# Patient Record
Sex: Male | Born: 1940 | Race: White | Hispanic: No | Marital: Married | Smoking: Former smoker
Health system: Southern US, Community
[De-identification: ages and names within clinical notes are randomized; demographics above are authoritative.]

## PROBLEM LIST (undated history)

## (undated) DIAGNOSIS — R000 Tachycardia, unspecified: Secondary | ICD-10-CM

## (undated) DIAGNOSIS — Q446 Cystic disease of liver: Secondary | ICD-10-CM

## (undated) DIAGNOSIS — I10 Essential (primary) hypertension: Secondary | ICD-10-CM

## (undated) DIAGNOSIS — IMO0001 Reserved for inherently not codable concepts without codable children: Secondary | ICD-10-CM

## (undated) DIAGNOSIS — R160 Hepatomegaly, not elsewhere classified: Secondary | ICD-10-CM

## (undated) DIAGNOSIS — M47816 Spondylosis without myelopathy or radiculopathy, lumbar region: Secondary | ICD-10-CM

## (undated) DIAGNOSIS — M5430 Sciatica, unspecified side: Secondary | ICD-10-CM

## (undated) DIAGNOSIS — Z860100 Personal history of colon polyps, unspecified: Secondary | ICD-10-CM

## (undated) DIAGNOSIS — G4710 Hypersomnia, unspecified: Secondary | ICD-10-CM

## (undated) DIAGNOSIS — J45909 Unspecified asthma, uncomplicated: Secondary | ICD-10-CM

## (undated) DIAGNOSIS — Q613 Polycystic kidney, unspecified: Secondary | ICD-10-CM

## (undated) DIAGNOSIS — K644 Residual hemorrhoidal skin tags: Secondary | ICD-10-CM

## (undated) DIAGNOSIS — E785 Hyperlipidemia, unspecified: Secondary | ICD-10-CM

## (undated) DIAGNOSIS — M722 Plantar fascial fibromatosis: Secondary | ICD-10-CM

## (undated) DIAGNOSIS — E119 Type 2 diabetes mellitus without complications: Secondary | ICD-10-CM

## (undated) DIAGNOSIS — I452 Bifascicular block: Secondary | ICD-10-CM

## (undated) DIAGNOSIS — H9190 Unspecified hearing loss, unspecified ear: Secondary | ICD-10-CM

## (undated) HISTORY — DX: Hyperlipidemia, unspecified: E78.5

## (undated) HISTORY — DX: Essential (primary) hypertension: I10

## (undated) HISTORY — DX: Tachycardia, unspecified: R00.0

## (undated) HISTORY — DX: Type 2 diabetes mellitus without complications: E11.9

## (undated) HISTORY — DX: Bifascicular block: I45.2

## (undated) HISTORY — PX: DENTAL SURGERY: SHX609

## (undated) HISTORY — DX: Hypersomnia, unspecified: G47.10

## (undated) HISTORY — DX: Plantar fascial fibromatosis: M72.2

## (undated) HISTORY — PX: EYE SURGERY: SHX253

## (undated) HISTORY — DX: Unspecified asthma, uncomplicated: J45.909

## (undated) HISTORY — DX: Personal history of colonic polyps: Z86.010

## (undated) HISTORY — DX: Spondylosis without myelopathy or radiculopathy, lumbar region: M47.816

## (undated) HISTORY — PX: CIRCUMCISION: SHX1350

## (undated) HISTORY — DX: Sciatica, unspecified side: M54.30

## (undated) HISTORY — DX: Unspecified hearing loss, unspecified ear: H91.90

## (undated) HISTORY — DX: Reserved for inherently not codable concepts without codable children: IMO0001

## (undated) HISTORY — DX: Residual hemorrhoidal skin tags: K64.4

## (undated) HISTORY — PX: COLONOSCOPY W/ POLYPECTOMY: SHX1380

## (undated) HISTORY — DX: Personal history of colon polyps, unspecified: Z86.0100

## (undated) HISTORY — PX: TRANSTHORACIC ECHOCARDIOGRAM: SHX275

---

## 2008-07-30 DIAGNOSIS — Z860100 Personal history of colon polyps, unspecified: Secondary | ICD-10-CM | POA: Insufficient documentation

## 2010-08-03 ENCOUNTER — Encounter: Payer: Self-pay | Admitting: Otolaryngology

## 2011-05-04 ENCOUNTER — Encounter: Payer: Self-pay | Admitting: Family Medicine

## 2011-05-04 ENCOUNTER — Ambulatory Visit (INDEPENDENT_AMBULATORY_CARE_PROVIDER_SITE_OTHER): Payer: Medicare PPO | Admitting: Family Medicine

## 2011-05-04 ENCOUNTER — Telehealth: Payer: Self-pay | Admitting: Family Medicine

## 2011-05-04 VITALS — BP 157/88 | HR 75 | Temp 98.1°F | Ht 68.5 in | Wt 197.8 lb

## 2011-05-04 DIAGNOSIS — H539 Unspecified visual disturbance: Secondary | ICD-10-CM

## 2011-05-04 DIAGNOSIS — Z23 Encounter for immunization: Secondary | ICD-10-CM

## 2011-05-04 DIAGNOSIS — G4710 Hypersomnia, unspecified: Secondary | ICD-10-CM

## 2011-05-04 DIAGNOSIS — Z125 Encounter for screening for malignant neoplasm of prostate: Secondary | ICD-10-CM

## 2011-05-04 DIAGNOSIS — Z0000 Encounter for general adult medical examination without abnormal findings: Secondary | ICD-10-CM | POA: Insufficient documentation

## 2011-05-04 DIAGNOSIS — I10 Essential (primary) hypertension: Secondary | ICD-10-CM

## 2011-05-04 DIAGNOSIS — R030 Elevated blood-pressure reading, without diagnosis of hypertension: Secondary | ICD-10-CM | POA: Insufficient documentation

## 2011-05-04 HISTORY — DX: Hypersomnia, unspecified: G47.10

## 2011-05-04 MED ORDER — TETANUS-DIPHTH-ACELL PERTUSSIS 5-2.5-18.5 LF-MCG/0.5 IM SUSP: 0.5000 mL | Freq: Once | INTRAMUSCULAR | Status: DC

## 2011-05-04 NOTE — Assessment & Plan Note (Signed)
Reviewed age and gender appropriate health maintenance issues (prudent diet, regular exercise, health risks of tobacco and excessive alcohol, use of seatbelts, fire alarms in home, use of sunscreen).  Also reviewed age and gender appropriate health screening as well as vaccine recommendations. He'll return when fasting for lipid panel, CBC, CMET, TSH, and routine PSA screening test.

## 2011-05-04 NOTE — Patient Instructions (Signed)
Health Maintenance, Males A healthy lifestyle and preventative care can promote health and wellness.  Maintain regular health, dental, and eye exams.   Eat a healthy diet. Foods like vegetables, fruits, whole grains, low-fat dairy products, and lean protein foods contain the nutrients you need without too many calories. Decrease your intake of foods high in solid fats, added sugars, and salt. Get information about a proper diet from your caregiver, if necessary.   Regular physical exercise is one of the most important things you can do for your health. Most adults should get at least 150 minutes of moderate-intensity exercise (any activity that increases your heart rate and causes you to sweat) each week. In addition, most adults need muscle-strengthening exercises on 2 or more days a week.    Maintain a healthy weight. The body mass index (BMI) is a screening tool to identify possible weight problems. It provides an estimate of body fat based on height and weight. Your caregiver can help determine your BMI, and can help you achieve or maintain a healthy weight. For adults 20 years and older:   A BMI below 18.5 is considered underweight.   A BMI of 18.5 to 24.9 is normal.   A BMI of 25 to 29.9 is considered overweight.   A BMI of 30 and above is considered obese.   Maintain normal blood lipids and cholesterol by exercising and minimizing your intake of saturated fat. Eat a balanced diet with plenty of fruits and vegetables. Blood tests for lipids and cholesterol should begin at age 20 and be repeated every 5 years. If your lipid or cholesterol levels are high, you are over 50, or you are a high risk for heart disease, you may need your cholesterol levels checked more frequently.Ongoing high lipid and cholesterol levels should be treated with medicines, if diet and exercise are not effective.   If you smoke, find out from your caregiver how to quit. If you do not use tobacco, do not start.    If you choose to drink alcohol, do not exceed 2 drinks per day. One drink is considered to be 12 ounces (355 mL) of beer, 5 ounces (148 mL) of wine, or 1.5 ounces (44 mL) of liquor.   Avoid use of street drugs. Do not share needles with anyone. Ask for help if you need support or instructions about stopping the use of drugs.   High blood pressure causes heart disease and increases the risk of stroke. Blood pressure should be checked at least every 1 to 2 years. Ongoing high blood pressure should be treated with medicines if weight loss and exercise are not effective.   If you are 45 to 68 years old, ask your caregiver if you should take aspirin to prevent heart disease.   Diabetes screening involves taking a blood sample to check your fasting blood sugar level. This should be done once every 3 years, after age 45, if you are within normal weight and without risk factors for diabetes. Testing should be considered at a younger age or be carried out more frequently if you are overweight and have at least 1 risk factor for diabetes.   Colorectal cancer can be detected and often prevented. Most routine colorectal cancer screening begins at the age of 50 and continues through age 75. However, your caregiver may recommend screening at an earlier age if you have risk factors for colon cancer. On a yearly basis, your caregiver may provide home test kits to check for hidden   blood in the stool. Use of a small camera at the end of a tube, to directly examine the colon (sigmoidoscopy or colonoscopy), can detect the earliest forms of colorectal cancer. Talk to your caregiver about this at age 50, when routine screening begins. Direct examination of the colon should be repeated every 5 to 10 years through age 75, unless early forms of pre-cancerous polyps or small growths are found.   Healthy men should no longer receive prostate-specific antigen (PSA) blood tests as part of routine cancer screening. Consult with  your caregiver about prostate cancer screening.   Practice safe sex. Use condoms and avoid high-risk sexual practices to reduce the spread of sexually transmitted infections (STIs).   Use sunscreen with a sun protection factor (SPF) of 30 or greater. Apply sunscreen liberally and repeatedly throughout the day. You should seek shade when your shadow is shorter than you. Protect yourself by wearing long sleeves, pants, a wide-brimmed hat, and sunglasses year round, whenever you are outdoors.   Notify your caregiver of new moles or changes in moles, especially if there is a change in shape or color. Also notify your caregiver if a mole is larger than the size of a pencil eraser.   A one-time screening for abdominal aortic aneurysm (AAA) and surgical repair of large AAAs by sound wave imaging (ultrasonography) is recommended for ages 65 to 75 years who are current or former smokers.   Stay current with your immunizations.  Document Released: 12/25/2007 Document Revised: 03/10/2011 Document Reviewed: 11/23/2010 [employer] Patient Information 2012 [employer]. 

## 2011-05-04 NOTE — Telephone Encounter (Signed)
Done

## 2011-05-04 NOTE — Progress Notes (Signed)
Office Note 05/05/2011  CC:  Chief Complaint  Patient presents with  . Establish Care    new patient    HPI:  Matthew Ramirez is a 70 y.o. White male who is here to establish care and get CPE. Patient's most recent primary MD: [facility] in [place] Old records were not reviewed prior to or during today's visit.  Pt has not had CPE (or other medical care) in about 3 yrs per his report.  Says after he turned 17 he "got all that medicare stuff", like screening for AAA, various blood tests, colonoscopy, etc.  Says he even saw a cardiologist, although he denies any history of CV dz or sx's. No old records available yet.  Says he feels well.  He reports having odd visual sensations about 6 times in the last few years total: says he has a "kaleidescope" of colors/lights in entire visual field that lasts a few minutes.  No associated sx's, no trigger, no symptoms (such as HA) follows this phenomenon. Thinking is clear during and after.  No hx of CVA/TIA or seizure d/o.  Says he has had multiple high bp readings in the past but these have always been explained away with various excuses and no bp med ever started.  Sounds like he has never had any monitoring/bp check outside of medical setting to establish any more data to support chronic HTN vs labile vs white coat HTN. Admits to hx of snoring and doesn't know if he has any apneic periods.  Has hx of excessive daytime somnolence that he has adjusted to and never thought to look into it medically. Epworth Sleepiness Scale: 10/24.  Past Medical History  Diagnosis Date  . Elevated blood pressure reading without diagnosis of hypertension   . Hearing impairment     Occupational noise damage x years.  Has hearing aids AU  . External hemorrhoids   . History of colon polyps 2009    Past Surgical History  Procedure Date  . Eye surgery     Right (s/p traumatic injury to eye)  . Circumcision age 72  . Colonoscopy 2009    polyps (?type per  pt report)  . Dental surgery     10+ root canals, several bridges    Family History  Problem Relation Age of Onset  . Cancer Mother     leukemia  . Cancer Sister     ovarian    History   Social History  . Marital Status: Married    Spouse Name: N/A    Number of Children: N/A  . Years of Education: N/A   Occupational History  . Not on file.   Social History Main Topics  . Smoking status: Former Smoker -- 1.0 packs/day for 20 years    Types: Cigarettes    Quit date: 07/12/1968  . Smokeless tobacco: Never Used  . Alcohol Use: Yes     a couple of drinks a week  . Drug Use: Not on file  . Sexually Active: Not on file   Other Topics Concern  . Not on file   Social History Narrative   Married, has 4 grown daughters, 15 grandchildren, and 1 great grandchild.Occupation: Radio broadcast assistant.  Originally from [place] [place] [facility] [place], has lived in [place] since age 6 yrs.Distant tobacco abuse: quit 1970.  Rare ETOH intake, no hx of ETOH abuse.  No drug use.Walks about 4 miles per day.   MEDS: Senna once daily No outpatient encounter prescriptions on file as of 05/04/2011.  Facility-Administered Encounter Medications as of 05/04/2011  Medication Dose Route Frequency Provider Last Rate Last Dose  . TDaP (BOOSTRIX) injection 0.5 mL  0.5 mL Intramuscular Once Jeoffrey Massed, MD        No Known Allergies  ROS: as below, plus he complains of less "stamina" than when he was younger Review of Systems  Constitutional: Negative for fever, chills, appetite change and fatigue.  HENT: Negative for ear pain, congestion, sore throat, neck stiffness and dental problem.   Eyes: Positive for visual disturbance (as per HPI). Negative for discharge and redness.  Respiratory: Negative for cough, chest tightness, shortness of breath and wheezing.   Cardiovascular: Negative for chest pain, palpitations and leg swelling.  Gastrointestinal: Positive for constipation and anal bleeding  (occasional BRBPR, dx'd with hemorrhoids in the past--much improved with daily senna). Negative for nausea, vomiting, abdominal pain, diarrhea and blood in stool.  Genitourinary: Negative for dysuria, urgency, frequency, hematuria, flank pain and difficulty urinating.       Erectile dysfunction  Musculoskeletal: Negative for myalgias, back pain, joint swelling and arthralgias.  Skin: Negative for pallor and rash.  Neurological: Negative for dizziness, speech difficulty, weakness and headaches.  Hematological: Negative for adenopathy. Does not bruise/bleed easily.  Psychiatric/Behavioral: Negative for confusion, sleep disturbance and dysphoric mood. The patient is not nervous/anxious.     PE; Blood pressure 157/88, pulse 75, temperature 98.1 F (36.7 C), temperature source Oral, height 5' 8.5" (1.74 m), weight 197 lb 12.8 oz (89.721 kg), SpO2 94.00%. Gen: Alert, well appearing.  Patient is oriented to person, place, time, and situation. HEENT: Scalp without lesions or hair loss.  Ears: EACs clear, normal epithelium.  TMs with good light reflex and landmarks bilaterally.  Eyes: no injection, icteris, swelling, or exudate.  EOMI, pupils reactive bilat.  Right pupil is elliptical shaped (approx 3mm wide x 5 mm high) and left pupil is normal/round and 2 mm in size (this is the result of his past eye trauma and surgery). Nose: no drainage or turbinate edema/swelling.  No injection or focal lesion.  Mouth: lips without lesion/swelling.  Oral mucosa pink and moist.  Dentition intact and without obvious caries or gingival swelling.  Oropharynx without erythema, exudate, or swelling.  necessary Chest: symmetric expansion, nonlabored respirations.  Clear and equal breath sounds in all lung fields.   CV: RRR, no m/r/g.  Peripheral pulses 2+ and symmetric. ABD: soft, NT, ND, BS normal.  No hepatospenomegaly or mass.  No bruits. EXT: no clubbing, cyanosis, or edema.  Genitals normal; both testes normal  without tenderness, masses, hydroceles, varicoceles, erythema or swelling. Shaft normal, circumcised, meatus normal without discharge. No inguinal hernia noted. No inguinal lymphadenopathy. Rectal exam: negative without mass, lesions or tenderness.  Pertinent labs:  None today  ASSESSMENT AND PLAN:   New pt: obtain old records.  Health maintenance examination Reviewed age and gender appropriate health maintenance issues (prudent diet, regular exercise, health risks of tobacco and excessive alcohol, use of seatbelts, fire alarms in home, use of sunscreen).  Also reviewed age and gender appropriate health screening as well as vaccine recommendations. He'll return when fasting for lipid panel, CBC, CMET, TSH, and routine PSA screening test.  Excessive somnolence disorder Arrange sleep study in sleep lab to eval further for OSA.  Elevated blood pressure reading without diagnosis of hypertension Discussed need to do monitoring of bp outside of medical setting.  Discussed buying home bp cuff vs getting bp checked at local pharmacy a few times per week.  Review bps with me at office visit in 2 wks.  Visual disturbance Could be related to past eye trauma but also wonder about this being TIA or occipital lobe seizures. Discussed with patient today and decided that we'll likely ask neuro to see him in the future but will hold off for now. Call or return if this becomes more frequent, longer in duration, or starts having other symptoms associated with it.     Return in about 2 weeks (around 05/18/2011), or o/v for f/u High bp reading, for lab visit for fasting blood work (already ordered) any time in the next 1 wk.

## 2011-05-04 NOTE — Telephone Encounter (Signed)
Pls request records from [facility].  Thx--PM

## 2011-05-05 ENCOUNTER — Other Ambulatory Visit: Payer: Self-pay | Admitting: Family Medicine

## 2011-05-05 DIAGNOSIS — H539 Unspecified visual disturbance: Secondary | ICD-10-CM | POA: Insufficient documentation

## 2011-05-05 NOTE — Assessment & Plan Note (Signed)
Arrange sleep study in sleep lab to eval further for OSA.

## 2011-05-05 NOTE — Progress Notes (Signed)
A user error has taken place: encounter opened in error, closed for administrative reasons.

## 2011-05-05 NOTE — Assessment & Plan Note (Signed)
Could be related to past eye trauma but also wonder about this being TIA or occipital lobe seizures. Discussed with patient today and decided that we'll likely ask neuro to see him in the future but will hold off for now. Call or return if this becomes more frequent, longer in duration, or starts having other symptoms associated with it.

## 2011-05-05 NOTE — Assessment & Plan Note (Signed)
Discussed need to do monitoring of bp outside of medical setting.  Discussed buying home bp cuff vs getting bp checked at local pharmacy a few times per week. Review bps with me at office visit in 2 wks.

## 2011-05-06 NOTE — Progress Notes (Signed)
Addended by: Andrew Au on: 05/06/2011 01:14 PM   Modules accepted: Orders

## 2011-05-10 ENCOUNTER — Ambulatory Visit (HOSPITAL_BASED_OUTPATIENT_CLINIC_OR_DEPARTMENT_OTHER): Payer: Medicare PPO

## 2011-05-11 ENCOUNTER — Other Ambulatory Visit (INDEPENDENT_AMBULATORY_CARE_PROVIDER_SITE_OTHER): Payer: Medicare PPO

## 2011-05-11 DIAGNOSIS — Z125 Encounter for screening for malignant neoplasm of prostate: Secondary | ICD-10-CM

## 2011-05-11 DIAGNOSIS — Z23 Encounter for immunization: Secondary | ICD-10-CM

## 2011-05-11 DIAGNOSIS — Z0000 Encounter for general adult medical examination without abnormal findings: Secondary | ICD-10-CM

## 2011-05-11 DIAGNOSIS — I10 Essential (primary) hypertension: Secondary | ICD-10-CM

## 2011-05-11 LAB — COMPREHENSIVE METABOLIC PANEL WITH GFR
ALT: 26 U/L (ref 0–53)
AST: 23 U/L (ref 0–37)
Albumin: 4.1 g/dL (ref 3.5–5.2)
Alkaline Phosphatase: 78 U/L (ref 39–117)
BUN: 17 mg/dL (ref 6–23)
CO2: 29 meq/L (ref 19–32)
Calcium: 9 mg/dL (ref 8.4–10.5)
Chloride: 102 meq/L (ref 96–112)
Creatinine, Ser: 0.6 mg/dL (ref 0.4–1.5)
GFR: 142.22 mL/min (ref >60.00)
Glucose, Bld: 128 mg/dL — ABNORMAL HIGH (ref 70–99)
Potassium: 4.3 meq/L (ref 3.5–5.1)
Sodium: 138 meq/L (ref 135–145)
Total Bilirubin: 0.7 mg/dL (ref 0.3–1.2)
Total Protein: 7 g/dL (ref 6.0–8.3)

## 2011-05-11 LAB — LIPID PANEL
Cholesterol: 199 mg/dL (ref 0–200)
HDL: 45.9 mg/dL (ref >39.00)
LDL Cholesterol: 130 mg/dL — ABNORMAL HIGH (ref 0–99)
Total CHOL/HDL Ratio: 4
Triglycerides: 114 mg/dL (ref 0.0–149.0)
VLDL: 22.8 mg/dL (ref 0.0–40.0)

## 2011-05-11 LAB — CBC WITH DIFFERENTIAL/PLATELET
Basophils Absolute: 0 10*3/uL (ref 0.0–0.1)
HCT: 44.5 % (ref 39.0–52.0)
Hemoglobin: 14.9 g/dL (ref 13.0–17.0)
Lymphs Abs: 1.6 10*3/uL (ref 0.7–4.0)
MCV: 92.4 fl (ref 78.0–100.0)
Monocytes Absolute: 0.6 10*3/uL (ref 0.1–1.0)
Neutro Abs: 4.6 10*3/uL (ref 1.4–7.7)
Platelets: 162 10*3/uL (ref 150.0–400.0)
RDW: 13.7 % (ref 11.5–14.6)

## 2011-05-11 LAB — TSH: TSH: 1.8 u[IU]/mL (ref 0.35–5.50)

## 2011-05-11 LAB — PSA: PSA: 0.55 ng/mL (ref 0.10–4.00)

## 2011-05-12 ENCOUNTER — Ambulatory Visit (HOSPITAL_BASED_OUTPATIENT_CLINIC_OR_DEPARTMENT_OTHER): Payer: Medicare PPO | Attending: Family Medicine

## 2011-05-12 ENCOUNTER — Encounter: Payer: Self-pay | Admitting: Family Medicine

## 2011-05-12 ENCOUNTER — Other Ambulatory Visit: Payer: Self-pay | Admitting: Family Medicine

## 2011-05-12 DIAGNOSIS — G4733 Obstructive sleep apnea (adult) (pediatric): Secondary | ICD-10-CM | POA: Insufficient documentation

## 2011-05-13 DIAGNOSIS — E119 Type 2 diabetes mellitus without complications: Secondary | ICD-10-CM

## 2011-05-13 HISTORY — DX: Type 2 diabetes mellitus without complications: E11.9

## 2011-05-13 LAB — HEMOGLOBIN A1C: Hgb A1c MFr Bld: 6.9 % — ABNORMAL HIGH (ref 4.6–6.5)

## 2011-05-18 ENCOUNTER — Ambulatory Visit (INDEPENDENT_AMBULATORY_CARE_PROVIDER_SITE_OTHER): Payer: Medicare PPO | Admitting: Family Medicine

## 2011-05-18 ENCOUNTER — Encounter: Payer: Self-pay | Admitting: Family Medicine

## 2011-05-18 VITALS — BP 176/88 | HR 74 | Temp 98.3°F | Ht 68.5 in | Wt 198.8 lb

## 2011-05-18 DIAGNOSIS — E785 Hyperlipidemia, unspecified: Secondary | ICD-10-CM | POA: Insufficient documentation

## 2011-05-18 DIAGNOSIS — I10 Essential (primary) hypertension: Secondary | ICD-10-CM | POA: Insufficient documentation

## 2011-05-18 DIAGNOSIS — E119 Type 2 diabetes mellitus without complications: Secondary | ICD-10-CM

## 2011-05-18 MED ORDER — LISINOPRIL 5 MG PO TABS: 5.0000 mg | ORAL_TABLET | Freq: Every day | ORAL | Status: DC

## 2011-05-18 MED ORDER — ONETOUCH DELICA LANCETS MISC: Status: AC

## 2011-05-18 NOTE — Assessment & Plan Note (Signed)
Lab Results  Component Value Date   CHOL 199 05/11/2011   HDL 45.90 05/11/2011   LDLCALC 130* 05/11/2011   TRIG 114.0 05/11/2011   CHOLHDL 4 05/11/2011   Goal LDL <100.  He is a bit shell shocked about recent medical dx and was already hesitant to resort to meds for treatment of his cholesterol.  We'll have to re-address this at future visits and I'll be recommending a statin unless his lifestyle changes make a remarkable impact.Marland Kitchen

## 2011-05-18 NOTE — Progress Notes (Signed)
OFFICE NOTE  05/18/2011  CC:  Chief Complaint  Patient presents with  . Hypertension    follow up     HPI:   Patient is a 70 y.o. Caucasian male who is here for 2 wk f/u HTN, plus labs recently showed HbA1c 6.9%. Approx 7 bp [facility] since [facility] visit show some bps high normal and some in mild HTN range  Has checked fasting glucoses last 3 mornings and they have all been 130s-140s. He feels good, no acute complaints. Says he is frustrated with recent dx of HTN and DM 2 but is motivated to do whatever it takes to get things controlled, including nutritionist referral and starting regular exercise.  Pertinent PMH:  Past Medical History  Diagnosis Date  . Elevated blood pressure reading without diagnosis of hypertension   . Hearing impairment     Occupational noise damage x years.  Has hearing aids AU  . External hemorrhoids   . History of colon polyps 2009  . DM (diabetes mellitus) 05/2011    MEDS;  NONE No outpatient prescriptions prior to visit.    PE: Blood pressure 176/88, pulse 74, temperature 98.3 F (36.8 C), temperature source Oral, height 5' 8.5" (1.74 m), weight 198 lb 12.8 oz (90.175 kg), SpO2 92.00%. Recheck of bp in exam room, right arm: 162/88.   Gen: Alert, well appearing.  Patient is oriented to person, place, time, and situation. No further exam today.  LABS:  Lab Results  Component Value Date   TSH 1.80 05/11/2011   Lab Results  Component Value Date   WBC 7.2 05/11/2011   HGB 14.9 05/11/2011   HCT 44.5 05/11/2011   MCV 92.4 05/11/2011   PLT 162.0 05/11/2011   Lab Results  Component Value Date   CREATININE 0.6 05/11/2011   BUN 17 05/11/2011   NA 138 05/11/2011   K 4.3 05/11/2011   CL 102 05/11/2011   CO2 29 05/11/2011   Lab Results  Component Value Date   ALT 26 05/11/2011   AST 23 05/11/2011   ALKPHOS 78 05/11/2011   BILITOT 0.7 05/11/2011   Lab Results  Component Value Date   CHOL 199 05/11/2011   Lab  Results  Component Value Date   HDL 45.90 05/11/2011   Lab Results  Component Value Date   LDLCALC 130* 05/11/2011   Lab Results  Component Value Date   TRIG 114.0 05/11/2011   Lab Results  Component Value Date   CHOLHDL 4 05/11/2011   Lab Results  Component Value Date   PSA 0.55 05/11/2011   Lab Results  Component Value Date   HGBA1C 6.9* 05/11/2011       IMPRESSION AND PLAN:  DM (diabetes mellitus) Discussed dx, he's letting it sink in a bit. He agreed to a nutritionist referral.  No meds at this time. We'll have ongoing discussion about regular physical exercise, appropriate wt loss.   Discussed monitoring: check bid for now--one fasting and one 2H PP daily. In future f/u's we'll go over the routine monitoring for diabetics like eye exams, HbA1c's, foot exams, urine protein checks.  HTN (hypertension), benign Start lisinopril 5mg  qd.  Therapeutic expectations and side effect profile of medication discussed today.  Patient's questions answered. Continue to monitor bp when he can.  Goal bp <130/80.  Hyperlipidemia Lab Results  Component Value Date   CHOL 199 05/11/2011   HDL 45.90 05/11/2011   LDLCALC 130* 05/11/2011   TRIG 114.0 05/11/2011   CHOLHDL  4 05/11/2011   Goal LDL <100.  He is a bit shell shocked about recent medical dx and was already hesitant to resort to meds for treatment of his cholesterol.  We'll have to re-address this at future visits and I'll be recommending a statin unless his lifestyle changes make a remarkable impact..     FOLLOW UP:  Return in about 2 weeks (around 06/01/2011) for f/u HTN and DM 2.

## 2011-05-18 NOTE — Assessment & Plan Note (Signed)
Start lisinopril 5mg  qd.  Therapeutic expectations and side effect profile of medication discussed today.  Patient's questions answered. Continue to monitor bp when he can.  Goal bp <130/80.

## 2011-05-18 NOTE — Assessment & Plan Note (Signed)
Discussed dx, he's letting it sink in a bit. He agreed to a nutritionist referral.  No meds at this time. We'll have ongoing discussion about regular physical exercise, appropriate wt loss.   Discussed monitoring: check bid for now--one fasting and one 2H PP daily. In future f/u's we'll go over the routine monitoring for diabetics like eye exams, HbA1c's, foot exams, urine protein checks.

## 2011-05-20 ENCOUNTER — Encounter: Payer: Medicare PPO | Attending: Family Medicine | Admitting: *Deleted

## 2011-05-20 ENCOUNTER — Encounter: Payer: Self-pay | Admitting: *Deleted

## 2011-05-20 DIAGNOSIS — Z9884 Bariatric surgery status: Secondary | ICD-10-CM | POA: Insufficient documentation

## 2011-05-20 DIAGNOSIS — Z713 Dietary counseling and surveillance: Secondary | ICD-10-CM | POA: Insufficient documentation

## 2011-05-20 DIAGNOSIS — E119 Type 2 diabetes mellitus without complications: Secondary | ICD-10-CM | POA: Insufficient documentation

## 2011-05-20 DIAGNOSIS — I10 Essential (primary) hypertension: Secondary | ICD-10-CM

## 2011-05-20 NOTE — Patient Instructions (Signed)
Goals:  Follow Diabetes Meal Plan as instructed  Eat 2 meals and 1-2 snacks daily  Limit carbohydrate intake to 45-60 grams carbohydrate/meal  Include lean protein foods to meals/snacks  Monitor glucose levels as instructed by your doctor  Aim for 30 mins of physical activity daily  Bring food record and glucose log to your next nutrition visit

## 2011-05-20 NOTE — Progress Notes (Signed)
  Medical Nutrition Therapy:  Appt start time: 0830 end time:  0930.   Assessment:  Primary concerns today: Nutrition counseling for newly diagnosed Type 2 diabetes and HTN. Patient states he is still quite upset with these diagnosis as he has never had anything wrong with him and takes no Rx medications. He normally eats 2 meals a day and is active throughout the day.   MEDICATIONS: Lisinopril, garlic pill and laxative   DIETARY INTAKE:  Usual eating pattern includes 2 meals and no snacks per day.  Everyday foods include good variety of all food groups.  Avoided foods include canned foods, desserts and salt at the table.    24-hr recall:  B ( AM): eggs, cheese, meat. No starch.  Snk ( AM): none  L ( PM): skips Snk ( PM): none D ( PM): lean meat, vegetables, dried beans. Occasional beer Snk ( PM): none Beverages: black coffee, water, beer at supper  Usual physical activity: yard work, active lifestyle  Estimated energy needs: 1600 calories 180 g carbohydrates 120 g protein 44 g fat  Progress Towards Goal(s):  In progress.   Nutritional Diagnosis:  [place]-3.3 Overweight/obesity As related to new diagnosis of Type 2 diabetes.  As evidenced by BMI of 29.1.    Intervention:  Nutrition counseling and diabetes self management provided with explanation of physiology of diabetes, factors affecting BG, value of consistent carb intake and self monitoring of his BG both pre and post meal.  Handouts given during visit include:  Living with Type 2 Diabetes by ADA  Carb Counting and Beyond handout  Monitoring/Evaluation:  Dietary intake, exercise, self monitoring of his BG, and body weight prn.

## 2011-06-01 ENCOUNTER — Telehealth: Payer: Self-pay | Admitting: Family Medicine

## 2011-06-01 ENCOUNTER — Encounter: Payer: Self-pay | Admitting: Family Medicine

## 2011-06-01 ENCOUNTER — Ambulatory Visit (INDEPENDENT_AMBULATORY_CARE_PROVIDER_SITE_OTHER): Payer: Medicare PPO | Admitting: Family Medicine

## 2011-06-01 VITALS — BP 156/80 | HR 52 | Ht 68.5 in | Wt 194.0 lb

## 2011-06-01 DIAGNOSIS — S43429A Sprain of unspecified rotator cuff capsule, initial encounter: Secondary | ICD-10-CM

## 2011-06-01 DIAGNOSIS — S46019A Strain of muscle(s) and tendon(s) of the rotator cuff of unspecified shoulder, initial encounter: Secondary | ICD-10-CM | POA: Insufficient documentation

## 2011-06-01 DIAGNOSIS — I10 Essential (primary) hypertension: Secondary | ICD-10-CM

## 2011-06-01 DIAGNOSIS — G4733 Obstructive sleep apnea (adult) (pediatric): Secondary | ICD-10-CM

## 2011-06-01 DIAGNOSIS — E119 Type 2 diabetes mellitus without complications: Secondary | ICD-10-CM

## 2011-06-01 NOTE — Assessment & Plan Note (Signed)
Mild, R>L. Discussed relative rest, avoid prolonged abduction position (esp when sleeping). Do gentle ROM to keep from getting frozen shoulder. If no improvement with the topical preparation I rx'd for him today (3% diclofenac, 2% baclofen, 1% bupivicaine, 6% gabapentin, apply 1-2g to affected area 3-4 times per day, he can return and I'll offer intraarticular steriod injection and/or PT.

## 2011-06-01 NOTE — Telephone Encounter (Signed)
Patient wanted to check to see if he could pick up the anti-inflammatory cream Rx

## 2011-06-01 NOTE — Assessment & Plan Note (Addendum)
Doing fine with things so far: continue qd monitoring of gluc the best he can. Continue to adjust diet, get more physically active.  Recheck HbA1c 57mo. I asked him to call his ophthalmologist, Dr. Nile Riggs, to arrange his first diab retin screening exam between now and next f/u in 57mo. Foot exam today was pretty normal.  He has a bit of diminished sensation in both heels where he has some callus build up.

## 2011-06-01 NOTE — Progress Notes (Signed)
OFFICE NOTE  06/01/2011  CC:  Chief Complaint  Patient presents with  . Diabetes    follow up  . Hypertension    follow up     HPI:   Patient is a 70 y.o. Caucasian male who is here for f/u DM 2 and HTN. Has been on lisinopril 5mg  x 2 wks.  Home/pharmacy bp checks consistently <140/90. NO side effects from med. Checks fasting gluc most mornings and gets 130s-150s usually.  He went to the nutritionist and did find this helpful. Plans on starting exercising soon, too. He is determined to try his hardest to control things well enough with lifestyle mod in order to avoid diabetes meds. Denies pain, tingling, or numbness in feet.  Has some callus and a few plantar warts on right heel, bother him when walking on them. Asks for something to be done about them if possible. Discussed some of the routine diabetic monitoring: HbA1c 4 times per year avg, annual foot exams, annual diab retin screening exam, annual urine microalb.  Pt c/o a few weeks of right>left shoulder/upper arm pain/ache ("ever since I got that tetanus shot here".  He is a Nutritional therapist, does repetitive overhead reaching frequently, also sleeps with arm in abduction much of the time.  Present some at rest but made worse when abducting his arms (esp between 30 and 120 degrees).  No neck pain, no shooting pain down arms and no paresthesias.  No hx of shoulder or neck trauma.  No hx of shoulder x-ray in past or shoulder injection in the past.  Has been taking a homeopathic type med called Joint Advantage lately, unsure if helping or not.  Intensity of pain is 2-3/10 most of the time, with occasional twinges of 8/10 intensity.  He declined the flu vaccine today.    Pertinent PMH:  DM 2 HTN Hx of hyperlipidemia Excessive somnolence disorder: sleep study (in lab) done approx 2 wks ago and no result received yet.  MEDS;   Outpatient Prescriptions Prior to Visit  Medication Sig Dispense Refill  . lisinopril (PRINIVIL,ZESTRIL) 5 MG  tablet Take 1 tablet (5 mg total) by mouth daily.  30 tablet  1  . ONETOUCH DELICA LANCETS MISC Check glucose twice daily  60 each  5    PE: Blood pressure 156/80, pulse 52, height 5' 8.5" (1.74 m), weight 194 lb (87.998 kg). Gen: Alert, well appearing.  Patient is oriented to person, place, time, and situation. Feet: full diabetic foot exam done.  Feet are normal except some callus on heels and the right heel has 3 distinct areas of hyperkeratotic papules most c/w plantar warts.  No erythema or tenderness. Shoulders: no deformity.  Pain with abduction from 30 deg to 120 deg or so, negative drop sign.  ER/IR not painful.  Strength 5/5 prox and dist in both LE's  DTRs 1+ in biceps and triceps. No tenderness to palpation anywhere along shoulder girdle on either side.  IMPRESSION AND PLAN:  DM (diabetes mellitus) Doing fine with things so far: continue qd monitoring of gluc the best he can. Continue to adjust diet, get more physically active.  Recheck HbA1c 76mo. I asked him to call his ophthalmologist, Dr. Nile Riggs, to arrange his first diab retin screening exam between now and next f/u in 76mo. Foot exam today was pretty normal.  He has a bit of diminished sensation in both heels where he has some callus build up.  HTN (hypertension), benign Problem stable.  Continue current medications and diet  appropriate for this condition.  We have reviewed our general long term plan for this problem and also reviewed symptoms and signs that should prompt the patient to call or return to the office.   Rotator cuff strain Mild, R>L. Discussed relative rest, avoid prolonged abduction position (esp when sleeping). Do gentle ROM to keep from getting frozen shoulder. If no improvement with the topical preparation I rx'd for him today (3% diclofenac, 2% baclofen, 1% bupivicaine, 6% gabapentin, apply 1-2g to affected area 3-4 times per day, he can return and I'll offer intraarticular steriod injection and/or  PT.      FOLLOW UP:  Return for f/u dm 2, HTN, rotator cuff.

## 2011-06-01 NOTE — Assessment & Plan Note (Signed)
Problem stable.  Continue current medications and diet appropriate for this condition.  We have reviewed our general long term plan for this problem and also reviewed symptoms and signs that should prompt the patient to call or return to the office.  

## 2011-06-02 NOTE — Telephone Encounter (Signed)
Advised RX is through mail order company.  Pt states they called him last night and everything is in process.  No further questions.

## 2011-06-06 NOTE — Procedures (Addendum)
NAME:  Matthew Ramirez, Matthew Ramirez NO.:  [id number]  MEDICAL RECORD NO.:  [id number]         PATIENT TYPE:  OUT  LOCATION:  SLEEP CENTER                 FACILITY:  [facility]  PHYSICIAN:  Barbaraann Share, MD,FCCPDATE OF BIRTH:  [birth year] 05, 1941  DATE OF STUDY:  05/12/2011                           NOCTURNAL POLYSOMNOGRAM  REFERRING PHYSICIAN:  Jeoffrey Massed, MD  INDICATION FOR STUDY:  Hypersomnia with sleep apnea.  EPWORTH SLEEPINESS SCORE:  10.  MEDICATIONS:  SLEEP ARCHITECTURE:  The patient had a total sleep time of 320 minutes, with decreased slow wave sleep and 83 minutes of REM, which is at the lower limits of normal.  Sleep onset latency was normal at 5 minutes, and REM onset was normal at 71 minutes.  Sleep efficiency was mildly reduced at 89%.  RESPIRATORY DATA:  The patient was found to have 5 obstructive apneas and 42 obstructive hypopneas during the night, giving him an apnea- hypopnea index of 9 events per hour over the entire study.  The events were increased during REM, but were not positional.  There was moderate to loud snoring noted throughout.  The patient did not meet split night protocol, secondary to the majority of his events occurring well after 1 a.m.  OXYGEN DATA:  There was transient O2 desaturation as low as 85% with the patient's obstructive events.  CARDIAC DATA:  Occasional PAC noted, but no clinically significant arrhythmias were seen.  MOVEMENT-PARASOMNIA:  The patient had no significant leg jerks or other abnormal behaviors noted.  IMPRESSIONS-RECOMMENDATIONS:  Mild obstructive sleep apnea/hypopnea syndrome with an AHI of 9 events per hour, and O2 desaturation transiently as low as 85%.  Treatment for this degree of sleep apnea can include a trial of weight loss alone, upper airway surgery, dental appliance, and also CPAP.  Because of the mild nature of his sleep apnea, the decision to treat should be based on its impact to his quality  of life.  Clinical correlation is suggested.     Barbaraann Share, MD,FCCP Diplomate, American Board of Sleep Medicine Electronically Signed    [facility]/MED[facility]  D:  06/01/2011 08:53:22  T:  06/01/2011 09:15:19  Job:  [id number]

## 2011-06-07 ENCOUNTER — Encounter: Payer: Self-pay | Admitting: Family Medicine

## 2011-06-07 ENCOUNTER — Telehealth: Payer: Self-pay | Admitting: Family Medicine

## 2011-06-07 ENCOUNTER — Ambulatory Visit (INDEPENDENT_AMBULATORY_CARE_PROVIDER_SITE_OTHER): Payer: Medicare PPO | Admitting: Family Medicine

## 2011-06-07 VITALS — BP 138/75 | HR 64 | Ht 68.5 in | Wt 195.0 lb

## 2011-06-07 DIAGNOSIS — I10 Essential (primary) hypertension: Secondary | ICD-10-CM

## 2011-06-07 DIAGNOSIS — B070 Plantar wart: Secondary | ICD-10-CM

## 2011-06-07 DIAGNOSIS — E119 Type 2 diabetes mellitus without complications: Secondary | ICD-10-CM

## 2011-06-07 DIAGNOSIS — G4710 Hypersomnia, unspecified: Secondary | ICD-10-CM

## 2011-06-07 LAB — MICROALBUMIN / CREATININE URINE RATIO
Creatinine,U: 44.2 mg/dL
Microalb Creat Ratio: 0.7 mg/g (ref 0.0–30.0)

## 2011-06-07 NOTE — Assessment & Plan Note (Addendum)
Diet only at this point. Discussed more educational aspects of the dz since he is a new dx: emphasized carb restriction but NOT elimination, portion control, frequent small meals, continue to monitor glucose 1-2 times per day.  Encouraged pt to start exercise regimen; start slow and work up to 30 min of cardio 5/7 days per week. Exercise effects on glucose discussed.  Discussed diabetic foot care/precautions. Discussed potential effects of diabetes on kidneys, obtained urine for microalb/cr today. He is working on scheduling his first Diab retin screening exam already. I need to get him on one 81mg  ASA qd for primary prevention of CV dz. F/u as already scheduled in a few months.

## 2011-06-07 NOTE — Assessment & Plan Note (Signed)
Shaved callus over these today and then used cryotherapy to freeze 6 plantar warts.  No complications.  Pt tolerated the procedure well. Call or return if not resolving in 2-3 wks and if still causing discomfort with walking.

## 2011-06-07 NOTE — Assessment & Plan Note (Signed)
Problem stable.  Continue current medications and diet appropriate for this condition.  We have reviewed our general long term plan for this problem and also reviewed symptoms and signs that should prompt the patient to call or return to the office.  

## 2011-06-07 NOTE — Assessment & Plan Note (Signed)
Sleep study in sleep lab has been completed and preliminary report from Dr. Shelle Iron was mild OSA but he did not sound convinced that pt needed CPAP at this time.  Full report and recommendations to come.
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
## 2011-06-07 NOTE — Telephone Encounter (Signed)
Pls call patient and tell him I forgot to tell him to start taking one 81mg  ("baby") aspirin once EVERY DAY for prevention of heart disease. Thx-

## 2011-06-08 NOTE — Telephone Encounter (Signed)
Notified.  Added to med list.

## 2011-06-18 ENCOUNTER — Other Ambulatory Visit: Payer: Self-pay | Admitting: *Deleted

## 2011-06-18 MED ORDER — LISINOPRIL 5 MG PO TABS: 5.0000 mg | ORAL_TABLET | Freq: Every day | ORAL | Status: DC

## 2011-06-18 NOTE — Telephone Encounter (Signed)
Last seen 06/07/11, follow up on 09/01/11.  90 x 0 sent to pharm.

## 2011-07-15 ENCOUNTER — Encounter: Payer: Self-pay | Admitting: Family Medicine

## 2011-07-15 ENCOUNTER — Ambulatory Visit (INDEPENDENT_AMBULATORY_CARE_PROVIDER_SITE_OTHER): Payer: Medicare Other | Admitting: Family Medicine

## 2011-07-15 DIAGNOSIS — M25519 Pain in unspecified shoulder: Secondary | ICD-10-CM

## 2011-07-15 DIAGNOSIS — S46819A Strain of other muscles, fascia and tendons at shoulder and upper arm level, unspecified arm, initial encounter: Secondary | ICD-10-CM

## 2011-07-15 DIAGNOSIS — I10 Essential (primary) hypertension: Secondary | ICD-10-CM

## 2011-07-15 DIAGNOSIS — M75100 Unspecified rotator cuff tear or rupture of unspecified shoulder, not specified as traumatic: Secondary | ICD-10-CM

## 2011-07-15 MED ORDER — METHYLPREDNISOLONE ACETATE PF 40 MG/ML IJ SUSP
40.0000 mg | Freq: Once | INTRAMUSCULAR | Status: AC
  Administered 2011-07-15: 40 mg via INTRA_ARTICULAR

## 2011-07-15 NOTE — Progress Notes (Signed)
OFFICE NOTE  07/15/2011  CC:  Chief Complaint  Patient presents with  . Follow-up    shoulder pain, cream not working, would like injection     HPI: Patient is a 71 y.o. Caucasian male who is here for right>left shoulder pain. Has been bothering him > 30mo, worsening, impairing sleep, describes severe ache: intensity 5-8/10 on right and 3-5/10 on left. Says the topical med I rx'd him last visit has made no difference.  He has not rested his shoulders.  Has never had an injection of steroid in joint but wants one today if possible.  No other joints giving him any significant problems. No trauma to shoulders, no UE weakness except sometimes in right arm due to pain with abduction movement.   Says home bp checks have been normal--we reviewed his log of this that he's done while being on 5mg  lisinopril qd.  Pertinent PMH:  DM HTN Rotator cuff strain Distant hx of tobacco abuse  Pertinent Meds: Compounded topical med for right shoulder lately, lisinopril 5mg  qd,   PE: Blood pressure 139/79, pulse 70, temperature 98.6 F (37 C), temperature source Temporal, weight 198 lb (89.812 kg). Gen: Alert, well appearing.  Patient is oriented to person, place, time, and situation. Neck: nontender.  ROM full. Shoulders without any palpable tenderness.  No deformity.  No erythema or rash. ER/IR/flexion/extension are intact without pain or weakness. ABduction elicits pain in the arc from 30 to 120 degrees, with nearly a positive drop sign on the right.  IMPRESSION AND PLAN:  1) Right > left shoulder pain, likely supraspinatous tendonitis on left and possibly a supraspinatous tear on the right. Plan options discussed.  Refer to ortho for right shoulder. Will inject left shoulder.  Procedure: Therapeutic shoulder injection -LEFT SHOULDER.  The patient's clinical condition is marked by substantial pain and/or significant functional disability.  Other conservative therapy has not provided relief, is  contraindicated, or not appropriate.  There is a reasonable likelihood that injection will significantly improve the patient's pain and/or functional disability. Cleaned skin with alcohol swab, used posterolateral approach, Injected 1ml of 40mg /ml depomedrol and 2 cc of 2% lidocaine without epinephrine into subacromial space without resistance.  No immediate complications.  Patient tolerated procedure well.  Post-injection care discussed, including 20 min of icing 1-2 times in the next 4-8 hours, frequent non weight-bearing ROM exercises over the next few days, and general pain medication management.  2) HTN: Problem stable.  Continue current medications and diet appropriate for this condition.  We have reviewed our general long term plan for this problem and also reviewed symptoms and signs that should prompt the patient to call or return to the office.  FOLLOW UP: 3-4 mo for HTN f/u.

## 2011-09-01 ENCOUNTER — Ambulatory Visit: Payer: Medicare PPO | Admitting: Family Medicine

## 2011-09-17 ENCOUNTER — Other Ambulatory Visit: Payer: Self-pay | Admitting: *Deleted

## 2011-09-17 MED ORDER — LISINOPRIL 5 MG PO TABS: 5.0000 mg | ORAL_TABLET | Freq: Every day | ORAL | Status: DC

## 2011-09-17 NOTE — Telephone Encounter (Signed)
Faxed refill request received from pharmacy.  Last seen on 07/2011, follow up needed in 3-4 months.  RX sent.

## 2011-11-15 ENCOUNTER — Ambulatory Visit (INDEPENDENT_AMBULATORY_CARE_PROVIDER_SITE_OTHER): Payer: Medicare Other | Admitting: Family Medicine

## 2011-11-15 ENCOUNTER — Encounter: Payer: Self-pay | Admitting: Family Medicine

## 2011-11-15 VITALS — BP 125/82 | HR 72 | Ht 68.5 in | Wt 192.0 lb

## 2011-11-15 DIAGNOSIS — M67919 Unspecified disorder of synovium and tendon, unspecified shoulder: Secondary | ICD-10-CM

## 2011-11-15 DIAGNOSIS — S43429A Sprain of unspecified rotator cuff capsule, initial encounter: Secondary | ICD-10-CM

## 2011-11-15 DIAGNOSIS — I10 Essential (primary) hypertension: Secondary | ICD-10-CM

## 2011-11-15 DIAGNOSIS — M7581 Other shoulder lesions, right shoulder: Secondary | ICD-10-CM

## 2011-11-15 DIAGNOSIS — E119 Type 2 diabetes mellitus without complications: Secondary | ICD-10-CM

## 2011-11-15 DIAGNOSIS — S46019A Strain of muscle(s) and tendon(s) of the rotator cuff of unspecified shoulder, initial encounter: Secondary | ICD-10-CM

## 2011-11-15 LAB — BASIC METABOLIC PANEL
CO2: 26 mEq/L (ref 19–32)
Calcium: 9.4 mg/dL (ref 8.4–10.5)
Creat: 0.71 mg/dL (ref 0.50–1.35)

## 2011-11-15 LAB — HEMOGLOBIN A1C
Hgb A1c MFr Bld: 6.4 % — ABNORMAL HIGH (ref <5.7)
Mean Plasma Glucose: 137 mg/dL — ABNORMAL HIGH (ref <117)

## 2011-11-15 MED ORDER — METHYLPREDNISOLONE ACETATE 40 MG/ML IJ SUSP
40.0000 mg | Freq: Once | INTRAMUSCULAR | Status: AC
  Administered 2011-11-15: 40 mg via INTRA_ARTICULAR

## 2011-11-15 NOTE — Assessment & Plan Note (Signed)
Home glucoses fair but not ideal, plus he's checking only a fasting number. Working on diet.  Needs to increase activity/exercise. He has his first diab retinpthy screen exam with Dr. Nile Riggs at the end of this month. Check HbA1c today.

## 2011-11-15 NOTE — Assessment & Plan Note (Signed)
Tendonitis bilaterally, with last injection in left shoulder about 61mo ago and last injection in right shoulder about 1 wk later at the orthopedist's.  Procedure: Therapeutic shoulder injection (right and left).  The patient's clinical condition is marked by substantial pain and/or significant functional disability.  Other conservative therapy has not provided relief, is contraindicated, or not appropriate.  There is a reasonable likelihood that injection will significantly improve the patient's pain and/or functional disability. Cleaned skin with alcohol swab, used posterolateral approach, Injected 1 ml of depo-medrol 40mg /ml +65ml of 2% lidocaine w/out epi into subacromial space without resistance.  No immediate complications.  Patient tolerated procedure well.  Post-injection care discussed, including 20 min of icing 1-2 times in the next 4-8 hours, frequent non weight-bearing ROM exercises over the next few days, and general pain medication management. Discussed limited prn use of celebrex 200mg  qd with food in the future if things begin to flare up again--avoid ibuprofen or alleve OTC.

## 2011-11-15 NOTE — Assessment & Plan Note (Signed)
Problem stable.  Continue current medications and diet appropriate for this condition.  We have reviewed our general long term plan for this problem and also reviewed symptoms and signs that should prompt the patient to call or return to the office. Check BMET today. 
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
## 2011-12-16 ENCOUNTER — Other Ambulatory Visit: Payer: Self-pay | Admitting: Family Medicine

## 2011-12-16 MED ORDER — LISINOPRIL 5 MG PO TABS: 5.0000 mg | ORAL_TABLET | Freq: Every day | ORAL | Status: DC

## 2011-12-16 NOTE — Telephone Encounter (Signed)
RX sent to pharmacy  

## 2011-12-30 ENCOUNTER — Encounter: Payer: Self-pay | Admitting: *Deleted

## 2011-12-30 ENCOUNTER — Other Ambulatory Visit: Payer: Self-pay | Admitting: *Deleted

## 2012-02-22 ENCOUNTER — Telehealth: Payer: Self-pay | Admitting: *Deleted

## 2012-02-22 MED ORDER — GLUCOSE BLOOD VI STRP: ORAL_STRIP | Status: AC

## 2012-02-22 NOTE — Telephone Encounter (Signed)
Dr Nunzio Cobbs patient can have as needed refills on the test strips.

## 2012-02-22 NOTE — Telephone Encounter (Signed)
Informed this patient by phone that Dr. Milinda Cave approved him to have as needed refills on his test strips.  He thanked me for calling.

## 2012-03-15 ENCOUNTER — Other Ambulatory Visit: Payer: Self-pay | Admitting: *Deleted

## 2012-03-15 MED ORDER — LISINOPRIL 5 MG PO TABS: 5.0000 mg | ORAL_TABLET | Freq: Every day | ORAL | Status: DC

## 2012-03-15 NOTE — Telephone Encounter (Signed)
Faxed refill request received from pharmacy for LISINOPRIL Last filled by MD on 12/16/11, #[phone] X 0 Last seen on 11/15/11 Follow up 4 MONTHS, no appt in system.  Pt needs office visit for more refills.

## 2012-04-10 ENCOUNTER — Encounter: Payer: Self-pay | Admitting: Family Medicine

## 2012-04-10 ENCOUNTER — Ambulatory Visit (INDEPENDENT_AMBULATORY_CARE_PROVIDER_SITE_OTHER): Payer: Medicare Other | Admitting: Family Medicine

## 2012-04-10 VITALS — BP 150/85 | HR 54 | Ht 68.5 in | Wt 189.0 lb

## 2012-04-10 DIAGNOSIS — E119 Type 2 diabetes mellitus without complications: Secondary | ICD-10-CM

## 2012-04-10 DIAGNOSIS — I10 Essential (primary) hypertension: Secondary | ICD-10-CM

## 2012-04-10 DIAGNOSIS — H9190 Unspecified hearing loss, unspecified ear: Secondary | ICD-10-CM

## 2012-04-10 DIAGNOSIS — E785 Hyperlipidemia, unspecified: Secondary | ICD-10-CM

## 2012-04-10 LAB — LIPID PANEL
Cholesterol: 184 mg/dL (ref 0–200)
LDL Cholesterol: 119 mg/dL — ABNORMAL HIGH (ref 0–99)
Total CHOL/HDL Ratio: 4
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
## 2012-04-10 NOTE — Assessment & Plan Note (Signed)
Elevated here today, need home measurements to get a good average to go on. He'll restart home bp checks 3 x/week.

## 2012-04-10 NOTE — Assessment & Plan Note (Signed)
Will recheck lipids today-fasting. However, he says once again that if they are elevated he wants to try 3 months of "walking daily" before trying medication.

## 2012-04-10 NOTE — Assessment & Plan Note (Signed)
[facility] to [facility].

## 2012-04-10 NOTE — Assessment & Plan Note (Signed)
Fair control as per fastings. Needs to exercise. Check HbA1c today.

## 2012-05-03 ENCOUNTER — Encounter: Payer: Self-pay | Admitting: Family Medicine

## 2012-05-10 ENCOUNTER — Encounter: Payer: Self-pay | Admitting: Family Medicine

## 2012-06-13 ENCOUNTER — Other Ambulatory Visit: Payer: Self-pay | Admitting: *Deleted

## 2012-06-13 MED ORDER — LISINOPRIL 5 MG PO TABS: 5.0000 mg | ORAL_TABLET | Freq: Every day | ORAL | Status: DC

## 2012-06-13 NOTE — Telephone Encounter (Signed)
Faxed refill request received from pharmacy for LISINOPRIL Last filled by MD on never by our office Last seen on 04/10/12 Follow up 4 months Refill sent per [facility] refill protocol.

## 2012-08-10 ENCOUNTER — Ambulatory Visit (INDEPENDENT_AMBULATORY_CARE_PROVIDER_SITE_OTHER): Payer: Medicare Other | Admitting: Family Medicine

## 2012-08-10 ENCOUNTER — Encounter: Payer: Self-pay | Admitting: Family Medicine

## 2012-08-10 VITALS — BP 132/74 | HR 50 | Ht 68.5 in | Wt 192.0 lb

## 2012-08-10 DIAGNOSIS — H9190 Unspecified hearing loss, unspecified ear: Secondary | ICD-10-CM

## 2012-08-10 DIAGNOSIS — Z860100 Personal history of colon polyps, unspecified: Secondary | ICD-10-CM

## 2012-08-10 DIAGNOSIS — E785 Hyperlipidemia, unspecified: Secondary | ICD-10-CM

## 2012-08-10 DIAGNOSIS — E119 Type 2 diabetes mellitus without complications: Secondary | ICD-10-CM

## 2012-08-10 DIAGNOSIS — I10 Essential (primary) hypertension: Secondary | ICD-10-CM

## 2012-08-10 DIAGNOSIS — Z860101 Personal history of adenomatous and serrated colon polyps: Secondary | ICD-10-CM

## 2012-08-10 LAB — LIPID PANEL
Cholesterol: 174 mg/dL (ref 0–200)
Total CHOL/HDL Ratio: 4
Triglycerides: 96 mg/dL (ref 0.0–149.0)

## 2012-08-10 LAB — MICROALBUMIN / CREATININE URINE RATIO: Microalb Creat Ratio: 0.4 mg/g (ref 0.0–30.0)

## 2012-08-10 LAB — COMPREHENSIVE METABOLIC PANEL
ALT: 23 U/L (ref 0–53)
CO2: 26 mEq/L (ref 19–32)
Calcium: 8.6 mg/dL (ref 8.4–10.5)
Chloride: 106 mEq/L (ref 96–112)
Creatinine, Ser: 0.6 mg/dL (ref 0.4–1.5)
GFR: 139.02 mL/min (ref >60.00)

## 2012-08-10 NOTE — Assessment & Plan Note (Signed)
Stable per measurements here and a few home measurements. Continue current med.  Encouraged at least a weekly bp check and encouraged him to write this # down and bring in at f/u visits for my review. Lytes/Cr today.

## 2012-08-10 NOTE — Assessment & Plan Note (Signed)
He is not on med and really doesn't want to be on one. He continues to push for more time with the TLC option even though I tell him the longer he puts this off and doesn't start actually doing any TLC, the more damage he may potentially be doing to himself.   Recheck FLP today.
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
## 2012-08-10 NOTE — Assessment & Plan Note (Signed)
Control sounds ok but he gives very little home data. Check HbA1c today, as well as urine microalb/cr. Foot exam normal today. Once again, encouraged REGULAR exercise routine, which he states today he is really getting motivated to do as soon as the cold weather lifts. He declined flu vaccine.  He said he would consider by recommendation of getting pneumovax and also consider starting an 81mg  ASA daily.

## 2012-08-10 NOTE — Assessment & Plan Note (Signed)
Pt desires referral to [facility], says past GI MD office was going to require several hundred dollars from pt due to insurance limitations of some sort.  Referral order made today b/c he is overdue for TCS (was due in 2012).

## 2012-08-10 NOTE — Assessment & Plan Note (Signed)
Improved with new hearing aids.  

## 2012-08-11 ENCOUNTER — Encounter: Payer: Self-pay | Admitting: Internal Medicine

## 2012-08-11 ENCOUNTER — Other Ambulatory Visit: Payer: Self-pay | Admitting: Family Medicine

## 2012-08-11 MED ORDER — METFORMIN HCL 500 MG PO TABS: 500.0000 mg | ORAL_TABLET | Freq: Two times a day (BID) | ORAL | Status: DC

## 2012-08-11 NOTE — Progress Notes (Signed)
Metformin sent to [facility] per patient request according to result noted dated today.

## 2012-09-11 ENCOUNTER — Telehealth: Payer: Self-pay | Admitting: *Deleted

## 2012-09-11 ENCOUNTER — Ambulatory Visit (AMBULATORY_SURGERY_CENTER): Payer: Medicare Other | Admitting: *Deleted

## 2012-09-11 VITALS — Ht 70.0 in | Wt 186.0 lb

## 2012-09-11 DIAGNOSIS — Z1211 Encounter for screening for malignant neoplasm of colon: Secondary | ICD-10-CM

## 2012-09-11 DIAGNOSIS — Z86010 Personal history of colonic polyps: Secondary | ICD-10-CM

## 2012-09-11 MED ORDER — NA SULFATE-K SULFATE-MG SULF 17.5-3.13-1.6 GM/177ML PO SOLN: 1.0000 | Freq: Once | ORAL | Status: DC

## 2012-09-11 NOTE — Telephone Encounter (Signed)
Faxed ROI to [facility] and put ASAP on request form.

## 2012-09-11 NOTE — Progress Notes (Signed)
No egg or soy allergy. ewm  No problems with sedation/intubation in the past. ewm

## 2012-09-11 NOTE — Telephone Encounter (Signed)
Pt scheduled for a colonoscopy for 10-03-12 at 900 am with dr Leone Payor.  We need to get records from [facility] per pt. Colonoscopy done  there 5 years ago or so he said.  He also said he "thinks" was [place], said was on [address]. Records release to [place] CMA for dr gessner today 09-11-12. ewm

## 2012-09-13 ENCOUNTER — Encounter: Payer: Self-pay | Admitting: Internal Medicine

## 2012-09-13 NOTE — Progress Notes (Signed)
Patient ID: Matthew Ramirez, male   DOB: 20-Jun-1941, 72 y.o.   MRN: [id number] Records from [facility] received and put in Dr. Marvell Fuller office for review prior to his direct colonoscopy 10/03/2012.

## 2012-09-14 ENCOUNTER — Encounter: Payer: Self-pay | Admitting: Internal Medicine

## 2012-10-03 ENCOUNTER — Encounter: Payer: Self-pay | Admitting: Internal Medicine

## 2012-10-03 ENCOUNTER — Ambulatory Visit (AMBULATORY_SURGERY_CENTER): Payer: Medicare Other | Admitting: Internal Medicine

## 2012-10-03 VITALS — BP 119/79 | HR 52 | Temp 98.0°F | Resp 32 | Ht 70.0 in | Wt 186.0 lb

## 2012-10-03 DIAGNOSIS — K5730 Diverticulosis of large intestine without perforation or abscess without bleeding: Secondary | ICD-10-CM

## 2012-10-03 DIAGNOSIS — D126 Benign neoplasm of colon, unspecified: Secondary | ICD-10-CM

## 2012-10-03 DIAGNOSIS — Z1211 Encounter for screening for malignant neoplasm of colon: Secondary | ICD-10-CM

## 2012-10-03 DIAGNOSIS — Z86010 Personal history of colonic polyps: Secondary | ICD-10-CM

## 2012-10-03 DIAGNOSIS — K648 Other hemorrhoids: Secondary | ICD-10-CM

## 2012-10-03 MED ORDER — SODIUM CHLORIDE 0.9 % IV SOLN: 500.0000 mL | INTRAVENOUS | Status: DC

## 2012-10-03 NOTE — Patient Instructions (Addendum)
Two polyps were seen and removed today - they look benign. You also have internal hemorrhoids and diverticulosis - not usually a problem for people.  If the hemorrhoids cause problems let me know - there are treatments available.  I will let you know pathology results and when to have another routine colonoscopy by mail.  Thank you for choosing me and [facility].  Iva Boop, MD, FACG    YOU HAD AN ENDOSCOPIC PROCEDURE TODAY AT THE [facility]: Refer to the procedure report that was given to you for any specific questions about what was found during the examination.  If the procedure report does not answer your questions, please call your gastroenterologist to clarify.  If you requested that your care partner not be given the details of your procedure findings, then the procedure report has been included in a sealed envelope for you to review at your convenience later.  YOU SHOULD EXPECT: Some feelings of bloating in the abdomen. Passage of more gas than usual.  Walking can help get rid of the air that was put into your GI tract during the procedure and reduce the bloating. If you had a lower endoscopy (such as a colonoscopy or flexible sigmoidoscopy) you may notice spotting of blood in your stool or on the toilet paper. If you underwent a bowel prep for your procedure, then you may not have a normal bowel movement for a few days.  DIET: Your first meal following the procedure should be a light meal and then it is ok to progress to your normal diet.  A half-sandwich or bowl of soup is an example of a good first meal.  Heavy or fried foods are harder to digest and may make you feel nauseous or bloated.  Likewise meals heavy in dairy and vegetables can cause extra gas to form and this can also increase the bloating.  Drink plenty of fluids but you should avoid alcoholic beverages for 24 hours.  ACTIVITY: Your care partner should take you home directly after the  procedure.  You should plan to take it easy, moving slowly for the rest of the day.  You can resume normal activity the day after the procedure however you should NOT DRIVE or use heavy machinery for 24 hours (because of the sedation medicines used during the test).    SYMPTOMS TO REPORT IMMEDIATELY: A gastroenterologist can be reached at any hour.  During normal business hours, 8:30 AM to 5:00 PM Monday through Friday, call [phone].  After hours and on weekends, please call the GI answering service at [phone] who will take a message and have the physician on call contact you.   Following lower endoscopy (colonoscopy or flexible sigmoidoscopy):  Excessive amounts of blood in the stool  Significant tenderness or worsening of abdominal pains  Swelling of the abdomen that is new, acute  Fever of 100F or higher    FOLLOW UP: If any biopsies were taken you will be contacted by phone or by letter within the next 1-3 weeks.  Call your gastroenterologist if you have not heard about the biopsies in 3 weeks.  Our staff will call the home number listed on your records the next business day following your procedure to check on you and address any questions or concerns that you may have at that time regarding the information given to you following your procedure. This is a courtesy call and so if there is no answer at the home number and  we have not heard from you through the emergency physician on call, we will assume that you have returned to your regular daily activities without incident.  SIGNATURES/CONFIDENTIALITY: You and/or your care partner have signed paperwork which will be entered into your electronic medical record.  These signatures attest to the fact that that the information above on your After Visit Summary has been reviewed and is understood.  Full responsibility of the confidentiality of this discharge information lies with you and/or your care-partner.

## 2012-10-03 NOTE — Progress Notes (Signed)
Lidocaine-40mg IV prior to Propofol InductionPropofol given over incremental dosages 

## 2012-10-03 NOTE — Op Note (Signed)
[facility] [address] [place], [address]   COLONOSCOPY PROCEDURE REPORT  PATIENT: Matthew Ramirez, Matthew Ramirez  MR#: [id number] BIRTHDATE: September 30, 1940 , 71  yrs. old GENDER: Male ENDOSCOPIST: Iva Boop, MD, [facility] REFERRED XB:[id number] Milinda Cave, M.D. PROCEDURE DATE:  10/03/2012 PROCEDURE:   Colonoscopy with snare polypectomy ASA CLASS:   Class II INDICATIONS:Screening and surveillance,personal history of colonic polyps. MEDICATIONS: propofol (Diprivan) 200mg  IV, MAC sedation, administered by CRNA, and These medications were titrated to patient response per physician's verbal order  DESCRIPTION OF PROCEDURE:   After the risks benefits and alternatives of the procedure were thoroughly explained, informed consent was obtained.  A digital rectal exam revealed no abnormalities of the rectum.   The LB [id number]  endoscope was introduced through the anus and advanced to the cecum, which was identified by both the appendix and ileocecal valve. No adverse events experienced.   The quality of the prep was Suprep excellent The instrument was then slowly withdrawn as the colon was fully examined.      COLON FINDINGS: Two smooth sessile polyps measuring 3 and 9 mm in size were found at the cecum and in the ascending colon.  A polypectomy was performed with a cold snare.  The resection was complete and the polyp tissue was completely retrieved.   Moderate diverticulosis was noted in the sigmoid colon.   Small internal hemorrhoids were found.   The colon mucosa was otherwise normal. A right colon retroflexion was performed.  Retroflexed views revealed internal hemorrhoids. The time to cecum=3 minutes 08 seconds.  Withdrawal time=14 minutes 20 seconds.  The scope was withdrawn and the procedure completed. COMPLICATIONS: There were no complications.  ENDOSCOPIC IMPRESSION: 1.   Two sessile polyps measuring 3 and 9 mm in size were found at the cecum and in the  ascending colon; polypectomy was performed with a cold snare 2.   Moderate diverticulosis was noted in the sigmoid colon 3.   Small internal hemorrhoids 4.   The colon mucosa was otherwise normal - excellent prep  RECOMMENDATIONS: Timing of repeat colonoscopy will be determined by pathology findings in this patient w/ 3 adenomas removed 2010   eSigned:  Iva Boop, MD, [facility] 10/03/2012 9:49 AM   cc: Earley Favor, MD and The Patient

## 2012-10-03 NOTE — Progress Notes (Signed)
Patient did not experience any of the following events: a burn prior to discharge; a fall within the facility; wrong site/side/patient/procedure/implant event; or a hospital transfer or hospital admission upon discharge from the facility. (G8907) Patient did not have preoperative order for IV antibiotic SSI prophylaxis. (G8918)  

## 2012-10-03 NOTE — Progress Notes (Signed)
No complaints noted in the recovery room. Maw   

## 2012-10-04 ENCOUNTER — Telehealth: Payer: Self-pay

## 2012-10-04 NOTE — Telephone Encounter (Signed)
  Follow up Call-  Call back number 10/03/2012  Post procedure Call Back phone  # [phone]  Permission to leave phone message Yes     Patient questions:  Do you have a fever, pain , or abdominal swelling? no Pain Score  0 *  Have you tolerated food without any problems? yes  Have you been able to return to your normal activities? yes  Do you have any questions about your discharge instructions: Diet   no Medications  no Follow up visit  no  Do you have questions or concerns about your Care? no  Actions: * If pain score is 4 or above: No action needed, pain <4.

## 2012-10-10 NOTE — Telephone Encounter (Signed)
Old records scanned into epic. ewm

## 2012-10-11 ENCOUNTER — Encounter: Payer: Self-pay | Admitting: Internal Medicine

## 2012-10-11 NOTE — Progress Notes (Signed)
Quick Note:  2 adenomas max 9 mm - repeat colonoscopy 3/19 approximately ______

## 2012-10-15 ENCOUNTER — Encounter: Payer: Self-pay | Admitting: Family Medicine

## 2012-11-29 ENCOUNTER — Encounter: Payer: Self-pay | Admitting: Internal Medicine

## 2012-11-29 ENCOUNTER — Ambulatory Visit: Payer: Medicare Other | Admitting: Internal Medicine

## 2012-11-29 VITALS — BP 120/80 | HR 66 | Ht 70.0 in | Wt 187.0 lb

## 2012-11-29 DIAGNOSIS — K648 Other hemorrhoids: Secondary | ICD-10-CM

## 2012-11-29 NOTE — Patient Instructions (Addendum)

## 2012-11-29 NOTE — Progress Notes (Signed)
PROCEDURE NOTE: The patient presents with symptomatic grade 2 hemorrhoids, unresponsive to maximal medical therapy, requesting rubber band ligation of his/her hemorrhoidal disease.  All risks, benefits and alternative forms of therapy were described and informed consent was obtained.  The decision was made to band the RP internal hemorrhoid, and the [facility] O'Regan System was used to perform band ligation without complication.  Digital anorectal examination was then performed to assure proper positioning of the band, and to adjust the banded tissue as required.  The patient was discharged home without pain or other issues.  Dietary and behavioral recommendations were given and along with follow-up instructions.  The patient will return 2 weeks for follow-up and possible additional banding as required. No complications were encountered and the patient tolerated the procedure well.

## 2012-12-13 ENCOUNTER — Other Ambulatory Visit: Payer: Self-pay | Admitting: *Deleted

## 2012-12-13 MED ORDER — LISINOPRIL 5 MG PO TABS: 5.0000 mg | ORAL_TABLET | Freq: Every day | ORAL | Status: DC

## 2012-12-13 NOTE — Telephone Encounter (Signed)
Rx request to pharmacy; *PATIENT DUE FOR FOLLOW-UP OFFICE VISIT*/SLS    

## 2012-12-20 ENCOUNTER — Encounter: Payer: Medicare Other | Admitting: Internal Medicine

## 2012-12-21 ENCOUNTER — Ambulatory Visit (INDEPENDENT_AMBULATORY_CARE_PROVIDER_SITE_OTHER): Payer: Medicare Other | Admitting: Internal Medicine

## 2012-12-21 VITALS — BP 124/72 | HR 78 | Ht 70.0 in | Wt 187.0 lb

## 2012-12-21 DIAGNOSIS — K648 Other hemorrhoids: Secondary | ICD-10-CM

## 2012-12-21 DIAGNOSIS — K649 Unspecified hemorrhoids: Secondary | ICD-10-CM

## 2012-12-21 MED ORDER — NYSTATIN-TRIAMCINOLONE 100000-0.1 UNIT/GM-% EX OINT: TOPICAL_OINTMENT | Freq: Two times a day (BID) | CUTANEOUS | Status: DC

## 2012-12-21 NOTE — Patient Instructions (Addendum)
We have sent the following medications to your pharmacy for you to pick up at your convenience: Nystatin  Please purchase Balenol over the counter to use on your rectal area.  Don't over soap the rectal area when cleaning.  HEMORRHOID BANDING PROCEDURE    FOLLOW-UP CARE   1. The procedure you have had should have been relatively painless since the banding of the area involved does not have nerve endings and there is no pain sensation.  The rubber band cuts off the blood supply to the hemorrhoid and the band may fall off as soon as 48 hours after the banding (the band may occasionally be seen in the toilet bowl following a bowel movement). You may notice a temporary feeling of fullness in the rectum which should respond adequately to plain Tylenol or Motrin.  2. Following the banding, avoid strenuous exercise that evening and resume full activity the next day.  A sitz bath (soaking in a warm tub) or bidet is soothing, and can be useful for cleansing the area after bowel movements.     3. To avoid constipation, take two tablespoons of natural wheat bran, natural oat bran, flax, Benefiber or any over the counter fiber supplement and increase your water intake to 7-8 glasses daily.    4. Unless you have been prescribed anorectal medication, do not put anything inside your rectum for two weeks: No suppositories, enemas, fingers, etc.  5. Occasionally, you may have more bleeding than usual after the banding procedure.  This is often from the untreated hemorrhoids rather than the treated one.  Don't be concerned if there is a tablespoon or so of blood.  If there is more blood than this, lie flat with your bottom higher than your head and apply an ice pack to the area. If the bleeding does not stop within a half an hour or if you feel faint, call our office at [phone] or go to the emergency room.  6. Problems are not common; however, if there is a substantial amount of bleeding, severe pain,  chills, fever or difficulty passing urine (very rare) or other problems, you should call us at [phone] or report to the nearest emergency room.  7. Do not stay seated continuously for more than 2-3 hours for a day or two after the procedure.  Tighten your buttock muscles 10-15 times every two hours and take 10-15 deep breaths every 1-2 hours.  Do not spend more than a few minutes on the toilet if you cannot empty your bowel; instead re-visit the toilet at a later time.    I appreciate the opportunity to care for you.

## 2012-12-21 NOTE — Progress Notes (Signed)
Patient ID: Matthew Ramirez, male   DOB: 1940/09/20, 72 y.o.   MRN: [id number]  PROCEDURE NOTE: The patient presents with symptomatic grade 2  hemorrhoids, unresponsive to maximal medical therapy, requesting rubber band ligation of his/her hemorrhoidal disease.  All risks, benefits and alternative forms of therapy were described and informed consent was obtained. He is s/p prior ligation in the RP position.  In the Left Lateral Decubitus position the decision was made to band the RA internal hemorrhoid, and the [facility] O'Regan System was used to perform band ligation without complication.  Digital anorectal examination was then performed to assure proper positioning of the band, and to adjust the banded tissue as required.  The patient was discharged home without pain or other issues.  Dietary and behavioral recommendations were given and (if necessary - prescriptions were given), along with follow-up instructions.  The patient will return in 3-4 weeks for follow-up and possible additional banding as required. Anticipate next ligation to be LL column. No complications were encountered and the patient tolerated the procedure well.

## 2013-01-16 ENCOUNTER — Encounter: Payer: Self-pay | Admitting: Internal Medicine

## 2013-01-16 ENCOUNTER — Ambulatory Visit (INDEPENDENT_AMBULATORY_CARE_PROVIDER_SITE_OTHER): Payer: Medicare Other | Admitting: Internal Medicine

## 2013-01-16 VITALS — BP 126/72 | HR 80 | Ht 70.0 in | Wt 189.4 lb

## 2013-01-16 DIAGNOSIS — K648 Other hemorrhoids: Secondary | ICD-10-CM

## 2013-01-16 NOTE — Progress Notes (Signed)
Patient ID: OSIE AMPARO, male   DOB: 1940-11-02, 72 y.o.   MRN: [id number]  PROCEDURE NOTE: The patient presents with symptomatic grade (1, 2, 3, 4) hemorrhoids, unresponsive to maximal medical therapy, requesting rubber band ligation of his/her hemorrhoidal disease.  All risks, benefits and alternative forms of therapy were described and informed consent was obtained.   Still has perianal dermatitis, not causing many symptoms. Reports some rectal bleeding last 1-2 days after a period of time without.   In the Left Lateral Decubitus position anoscopic examination revealed grade 1 hemorrhoids in the  LL position.  The decision was made to band the  LL internal hemorrhoid, and the [facility] O'[place]an System was used to perform band ligation without complication.  Digital anorectal examination was then performed to assure proper positioning of the band, and to adjust the banded tissue as required.  The patient will return in 2 months for follow-up and possible additional banding as required. No complications were encountered and the patient tolerated the procedure well.

## 2013-01-16 NOTE — Patient Instructions (Addendum)
Follow up with [place] in 2 months please.   HEMORRHOID BANDING PROCEDURE    FOLLOW-UP CARE   1. The procedure you have had should have been relatively painless since the banding of the area involved does not have nerve endings and there is no pain sensation.  The rubber band cuts off the blood supply to the hemorrhoid and the band may fall off as soon as 48 hours after the banding (the band may occasionally be seen in the toilet bowl following a bowel movement). You may notice a temporary feeling of fullness in the rectum which should respond adequately to plain Tylenol or Motrin.  2. Following the banding, avoid strenuous exercise that evening and resume full activity the next day.  A sitz bath (soaking in a warm tub) or bidet is soothing, and can be useful for cleansing the area after bowel movements.     3. To avoid constipation, take two tablespoons of natural wheat bran, natural oat bran, flax, Benefiber or any over the counter fiber supplement and increase your water intake to 7-8 glasses daily.    4. Unless you have been prescribed anorectal medication, do not put anything inside your rectum for two weeks: No suppositories, enemas, fingers, etc.  5. Occasionally, you may have more bleeding than usual after the banding procedure.  This is often from the untreated hemorrhoids rather than the treated one.  Don't be concerned if there is a tablespoon or so of blood.  If there is more blood than this, lie flat with your bottom higher than your head and apply an ice pack to the area. If the bleeding does not stop within a half an hour or if you feel faint, call our office at [phone] or go to the emergency room.  6. Problems are not common; however, if there is a substantial amount of bleeding, severe pain, chills, fever or difficulty passing urine (very rare) or other problems, you should call us at [phone] or report to the nearest emergency room.  7. Do not stay seated  continuously for more than 2-3 hours for a day or two after the procedure.  Tighten your buttock muscles 10-15 times every two hours and take 10-15 deep breaths every 1-2 hours.  Do not spend more than a few minutes on the toilet if you cannot empty your bowel; instead re-visit the toilet at a later time.    I appreciate the opportunity to care for you.

## 2013-01-18 ENCOUNTER — Other Ambulatory Visit: Payer: Self-pay

## 2013-03-19 ENCOUNTER — Encounter: Payer: Self-pay | Admitting: Family Medicine

## 2013-03-19 ENCOUNTER — Ambulatory Visit (INDEPENDENT_AMBULATORY_CARE_PROVIDER_SITE_OTHER): Payer: Medicare Other | Admitting: Family Medicine

## 2013-03-19 ENCOUNTER — Other Ambulatory Visit: Payer: Self-pay | Admitting: Family Medicine

## 2013-03-19 VITALS — BP 131/81 | HR 77 | Temp 98.9°F | Resp 16 | Ht 68.5 in | Wt 192.0 lb

## 2013-03-19 DIAGNOSIS — E119 Type 2 diabetes mellitus without complications: Secondary | ICD-10-CM

## 2013-03-19 DIAGNOSIS — I10 Essential (primary) hypertension: Secondary | ICD-10-CM

## 2013-03-19 LAB — HEMOGLOBIN A1C: Mean Plasma Glucose: 146 mg/dL — ABNORMAL HIGH (ref <117)

## 2013-03-19 MED ORDER — LISINOPRIL 5 MG PO TABS: 5.0000 mg | ORAL_TABLET | Freq: Every day | ORAL | Status: DC

## 2013-03-19 NOTE — Progress Notes (Signed)
OFFICE NOTE  03/19/2013  CC:  Chief Complaint  Patient presents with  . Hypertension    Patient needs lisinopril refilled  . Diabetes    patient not taking metformin     HPI: Patient is a 72 y.o. Caucasian male who is here for 8 mo f/u DM 2, HTN, hyperlipidemia. Compliant with bp med, bp checks at home normal. Glucoses: 130-150 fasting, 2H PPs similar.  Never started metformin.  Has been walking several days a week and hopes that this has been helping.  Diet is a diabetic diet for the most part. No acute complaints.  No significant pain, tingling, or numbness in feet.   Pertinent PMH:  Past Medical History  Diagnosis Date  . HTN (hypertension), benign   . Hearing impairment     Occupational noise damage x years.  Has hearing aids AU.  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  Marland Kitchen External hemorrhoids   . History of colon polyps 2009  . DM (diabetes mellitus) 05/2011  . Hyperlipidemia   . 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 Asthma     as child-no mdi use now  . Personal history of colonic adenomas 09/14/2012    Polypectomy 2010 and 2014.   Past Surgical History  Procedure Laterality Date  . Eye surgery      Right (s/p traumatic injury to eye)  . Circumcision  age 20  . Dental surgery      10+ root canals, several bridges  . Colonoscopy w/ polypectomy  2009; 2014    09/2012: tubular adenoma--no high grade dysplasia.  Repeat 09/2017 approx.  Diverticulosis and small int hem   Past family and social history reviewed and there are no changes since the patient's last office visit with me.  MEDS: **Not taking metformin as listed below. Outpatient Prescriptions Prior to Visit  Medication Sig Dispense Refill  . B Complex-C (SUPER B COMPLEX PO) Take 2 tablets by mouth daily.      . Cholecalciferol (VITAMIN D) 400 UNITS capsule Take 400 Units by mouth daily.      Marland Kitchen CINNAMON PO Take 1 tablet by mouth daily.      Marland Kitchen GARLIC PO  Take 1 tablet by mouth daily.      Marland Kitchen ibuprofen (ADVIL,MOTRIN) 200 MG tablet Take 400 mg by mouth every 3 (three) hours as needed.      Marland Kitchen lisinopril (PRINIVIL,ZESTRIL) 5 MG tablet Take 1 tablet (5 mg total) by mouth daily.  90 tablet  0  . magnesium 30 MG tablet Take 30 mg by mouth daily.      Letta Pate DELICA LANCETS MISC Check glucose twice daily  60 each  5  . OVER THE COUNTER MEDICATION B5 supplement, one a day      . OVER THE COUNTER MEDICATION Choline, one a day      . Red Yeast Rice 600 MG CAPS Take 2 capsules by mouth daily.      Marland Kitchen senna (SENOKOT) 8.6 MG TABS Take 2 tablets by mouth.        . metFORMIN (GLUCOPHAGE) 500 MG tablet Take 1 tablet (500 mg total) by mouth 2 (two) times daily with a meal.  60 tablet  3  . nystatin-triamcinolone ointment (MYCOLOG) Apply topically 2 (two) times daily.  30 g  2   No facility-administered medications prior to visit.    PE: Blood pressure 131/81, pulse 77, temperature 98.9 F (37.2 C), temperature source Temporal, resp.  rate 16, height 5' 8.5" (1.74 m), weight 192 lb (87.091 kg), SpO2 93.00%. Gen: Alert, well appearing.  Patient is oriented to person, place, time, and situation. CV: RRR, no m/r/g.   LUNGS: CTA bilat, nonlabored resps, good aeration in all lung fields. EXT: no clubbing, cyanosis, or edema.  Feet without erythema or skin breakdown.   IMPRESSION AND PLAN:  Type II or unspecified type diabetes mellitus without mention of complication, not stated as uncontrolled Stable on no meds---never took metformin. Check HbA1c and continue improvements in diet and exercise.  HTN (hypertension), benign Problem stable.  Continue current medications and diet appropriate for this condition.  We have reviewed our general long term plan for this problem and also reviewed symptoms and signs that should prompt the patient to call or return to the office. Med refill done today. Check BMET today.   Patient declined flu vaccine and pneumovax  today.  He also declines to take a daily 81mg  aspirin b/c he says it leads to too much easy bruisability.  An After Visit Summary was printed and given to the patient.  FOLLOW UP: 6 mo for fasting CPE

## 2013-03-19 NOTE — Assessment & Plan Note (Signed)
Problem stable.  Continue current medications and diet appropriate for this condition.  We have reviewed our general long term plan for this problem and also reviewed symptoms and signs that should prompt the patient to call or return to the office. Med refill done today. Check BMET today.

## 2013-03-19 NOTE — Telephone Encounter (Signed)
Patient is now on the schedule for today.

## 2013-03-19 NOTE — Telephone Encounter (Signed)
Patient has not been seen since 08/11/12.  Patient has already been given a refill stating that would be the last medication refill until he is seen in our office.

## 2013-03-19 NOTE — Assessment & Plan Note (Signed)
Stable on no meds---never took metformin. Check HbA1c and continue improvements in diet and exercise.

## 2013-03-20 LAB — BASIC METABOLIC PANEL
CO2: 25 mEq/L (ref 19–32)
Chloride: 105 mEq/L (ref 96–112)
Creat: 0.66 mg/dL (ref 0.50–1.35)

## 2013-07-25 ENCOUNTER — Other Ambulatory Visit: Payer: Self-pay | Admitting: Family Medicine

## 2013-07-25 MED ORDER — ONETOUCH ULTRA SYSTEM W/DEVICE KIT: 1.0000 | PACK | Freq: Once | Status: AC

## 2013-09-17 ENCOUNTER — Encounter: Payer: Self-pay | Admitting: Family Medicine

## 2013-09-17 ENCOUNTER — Ambulatory Visit (INDEPENDENT_AMBULATORY_CARE_PROVIDER_SITE_OTHER): Payer: Medicare HMO | Admitting: Family Medicine

## 2013-09-17 VITALS — BP 147/82 | HR 60 | Temp 98.2°F | Resp 18 | Ht 68.5 in | Wt 195.0 lb

## 2013-09-17 DIAGNOSIS — G4710 Hypersomnia, unspecified: Secondary | ICD-10-CM

## 2013-09-17 DIAGNOSIS — Z860100 Personal history of colon polyps, unspecified: Secondary | ICD-10-CM

## 2013-09-17 DIAGNOSIS — Z0389 Encounter for observation for other suspected diseases and conditions ruled out: Secondary | ICD-10-CM

## 2013-09-17 DIAGNOSIS — I10 Essential (primary) hypertension: Secondary | ICD-10-CM

## 2013-09-17 DIAGNOSIS — E1165 Type 2 diabetes mellitus with hyperglycemia: Secondary | ICD-10-CM

## 2013-09-17 DIAGNOSIS — E785 Hyperlipidemia, unspecified: Secondary | ICD-10-CM

## 2013-09-17 DIAGNOSIS — E119 Type 2 diabetes mellitus without complications: Secondary | ICD-10-CM

## 2013-09-17 DIAGNOSIS — IMO0001 Reserved for inherently not codable concepts without codable children: Secondary | ICD-10-CM

## 2013-09-17 LAB — CBC WITH DIFFERENTIAL/PLATELET
BASOS PCT: 0.4 % (ref 0.0–3.0)
Basophils Absolute: 0 10*3/uL (ref 0.0–0.1)
EOS PCT: 3.3 % (ref 0.0–5.0)
Eosinophils Absolute: 0.3 10*3/uL (ref 0.0–0.7)
HEMATOCRIT: 46 % (ref 39.0–52.0)
Hemoglobin: 15.4 g/dL (ref 13.0–17.0)
Lymphocytes Relative: 27.2 % (ref 12.0–46.0)
Lymphs Abs: 2.1 10*3/uL (ref 0.7–4.0)
MCHC: 33.5 g/dL (ref 30.0–36.0)
MCV: 91.2 fl (ref 78.0–100.0)
MONOS PCT: 8.1 % (ref 3.0–12.0)
Monocytes Absolute: 0.6 10*3/uL (ref 0.1–1.0)
NEUTROS PCT: 61 % (ref 43.0–77.0)
Neutro Abs: 4.7 10*3/uL (ref 1.4–7.7)
PLATELETS: 185 10*3/uL (ref 150.0–400.0)
RBC: 5.05 Mil/uL (ref 4.22–5.81)
RDW: 13.4 % (ref 11.5–14.6)
WBC: 7.7 10*3/uL (ref 4.5–10.5)

## 2013-09-17 LAB — HEMOGLOBIN A1C: HEMOGLOBIN A1C: 7.5 % — AB (ref 4.6–6.5)

## 2013-09-17 NOTE — Progress Notes (Signed)
Office Note 09/17/2013  CC:  Chief Complaint  Patient presents with  . Annual Exam  . Fatigue    HPI:  Matthew Ramirez is a 73 y.o. White male who is here for annual CPE, f/u DM 2, HTN, hyperlpidemia. He is fasting.   Not monitoring glucoses or bp at home. Denies polyuria, polydipsia, or polyphagia.    Pt c/o being drowsy a lot--this has been present for years.  Sleep test in past showed mild OSA--no CPAP recommended.  Wife does not note significant snoring or apneic periods. Falls asleep at red lights occasionally.  He says he sleeps 8 hours a night and wakes up feeling rested, but when he sits still he can be asleep in 5 min.  He urinates once per night and goes right back to sleep. Drinks 3-4 cups coffee in morning, lately 1 at night.  Drinks no other caffeine drinks. He denies fatigue from activity, SOB, CP, or HA's. Drinks avg of a beer every other day, occ mixed drink. No change in memory, no tremor, no ataxia.  No depression.   Past Medical History  Diagnosis Date  . HTN (hypertension), benign   . Hearing impairment     Occupational noise damage x years.  Has hearing aids AU.  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  Marland Kitchen External hemorrhoids   . History of colon polyps 2009  . DM (diabetes mellitus) 05/2011  . Hyperlipidemia   . 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 Asthma     as child-no mdi use now  . Personal history of colonic adenomas 2009; 09/14/2012    Polypectomy 2010 and 2014.    Past Surgical History  Procedure Laterality Date  . Eye surgery      Right (s/p traumatic injury to eye)  . Circumcision  age 79  . Dental surgery      10+ root canals, several bridges  . Colonoscopy w/ polypectomy  2009; 2014    09/2012: tubular adenoma--no high grade dysplasia.  Repeat 09/2017 approx.  Diverticulosis and small int hem    Family History  Problem Relation Age of Onset  . Cancer Mother     leukemia  .  Cancer Sister     ovarian  . Colon cancer Neg Hx   . Rectal cancer Neg Hx   . Stomach cancer Neg Hx   . Emphysema Father     smoker    History   Social History  . Marital Status: Married    Spouse Name: N/A    Number of Children: N/A  . Years of Education: N/A   Occupational History  . Not on file.   Social History Main Topics  . Smoking status: Former Smoker -- 1.00 packs/day for 20 years    Types: Cigarettes    Quit date: 07/12/1968  . Smokeless tobacco: Never Used  . Alcohol Use: Yes     Comment: a couple of drinks a week  . Drug Use: No  . Sexual Activity: Not on file   Other Topics Concern  . Not on file   Social History Narrative   Married, has 4 grown daughters, 15 grandchildren, and 1 great grandchild.   Occupation: Chief Financial Officer.  Originally from [facility] [place] [facility], has lived in [place] since age 70 yrs.   Distant tobacco abuse: quit 1970.  Rare ETOH intake, no hx of ETOH abuse.  No drug use.   Walks about 4 miles per day.  Outpatient Prescriptions Prior to Visit  Medication Sig Dispense Refill  . B Complex-C (SUPER B COMPLEX PO) Take 2 tablets by mouth daily.      . Cholecalciferol (VITAMIN D) 400 UNITS capsule Take 400 Units by mouth daily.      Marland Kitchen GARLIC PO Take 1 tablet by mouth daily.      Marland Kitchen ibuprofen (ADVIL,MOTRIN) 200 MG tablet Take 400 mg by mouth every 3 (three) hours as needed.      Marland Kitchen lisinopril (PRINIVIL,ZESTRIL) 5 MG tablet Take 1 tablet (5 mg total) by mouth daily.  90 tablet  2  . magnesium 30 MG tablet Take 30 mg by mouth daily.      Marland Kitchen OVER THE COUNTER MEDICATION B5 supplement, one a day      . senna (SENOKOT) 8.6 MG TABS Take 2 tablets by mouth.        . Blood Glucose Monitoring Suppl (ONE TOUCH ULTRA SYSTEM KIT) W/DEVICE KIT 1 kit by Does not apply route once.  100 each  3  . CINNAMON PO Take 1 tablet by mouth daily.      Marland Kitchen nystatin-triamcinolone ointment (MYCOLOG) Apply topically 2 (two) times daily.  30 g  2  . ONETOUCH  DELICA LANCETS MISC Check glucose twice daily  60 each  5  . OVER THE COUNTER MEDICATION Choline, one a day      . Red Yeast Rice 600 MG CAPS Take 2 capsules by mouth daily.       No facility-administered medications prior to visit.    Allergies  Allergen Reactions  . Aspirin Other (See Comments)    Easy bruising    ROS Review of Systems  Constitutional: Negative for fever, chills, appetite change and fatigue.  HENT: Negative for congestion, dental problem, ear pain and sore throat.   Eyes: Negative for discharge, redness and visual disturbance.  Respiratory: Negative for cough, chest tightness, shortness of breath and wheezing.   Cardiovascular: Negative for chest pain, palpitations and leg swelling.  Gastrointestinal: Negative for nausea, vomiting, abdominal pain, diarrhea and blood in stool.  Genitourinary: Negative for dysuria, urgency, frequency, hematuria, flank pain and difficulty urinating.  Musculoskeletal: Negative for arthralgias, back pain, joint swelling, myalgias and neck stiffness.  Skin: Negative for pallor and rash.  Neurological: Negative for dizziness, speech difficulty, weakness and headaches.  Hematological: Negative for adenopathy. Does not bruise/bleed easily.  Psychiatric/Behavioral: Negative for confusion and sleep disturbance. The patient is not nervous/anxious.        Excessive daytime somnolence as noted in HPI     PE; Blood pressure 147/82, pulse 60, temperature 98.2 F (36.8 C), temperature source Temporal, resp. rate 18, height 5' 8.5" (1.74 m), weight 195 lb (88.451 kg), SpO2 94.00%. Gen: Alert, well appearing.  Patient is oriented to person, place, time, and situation. AFFECT: pleasant, lucid thought and speech. ENT: Ears: EACs clear, normal epithelium.  TMs with good light reflex and landmarks bilaterally.  Eyes: no injection, icteris, swelling, or exudate.  EOMI, Right pupil fixed and with changes c/w pupil surgery.  Left pupil reactive. Nose:  no drainage or turbinate edema/swelling.  No injection or focal lesion.  Mouth: lips without lesion/swelling.  Oral mucosa pink and moist.  Dentition intact and without obvious caries or gingival swelling.  Oropharynx without erythema, exudate, or swelling.  Neck: supple/nontender.  No LAD, mass, or TM.  Carotid pulses 2+ bilaterally, without bruits. CV: RRR, no m/r/g.   LUNGS: CTA bilat, nonlabored resps, good aeration in all lung  fields. ABD: soft, NT, ND, BS normal.  No hepatospenomegaly or mass.  No bruits. EXT: no clubbing, cyanosis, or edema.   Diffuse non-inflamed varicosities. Musculoskeletal: no joint swelling, erythema, warmth, or tenderness.  ROM of all joints intact. Skin - no sores or suspicious lesions or rashes or color changes Foot exam -  no swelling, tenderness or skin or vascular lesions. Color and temperature is normal. Sensation is intact. Peripheral pulses are palpable. Toenails are thickened. Rectal exam: negative without mass, lesions or tenderness, PROSTATE EXAM: smooth and symmetric without nodules or tenderness. Neuro: CN 2-12 intact bilaterally, strength 5/5 in proximal and distal upper extremities and lower extremities bilaterally.  No sensory deficits.  No tremor.  No disdiadochokinesis.  No ataxia.  Upper extremity and lower extremity DTRs symmetric.  No pronator drift.   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 He does not desire any further eval by a specialist. "I'll deal with it", he says.   Next step would be referral to sleep specialist if he desires it in the future. I'm checking CBC, CMET, TSH today.  HTN (hypertension), benign The current medical regimen is effective;  continue present plan and medications. BMET today.  Type II or unspecified type diabetes mellitus without mention of complication, not stated as uncontrolled Diet controlled. Check HbA1c today. Feet exam normal  today. He declines pneumovax and flu vaccines.  Hyperlipidemia Lab Results  Component Value Date   CHOL 174 08/10/2012   HDL 38.70* 08/10/2012   LDLCALC 116* 08/10/2012   TRIG 96.0 08/10/2012   CHOLHDL 4 08/10/2012   Repeat FLP today. If LDL still >100 will recommend statin.  Personal history of colonic adenomas Repeat colonoscopy 2019.  Observation for suspected malignant neoplasm Prostate cancer screening: medicare PSA drawn today. DRE normal today.  An After Visit Summary was printed and given to the patient.  FOLLOW UP:  Return in about 6 months (around 03/20/2014) for routine chronic illness f/u.

## 2013-09-17 NOTE — Assessment & Plan Note (Signed)
The current medical regimen is effective;  continue present plan and medications. BMET today. 

## 2013-09-17 NOTE — Assessment & Plan Note (Addendum)
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 He does not desire any further eval by a specialist. "I'll deal with it", he says.   Next step would be referral to sleep specialist if he desires it in the future. I'm checking CBC, CMET, TSH today.

## 2013-09-17 NOTE — Assessment & Plan Note (Signed)
Diet controlled. Check HbA1c today. Feet exam normal today. He declines pneumovax and flu vaccines.

## 2013-09-17 NOTE — Assessment & Plan Note (Signed)
Lab Results  Component Value Date   CHOL 174 08/10/2012   HDL 38.70* 08/10/2012   LDLCALC 116* 08/10/2012   TRIG 96.0 08/10/2012   CHOLHDL 4 08/10/2012   Repeat FLP today. If LDL still >100 will recommend statin.

## 2013-09-17 NOTE — Assessment & Plan Note (Signed)
Repeat colonoscopy 2019.

## 2013-09-17 NOTE — Progress Notes (Signed)
Pre visit review using our clinic review tool, if applicable. No additional management support is needed unless otherwise documented below in the visit note. 

## 2013-09-17 NOTE — Assessment & Plan Note (Signed)
Prostate cancer screening: medicare PSA drawn today. DRE normal today.

## 2013-09-18 ENCOUNTER — Telehealth: Payer: Self-pay | Admitting: Family Medicine

## 2013-09-18 LAB — LIPID PANEL
CHOLESTEROL: 217 mg/dL — AB (ref 0–200)
HDL: 40.2 mg/dL (ref >39.00)
LDL CALC: 136 mg/dL — AB (ref 0–99)
Total CHOL/HDL Ratio: 5
Triglycerides: 205 mg/dL — ABNORMAL HIGH (ref 0.0–149.0)
VLDL: 41 mg/dL — ABNORMAL HIGH (ref 0.0–40.0)

## 2013-09-18 LAB — COMPREHENSIVE METABOLIC PANEL
ALBUMIN: 4.4 g/dL (ref 3.5–5.2)
ALT: 27 U/L (ref 0–53)
AST: 24 U/L (ref 0–37)
Alkaline Phosphatase: 88 U/L (ref 39–117)
BUN: 13 mg/dL (ref 6–23)
CALCIUM: 10.2 mg/dL (ref 8.4–10.5)
CO2: 30 meq/L (ref 19–32)
Chloride: 103 mEq/L (ref 96–112)
Creatinine, Ser: 0.8 mg/dL (ref 0.4–1.5)
GFR: 104.35 mL/min (ref >60.00)
GLUCOSE: 154 mg/dL — AB (ref 70–99)
POTASSIUM: 4.2 meq/L (ref 3.5–5.1)
SODIUM: 139 meq/L (ref 135–145)
TOTAL PROTEIN: 7.4 g/dL (ref 6.0–8.3)
Total Bilirubin: 0.8 mg/dL (ref 0.3–1.2)

## 2013-09-18 LAB — TSH: TSH: 2.01 u[IU]/mL (ref 0.35–5.50)

## 2013-09-18 NOTE — Telephone Encounter (Signed)
Relevant patient education assigned to patient using Emmi. ° °

## 2013-09-19 ENCOUNTER — Telehealth: Payer: Self-pay

## 2013-09-19 NOTE — Telephone Encounter (Signed)
Relevant patient education assigned to patient using Emmi. ° °

## 2013-10-18 ENCOUNTER — Other Ambulatory Visit: Payer: Self-pay

## 2013-12-07 ENCOUNTER — Other Ambulatory Visit: Payer: Self-pay | Admitting: Family Medicine

## 2014-01-22 ENCOUNTER — Ambulatory Visit (INDEPENDENT_AMBULATORY_CARE_PROVIDER_SITE_OTHER): Payer: Medicare HMO | Admitting: Family Medicine

## 2014-01-22 ENCOUNTER — Encounter: Payer: Self-pay | Admitting: Family Medicine

## 2014-01-22 VITALS — BP 157/89 | HR 64 | Temp 98.2°F | Resp 18 | Ht 68.5 in | Wt 193.0 lb

## 2014-01-22 DIAGNOSIS — E785 Hyperlipidemia, unspecified: Secondary | ICD-10-CM

## 2014-01-22 DIAGNOSIS — E1165 Type 2 diabetes mellitus with hyperglycemia: Principal | ICD-10-CM

## 2014-01-22 DIAGNOSIS — IMO0001 Reserved for inherently not codable concepts without codable children: Secondary | ICD-10-CM

## 2014-01-22 LAB — HEMOGLOBIN A1C: Hgb A1c MFr Bld: 7.5 % — ABNORMAL HIGH (ref 4.6–6.5)

## 2014-01-22 LAB — MICROALBUMIN / CREATININE URINE RATIO
Creatinine,U: 15.2 mg/dL
MICROALB UR: 0.1 mg/dL (ref 0.0–1.9)
Microalb Creat Ratio: 0.7 mg/g (ref 0.0–30.0)

## 2014-01-22 NOTE — Progress Notes (Signed)
Pre visit review using our clinic review tool, if applicable. No additional management support is needed unless otherwise documented below in the visit note. 

## 2014-01-22 NOTE — Progress Notes (Signed)
OFFICE NOTE  01/22/2014  CC:  Chief Complaint  Patient presents with  . Follow-up    fasting  . Tick Removal    pt removed tick under L arm a month ago, but still has a knot.   HPI: Patient is a 73 y.o. Caucasian male who is here for 4 mo f/u DM 2 and hyperlipidemia. Last visit/labs I recommended he start metformin and a statin but he wanted to try TLC first. Fasting glucoses 150 or more and 2 H PP over 200. He is walking for exercise but irregularly.  Follows "[employer] typically but admits he is not strictly adherent to this.  Feels great other than low energy and his excessive daytime somnolence is unchanged.  Removed tick from left armpit about 1 mo ago--has little bump there.  No pain or itching.   Pertinent PMH:  Past surgical, social, and family history reviewed and no changes noted since last office visit.  MEDS:  Outpatient Prescriptions Prior to Visit  Medication Sig Dispense Refill  . B Complex-C (SUPER B COMPLEX PO) Take 2 tablets by mouth daily.      . Blood Glucose Monitoring Suppl (ONE TOUCH ULTRA SYSTEM KIT) W/DEVICE KIT 1 kit by Does not apply route once.  100 each  3  . Cholecalciferol (VITAMIN D) 400 UNITS capsule Take 400 Units by mouth daily.      Marland Kitchen CINNAMON PO Take 1 tablet by mouth daily.      Marland Kitchen GARLIC PO Take 1 tablet by mouth daily.      Marland Kitchen ibuprofen (ADVIL,MOTRIN) 200 MG tablet Take 400 mg by mouth every 3 (three) hours as needed.      Marland Kitchen lisinopril (PRINIVIL,ZESTRIL) 5 MG tablet TAKE 1 TABLET BY MOUTH EVERY DAY  90 tablet  1  . magnesium 30 MG tablet Take 30 mg by mouth daily.      Glory Rosebush DELICA LANCETS MISC Check glucose twice daily  60 each  5  . OVER THE COUNTER MEDICATION B5 supplement, one a day      . senna (SENOKOT) 8.6 MG TABS Take 2 tablets by mouth.        . nystatin-triamcinolone ointment (MYCOLOG) Apply topically 2 (two) times daily.  30 g  2  . OVER THE COUNTER MEDICATION Choline, one a day      . Red Yeast Rice 600 MG CAPS Take 2  capsules by mouth daily.       No facility-administered medications prior to visit.    PE: Blood pressure 157/89, pulse 64, temperature 98.2 F (36.8 C), temperature source Temporal, resp. rate 18, height 5' 8.5" (1.74 m), weight 193 lb (87.544 kg), SpO2 95.00%. Gen: Alert, well appearing.  Patient is oriented to person, place, time, and situation. AFFECT: pleasant, lucid thought and speech. SKIN: left axilla with small papule, no erythema or tenderness or fluctuance beneath this.    LAB: none today Recent:  Lab Results  Component Value Date   HGBA1C 7.5* 09/17/2013   Lab Results  Component Value Date   CHOL 217* 09/17/2013   HDL 40.20 09/17/2013   LDLCALC 136* 09/17/2013   TRIG 205.0* 09/17/2013   CHOLHDL 5 09/17/2013     Chemistry      Component Value Date/Time   NA 139 09/17/2013 0914   K 4.2 09/17/2013 0914   CL 103 09/17/2013 0914   CO2 30 09/17/2013 0914   BUN 13 09/17/2013 0914   CREATININE 0.8 09/17/2013 0914   CREATININE 0.66 03/19/2013  1607      Component Value Date/Time   CALCIUM 10.2 09/17/2013 0914   ALKPHOS 88 09/17/2013 0914   AST 24 09/17/2013 0914   ALT 27 09/17/2013 0914   BILITOT 0.8 09/17/2013 0914       IMPRESSION AND PLAN:  1) DM 2, not ideal control per A1c and his home measurements. Recheck A1c today.  He is open to trying metformin now if Hba1c comes back high again as expected. Urine microalb/cr today.  He is reminded today that he is overdue for diab retpthy screening: he'll arrange this with Dr. Gershon Crane, his eye MD.  2) Hyperlipidemia: recheck FLP today.  He once again says he is not ready to start a statin even if lipids are still up. "I want to keep trying to fight it on my own".  3) Tick bite: site looks fine, reassured pt.  No illness.  An After Visit Summary was printed and given to the patient.  FOLLOW UP: 4 mo

## 2014-01-23 LAB — LIPID PANEL
CHOL/HDL RATIO: 4
Cholesterol: 206 mg/dL — ABNORMAL HIGH (ref 0–200)
HDL: 46.7 mg/dL (ref >39.00)
LDL CALC: 125 mg/dL — AB (ref 0–99)
NONHDL: 159.3
Triglycerides: 171 mg/dL — ABNORMAL HIGH (ref 0.0–149.0)
VLDL: 34.2 mg/dL (ref 0.0–40.0)

## 2014-01-24 ENCOUNTER — Other Ambulatory Visit: Payer: Self-pay | Admitting: Family Medicine

## 2014-01-24 MED ORDER — METFORMIN HCL 500 MG PO TABS: 500.0000 mg | ORAL_TABLET | Freq: Two times a day (BID) | ORAL | Status: DC

## 2014-02-20 ENCOUNTER — Other Ambulatory Visit: Payer: Self-pay | Admitting: Family Medicine

## 2014-02-20 MED ORDER — METFORMIN HCL 500 MG PO TABS: ORAL_TABLET | ORAL | Status: DC

## 2014-03-20 ENCOUNTER — Ambulatory Visit: Payer: Medicare HMO | Admitting: Family Medicine

## 2014-05-24 ENCOUNTER — Ambulatory Visit (INDEPENDENT_AMBULATORY_CARE_PROVIDER_SITE_OTHER): Payer: Medicare HMO | Admitting: Family Medicine

## 2014-05-24 ENCOUNTER — Encounter: Payer: Self-pay | Admitting: Family Medicine

## 2014-05-24 VITALS — BP 163/87 | HR 59 | Temp 98.0°F | Resp 18 | Ht 68.5 in | Wt 196.0 lb

## 2014-05-24 DIAGNOSIS — I10 Essential (primary) hypertension: Secondary | ICD-10-CM

## 2014-05-24 DIAGNOSIS — E119 Type 2 diabetes mellitus without complications: Secondary | ICD-10-CM

## 2014-05-24 DIAGNOSIS — E785 Hyperlipidemia, unspecified: Secondary | ICD-10-CM

## 2014-05-24 LAB — HEMOGLOBIN A1C: HEMOGLOBIN A1C: 7.8 % — AB (ref 4.6–6.5)

## 2014-05-24 MED ORDER — LISINOPRIL 10 MG PO TABS: 10.0000 mg | ORAL_TABLET | Freq: Every day | ORAL | Status: DC

## 2014-05-24 MED ORDER — METFORMIN HCL 1000 MG PO TABS: 1000.0000 mg | ORAL_TABLET | Freq: Two times a day (BID) | ORAL | Status: DC

## 2014-05-24 NOTE — Progress Notes (Signed)
OFFICE NOTE  05/24/2014  CC:  Chief Complaint  Patient presents with  . Follow-up    fasting   HPI: Patient is a 73 y.o. Caucasian male who is here for 4 mo f/u DM 2, HTN, hyperlipidemia.  DM 2: 150-175 fasting mornings.  Trying to do diabetic diet.  HTN: no home bp monitoring at home.  Compliant with lisinopril 5 mg qd.  He is resistant to cholesterol med start at this time but we'll continue to address this at future visits.  Pertinent PMH:  Past medical, surgical, social, and family history reviewed and no changes are noted since last office visit.  MEDS:  Outpatient Prescriptions Prior to Visit  Medication Sig Dispense Refill  . B Complex-C (SUPER B COMPLEX PO) Take 2 tablets by mouth daily.    . Cholecalciferol (VITAMIN D) 400 UNITS capsule Take 400 Units by mouth daily.    Marland Kitchen GARLIC PO Take 1 tablet by mouth daily.    Marland Kitchen ibuprofen (ADVIL,MOTRIN) 200 MG tablet Take 400 mg by mouth every 3 (three) hours as needed.    Marland Kitchen lisinopril (PRINIVIL,ZESTRIL) 5 MG tablet TAKE 1 TABLET BY MOUTH EVERY DAY 90 tablet 1  . magnesium 30 MG tablet Take 30 mg by mouth daily.    . metFORMIN (GLUCOPHAGE) 500 MG tablet TAKE 1 TABLET BY MOUTH TWICE DAILY WITH A MEAL 180 tablet 6  . OVER THE COUNTER MEDICATION B5 supplement, one a day    . senna (SENOKOT) 8.6 MG TABS Take 2 tablets by mouth.      . Blood Glucose Monitoring Suppl (ONE TOUCH ULTRA SYSTEM KIT) W/DEVICE KIT 1 kit by Does not apply route once. 100 each 3  . CINNAMON PO Take 1 tablet by mouth daily.    Glory Rosebush DELICA LANCETS MISC Check glucose twice daily 60 each 5  . OVER THE COUNTER MEDICATION Choline, one a day    . Red Yeast Rice 600 MG CAPS Take 2 capsules by mouth daily.    Marland Kitchen nystatin-triamcinolone ointment (MYCOLOG) Apply topically 2 (two) times daily. 30 g 2   No facility-administered medications prior to visit.    PE: Blood pressure 163/87, pulse 59, temperature 98 F (36.7 C), temperature source Temporal, resp. rate  18, height 5' 8.5" (1.74 m), weight 196 lb (88.905 kg), SpO2 95 %. Gen: Alert, well appearing.  Patient is oriented to person, place, time, and situation. AFFECT: pleasant, lucid thought and speech. No further exam today.  LABS: none today Recent: Lab Results  Component Value Date   HGBA1C 7.5* 01/22/2014     Chemistry      Component Value Date/Time   NA 139 09/17/2013 0914   K 4.2 09/17/2013 0914   CL 103 09/17/2013 0914   CO2 30 09/17/2013 0914   BUN 13 09/17/2013 0914   CREATININE 0.8 09/17/2013 0914   CREATININE 0.66 03/19/2013 1607      Component Value Date/Time   CALCIUM 10.2 09/17/2013 0914   ALKPHOS 88 09/17/2013 0914   AST 24 09/17/2013 0914   ALT 27 09/17/2013 0914   BILITOT 0.8 09/17/2013 0914     Lab Results  Component Value Date   WBC 7.7 09/17/2013   HGB 15.4 09/17/2013   HCT 46.0 09/17/2013   MCV 91.2 09/17/2013   PLT 185.0 09/17/2013   Lab Results  Component Value Date   TSH 2.01 09/17/2013   Lab Results  Component Value Date   PSA 0.55 05/11/2011   IMPRESSION AND PLAN:  1)  DM 2; control needs improving. Increase metformin to 1000 mg bid. HbA1c today. He declined flu and prevnar today. Eye exam overdue: pt encouraged to schedule this.  2) HTN: not ideal control.  Increase lisinopril to 10 mg qd.  3) Hyperlipidemia: he is resistant to meds for this but we'll keep discussing this as other problems come under better control. He is easily overwhelmed.  An After Visit Summary was printed and given to the patient.  FOLLOW UP: 63mo

## 2014-05-24 NOTE — Progress Notes (Signed)
Pre visit review using our clinic review tool, if applicable. No additional management support is needed unless otherwise documented below in the visit note. 

## 2014-06-07 ENCOUNTER — Other Ambulatory Visit: Payer: Self-pay | Admitting: Family Medicine

## 2014-06-28 ENCOUNTER — Other Ambulatory Visit: Payer: Self-pay | Admitting: Family Medicine

## 2014-07-06 ENCOUNTER — Encounter: Payer: Self-pay | Admitting: *Deleted

## 2014-09-20 ENCOUNTER — Ambulatory Visit: Payer: Medicare HMO | Admitting: Family Medicine

## 2014-09-23 ENCOUNTER — Ambulatory Visit (INDEPENDENT_AMBULATORY_CARE_PROVIDER_SITE_OTHER): Payer: Medicare HMO | Admitting: Family Medicine

## 2014-09-23 ENCOUNTER — Encounter: Payer: Self-pay | Admitting: Family Medicine

## 2014-09-23 VITALS — BP 144/90 | HR 65 | Temp 97.8°F | Resp 18 | Ht 68.5 in | Wt 196.0 lb

## 2014-09-23 DIAGNOSIS — E785 Hyperlipidemia, unspecified: Secondary | ICD-10-CM

## 2014-09-23 DIAGNOSIS — I10 Essential (primary) hypertension: Secondary | ICD-10-CM

## 2014-09-23 DIAGNOSIS — E119 Type 2 diabetes mellitus without complications: Secondary | ICD-10-CM

## 2014-09-23 DIAGNOSIS — R5383 Other fatigue: Secondary | ICD-10-CM

## 2014-09-23 LAB — CBC WITH DIFFERENTIAL/PLATELET
BASOS ABS: 0 10*3/uL (ref 0.0–0.1)
Basophils Relative: 0.5 % (ref 0.0–3.0)
Eosinophils Absolute: 0.2 10*3/uL (ref 0.0–0.7)
Eosinophils Relative: 3.2 % (ref 0.0–5.0)
HEMATOCRIT: 44 % (ref 39.0–52.0)
Hemoglobin: 14.8 g/dL (ref 13.0–17.0)
LYMPHS ABS: 1.7 10*3/uL (ref 0.7–4.0)
Lymphocytes Relative: 23.3 % (ref 12.0–46.0)
MCHC: 33.6 g/dL (ref 30.0–36.0)
MCV: 90.3 fl (ref 78.0–100.0)
MONO ABS: 0.6 10*3/uL (ref 0.1–1.0)
Monocytes Relative: 7.5 % (ref 3.0–12.0)
NEUTROS ABS: 4.9 10*3/uL (ref 1.4–7.7)
Neutrophils Relative %: 65.5 % (ref 43.0–77.0)
Platelets: 187 10*3/uL (ref 150.0–400.0)
RBC: 4.88 Mil/uL (ref 4.22–5.81)
RDW: 14 % (ref 11.5–15.5)
WBC: 7.5 10*3/uL (ref 4.0–10.5)

## 2014-09-23 LAB — COMPREHENSIVE METABOLIC PANEL
ALBUMIN: 4.3 g/dL (ref 3.5–5.2)
ALT: 25 U/L (ref 0–53)
AST: 18 U/L (ref 0–37)
Alkaline Phosphatase: 92 U/L (ref 39–117)
BUN: 14 mg/dL (ref 6–23)
CALCIUM: 10 mg/dL (ref 8.4–10.5)
CO2: 32 mEq/L (ref 19–32)
Chloride: 100 mEq/L (ref 96–112)
Creatinine, Ser: 0.72 mg/dL (ref 0.40–1.50)
GFR: 114.11 mL/min (ref >60.00)
Glucose, Bld: 145 mg/dL — ABNORMAL HIGH (ref 70–99)
POTASSIUM: 4.6 meq/L (ref 3.5–5.1)
Sodium: 137 mEq/L (ref 135–145)
Total Bilirubin: 0.5 mg/dL (ref 0.2–1.2)
Total Protein: 6.9 g/dL (ref 6.0–8.3)

## 2014-09-23 LAB — LIPID PANEL
CHOL/HDL RATIO: 5
Cholesterol: 212 mg/dL — ABNORMAL HIGH (ref 0–200)
HDL: 42.9 mg/dL (ref >39.00)
LDL Cholesterol: 135 mg/dL — ABNORMAL HIGH (ref 0–99)
NonHDL: 169.1
Triglycerides: 170 mg/dL — ABNORMAL HIGH (ref 0.0–149.0)
VLDL: 34 mg/dL (ref 0.0–40.0)

## 2014-09-23 LAB — TSH: TSH: 2.45 u[IU]/mL (ref 0.35–4.50)

## 2014-09-23 LAB — HEMOGLOBIN A1C: HEMOGLOBIN A1C: 7.4 % — AB (ref 4.6–6.5)

## 2014-09-23 NOTE — Progress Notes (Signed)
OFFICE NOTE  09/23/2014  CC:  Chief Complaint  Patient presents with  . Follow-up   HPI: Patient is a 74 y.o. Caucasian male who is here for 4 mo f/u DM 2, HTN. Glucose monitoring: fastings about 150 avg.  Occ check later in day when he doesn't feel well it is often in 270-300. Eats a fairly well modified diabetic/low fat diet.    Drowsiness quite a bit, sometimes notes high sugar when this is present.  Has had sleep study and he has no OSA. Exercise: walks some.  Some confusion at pharmacy so he's still been on 5 mg lisinopril instead of $RemoveBe'10mg'eCkyVwgIL$  like I increased him to at a recent visit.   Pertinent PMH:  Past medical, surgical, social, and family history reviewed and no changes are noted since last office visit.  MEDS:  Outpatient Prescriptions Prior to Visit  Medication Sig Dispense Refill  . B Complex-C (SUPER B COMPLEX PO) Take 2 tablets by mouth daily.    . Cholecalciferol (VITAMIN D) 400 UNITS capsule Take 400 Units by mouth daily.    Marland Kitchen GARLIC PO Take 1 tablet by mouth daily.    Marland Kitchen ibuprofen (ADVIL,MOTRIN) 200 MG tablet Take 400 mg by mouth every 3 (three) hours as needed.    Marland Kitchen lisinopril (PRINIVIL,ZESTRIL) 10 MG tablet Take 1 tablet (10 mg total) by mouth daily. 90 tablet 3  . magnesium 30 MG tablet Take 30 mg by mouth daily.    . metFORMIN (GLUCOPHAGE) 1000 MG tablet Take 1 tablet (1,000 mg total) by mouth 2 (two) times daily with a meal. 180 tablet 6  . OVER THE COUNTER MEDICATION B5 supplement, one a day    . senna (SENOKOT) 8.6 MG TABS Take 2 tablets by mouth.      . Blood Glucose Monitoring Suppl (ONE TOUCH ULTRA SYSTEM KIT) W/DEVICE KIT 1 kit by Does not apply route once. 100 each 3  . CINNAMON PO Take 1 tablet by mouth daily.    . ONE TOUCH ULTRA TEST test strip CHECK BLOOD GLUCOSE TWICE DAILY 100 each 1  . ONETOUCH DELICA LANCETS MISC Check glucose twice daily 60 each 5  . lisinopril (PRINIVIL,ZESTRIL) 5 MG tablet TAKE 1 TABLET BY MOUTH EVERY DAY 90 tablet 0  . OVER  THE COUNTER MEDICATION Choline, one a day    . Red Yeast Rice 600 MG CAPS Take 2 capsules by mouth daily.     No facility-administered medications prior to visit.    PE: Blood pressure 144/90, pulse 65, temperature 97.8 F (36.6 C), temperature source Temporal, resp. rate 18, height 5' 8.5" (1.74 m), weight 196 lb (88.905 kg), SpO2 95 %. Gen: Alert, well appearing.  Patient is oriented to person, place, time, and situation. AFFECT: pleasant, lucid thought and speech. Foot exam -  no swelling, tenderness or skin or vascular lesions. Color and temperature is normal. Sensation is intact. Peripheral pulses are palpable. Toenails are thickened.   IMPRESSION AND PLAN:  1) DM 2, not ideal control. Diet ok but he is in denial somewhat regarding what is actually a "good diet" and he does not exercise ANY--he does walk some days, though.  Feet exam normal today.  HbA1c today.  2) HTN; not ideal control. Needs to increase lisinopril to $RemoveBefor'10mg'QMcxGnrmMrtr$  qd like we had done at last visit.  3) Prev health care: recommended pneumonia vaccine and flu vaccine but pt declines these and does not EVER want them.  Will note this in chart.  4) Hyperlipidemia;  trying TLC but pt not doing anything very aggressive.  He continues to say he does NOT want to try a cholesterol lowering med even if cholesterol not improved on today's check. Declined pneumonia vaccines and doesn't want cholest-lowering med.  An After Visit Summary was printed and given to the patient.  FOLLOW UP: 4 mo--will do PSA/DRE at this visit

## 2014-09-23 NOTE — Progress Notes (Signed)
Pre visit review using our clinic review tool, if applicable. No additional management support is needed unless otherwise documented below in the visit note. 

## 2014-09-30 ENCOUNTER — Ambulatory Visit (INDEPENDENT_AMBULATORY_CARE_PROVIDER_SITE_OTHER): Payer: Medicare HMO | Admitting: Family Medicine

## 2014-09-30 ENCOUNTER — Encounter: Payer: Self-pay | Admitting: Family Medicine

## 2014-09-30 VITALS — BP 129/77 | HR 77 | Temp 98.1°F | Resp 18 | Ht 68.5 in | Wt 196.0 lb

## 2014-09-30 DIAGNOSIS — M722 Plantar fascial fibromatosis: Secondary | ICD-10-CM

## 2014-09-30 NOTE — Progress Notes (Signed)
OFFICE NOTE  09/30/2014  CC:  Chief Complaint  Patient presents with  . Foot Pain    left sided x one week    HPI: Patient is a 74 y.o. Caucasian male who is here for left heel pain. Onset about 1 week ago of pain in left heel but recalls no trauma/accident preceding it.  It is worse after sitting for a long time and when he first gets out of bed in the morning.  The pain is not as bad now as in the beginning. No other area of foot pain.   Denies hx of plantar foot pain prior to this.  Pertinent PMH:  Past medical, surgical, social, and family history reviewed and no changes are noted since last office visit.  MEDS:  Outpatient Prescriptions Prior to Visit  Medication Sig Dispense Refill  . B Complex-C (SUPER B COMPLEX PO) Take 2 tablets by mouth daily.    . Blood Glucose Monitoring Suppl (ONE TOUCH ULTRA SYSTEM KIT) W/DEVICE KIT 1 kit by Does not apply route once. 100 each 3  . Cholecalciferol (VITAMIN D) 400 UNITS capsule Take 400 Units by mouth daily.    Marland Kitchen CINNAMON PO Take 1 tablet by mouth daily.    Marland Kitchen GARLIC PO Take 1 tablet by mouth daily.    Marland Kitchen ibuprofen (ADVIL,MOTRIN) 200 MG tablet Take 400 mg by mouth every 3 (three) hours as needed.    Marland Kitchen lisinopril (PRINIVIL,ZESTRIL) 10 MG tablet Take 1 tablet (10 mg total) by mouth daily. 90 tablet 3  . magnesium 30 MG tablet Take 30 mg by mouth daily.    . metFORMIN (GLUCOPHAGE) 1000 MG tablet Take 1 tablet (1,000 mg total) by mouth 2 (two) times daily with a meal. 180 tablet 6  . ONETOUCH DELICA LANCETS MISC Check glucose twice daily 60 each 5  . OVER THE COUNTER MEDICATION B5 supplement, one a day    . senna (SENOKOT) 8.6 MG TABS Take 2 tablets by mouth.      . ONE TOUCH ULTRA TEST test strip CHECK BLOOD GLUCOSE TWICE DAILY 100 each 1   No facility-administered medications prior to visit.    PE: Blood pressure 129/77, pulse 77, temperature 98.1 F (36.7 C), temperature source Temporal, resp. rate 18, height 5' 8.5" (1.74 m), weight  196 lb (88.905 kg), SpO2 94 %. Gen: Alert, well appearing.  Patient is oriented to person, place, time, and situation. Left heel with quarter sized area of callus but he is minimally TTP over this.  No erythema or fluctuance. He has significant TTP on the medial aspect of the plantar fascia right where it inserts on the calcaneus (left foot). No tenderness of achilles tendon.  Ankle ROM intact.  Cap refill <2 sec.  IMPRESSION AND PLAN:  Left plantar fasciitis.   Discussed ibuprofen 600 mg bid x 5d, relative rest, use of OTC heel pad/orthotic. Printed out home exercise regimen for him to do for this condition. Discussed natural course of this problem, may wax/wane for as much as 6 mo no matter what we do. Steroid injection an option after several weeks IF conservative measures fail. Pt expressed understanding.  An After Visit Summary was printed and given to the patient.  FOLLOW UP: prn

## 2014-09-30 NOTE — Patient Instructions (Signed)
Buy an OTC orthotic heel pad (similar to the picture I gave you). Rest your foot. Take ibuprofen 600 mg twice a day for 5d with some food in your stomach.

## 2014-09-30 NOTE — Progress Notes (Signed)
Pre visit review using our clinic review tool, if applicable. No additional management support is needed unless otherwise documented below in the visit note. 

## 2014-10-07 ENCOUNTER — Other Ambulatory Visit: Payer: Self-pay | Admitting: Family Medicine

## 2014-11-30 ENCOUNTER — Other Ambulatory Visit: Payer: Self-pay | Admitting: Family Medicine

## 2015-01-23 ENCOUNTER — Encounter: Payer: Self-pay | Admitting: Family Medicine

## 2015-01-23 ENCOUNTER — Ambulatory Visit (INDEPENDENT_AMBULATORY_CARE_PROVIDER_SITE_OTHER): Payer: Medicare HMO | Admitting: Family Medicine

## 2015-01-23 VITALS — BP 137/79 | HR 57 | Temp 98.0°F | Resp 16 | Ht 68.5 in | Wt 191.0 lb

## 2015-01-23 DIAGNOSIS — H6122 Impacted cerumen, left ear: Secondary | ICD-10-CM

## 2015-01-23 DIAGNOSIS — I10 Essential (primary) hypertension: Secondary | ICD-10-CM

## 2015-01-23 DIAGNOSIS — E785 Hyperlipidemia, unspecified: Secondary | ICD-10-CM

## 2015-01-23 DIAGNOSIS — E119 Type 2 diabetes mellitus without complications: Secondary | ICD-10-CM

## 2015-01-23 LAB — MICROALBUMIN / CREATININE URINE RATIO
Creatinine,U: 51.8 mg/dL
Microalb Creat Ratio: 1.4 mg/g (ref 0.0–30.0)
Microalb, Ur: <0.7 mg/dL (ref 0.0–1.9)

## 2015-01-23 LAB — HEMOGLOBIN A1C: HEMOGLOBIN A1C: 7.4 % — AB (ref 4.6–6.5)

## 2015-01-23 NOTE — Progress Notes (Signed)
OFFICE VISIT  01/23/2015   CC:  Chief Complaint  Patient presents with  . Follow-up    Pt is fasting.   HPI:    Patient is a 74 y.o. Caucasian male who presents for f/u 4 mo f/u DM 2, HTN, hyperlipidemia. Pt has long declined statin/cholesterol med. Was supposed to have increased his lisinopril to 10mg qd last visit.   Some glucoses are near 200 lately fasting, one postprandial after he ate [place] was over 300.  He monitored bp's for a while after last check ON 5 MG lisinopril (they must not have d/c'd his 5mg dose at the pharmacy): avg 130s/80s. Pt has hearing impairment, wears hearing aids, says L ear feels like he's in a drum, asks me to look in it.  Past Medical History  Diagnosis Date  . HTN (hypertension), benign   . Hearing impairment     Occupational noise damage x years.  Has hearing aids AU.  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  . External hemorrhoids   . History of colon polyps 2009  . DM (diabetes mellitus) 05/2011  . Hyperlipidemia   . 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 Asthma     as child-no mdi use now  . Personal history of colonic adenomas 2009; 09/14/2012    Polypectomy 2010 and 2014.  . Plantar fasciitis     deep tissue laser tx at Dr. Boudreaux (chiro) helped A LOT!    Past Surgical History  Procedure Laterality Date  . Eye surgery      Right (s/p traumatic injury to eye)  . Circumcision  age 22  . Dental surgery      10+ root canals, several bridges  . Colonoscopy w/ polypectomy  2009; 2014    09/2012: tubular adenoma--no high grade dysplasia.  Repeat 09/2017 approx.  Diverticulosis and small int hem    Outpatient Prescriptions Prior to Visit  Medication Sig Dispense Refill  . B Complex-C (SUPER B COMPLEX PO) Take 2 tablets by mouth daily.    . Blood Glucose Monitoring Suppl (ONE TOUCH ULTRA SYSTEM KIT) W/DEVICE KIT 1 kit by Does not apply route once. 100 each 3  . Cholecalciferol  (VITAMIN D) 400 UNITS capsule Take 400 Units by mouth daily.    . CINNAMON PO Take 1 tablet by mouth daily.    . GARLIC PO Take 1 tablet by mouth daily.    . ibuprofen (ADVIL,MOTRIN) 200 MG tablet Take 400 mg by mouth every 3 (three) hours as needed.    . lisinopril (PRINIVIL,ZESTRIL) 5 MG tablet TAKE 1 TABLET BY MOUTH EVERY DAY 90 tablet 1  . magnesium 30 MG tablet Take 30 mg by mouth daily.    . metFORMIN (GLUCOPHAGE) 1000 MG tablet Take 1 tablet (1,000 mg total) by mouth 2 (two) times daily with a meal. 180 tablet 6  . ONE TOUCH ULTRA TEST test strip USE AS DIRECTED TO CHECK BLOOD GLUCOSE TWICE DAILY 100 each 3  . ONETOUCH DELICA LANCETS MISC Check glucose twice daily 60 each 5  . OVER THE COUNTER MEDICATION B5 supplement, one a day    . senna (SENOKOT) 8.6 MG TABS Take 2 tablets by mouth.      . lisinopril (PRINIVIL,ZESTRIL) 10 MG tablet Take 1 tablet (10 mg total) by mouth daily. (Patient not taking: Reported on 01/23/2015) 90 tablet 3   No facility-administered medications prior to visit.    Allergies  Allergen Reactions  .   Aspirin Other (See Comments)    Easy bruising    ROS As per HPI  PE: Blood pressure 137/79, pulse 57, temperature 98 F (36.7 C), temperature source Oral, resp. rate 16, height 5' 8.5" (1.74 m), weight 191 lb (86.637 kg), SpO2 93 %. Gen: Alert, well appearing.  Patient is oriented to person, place, time, and situation. EARS: L EAC clear, TM normal.  R EAC full of cerumen, deep in canal, can't see TM. CV: RRR, no m/r/g.   LUNGS: CTA bilat, nonlabored resps, good aeration in all lung fields. EXT: no clubbing, cyanosis, or edema.    LABS:  Lab Results  Component Value Date   TSH 2.45 09/23/2014   Lab Results  Component Value Date   WBC 7.5 09/23/2014   HGB 14.8 09/23/2014   HCT 44.0 09/23/2014   MCV 90.3 09/23/2014   PLT 187.0 09/23/2014   Lab Results  Component Value Date   CREATININE 0.72 09/23/2014   BUN 14 09/23/2014   NA 137 09/23/2014    K 4.6 09/23/2014   CL 100 09/23/2014   CO2 32 09/23/2014   Lab Results  Component Value Date   ALT 25 09/23/2014   AST 18 09/23/2014   ALKPHOS 92 09/23/2014   BILITOT 0.5 09/23/2014   Lab Results  Component Value Date   CHOL 212* 09/23/2014   Lab Results  Component Value Date   HDL 42.90 09/23/2014   Lab Results  Component Value Date   LDLCALC 135* 09/23/2014   Lab Results  Component Value Date   TRIG 170.0* 09/23/2014   Lab Results  Component Value Date   CHOLHDL 5 09/23/2014   Lab Results  Component Value Date   PSA 0.55 05/11/2011   Lab Results  Component Value Date   HGBA1C 7.4* 09/23/2014    IMPRESSION AND PLAN:  1) DM 2, sounds like control/dz worsening.  Also some dietary noncompliance but he is doing his best. A1c today as well as urine microalb/cr. He declined prevnar 13. No med changes or additions until I see A1c.  2) HTN; well controlled on just the 5mg lisinopril qd.  3) Hyperlipidemia; stable last check 4 mo ago.  Pt doesn't want statin or other cholesterol-lowering med. Continue to try to eat low carb/low fat diet, increase CV exercise.  4) Cerumen impaction, Left: irrigated 100% clear by nurse today.  An After Visit Summary was printed and given to the patient.  FOLLOW UP: Return in about 4 months (around 05/26/2015). Need to do DRE and PSA at that time.       

## 2015-01-23 NOTE — Progress Notes (Signed)
Pre visit review using our clinic review tool, if applicable. No additional management support is needed unless otherwise documented below in the visit note. 

## 2015-03-19 ENCOUNTER — Telehealth: Payer: Self-pay | Admitting: Family Medicine

## 2015-03-19 DIAGNOSIS — IMO0001 Reserved for inherently not codable concepts without codable children: Secondary | ICD-10-CM

## 2015-03-19 DIAGNOSIS — E1165 Type 2 diabetes mellitus with hyperglycemia: Principal | ICD-10-CM

## 2015-03-19 NOTE — Telephone Encounter (Signed)
Pt advised and voiced understanding.   

## 2015-03-19 NOTE — Telephone Encounter (Signed)
Pt. Is requesting a referral to a diabetes specialist as he unable to get his sugar where it needs to be. He says he takes 2000mg  a day and his numbers are still extremely high.He feels the Metformin is not working. He is available anytime or day put preferrs am's.

## 2015-03-19 NOTE — Telephone Encounter (Signed)
OK.   Referral ordered as per pt request. 

## 2015-03-19 NOTE — Telephone Encounter (Signed)
Please advise. Thanks.  

## 2015-04-02 ENCOUNTER — Encounter: Payer: Self-pay | Admitting: Endocrinology

## 2015-04-02 ENCOUNTER — Ambulatory Visit (INDEPENDENT_AMBULATORY_CARE_PROVIDER_SITE_OTHER): Payer: Medicare HMO | Admitting: Endocrinology

## 2015-04-02 VITALS — BP 136/88 | HR 59 | Temp 98.4°F | Ht 68.5 in | Wt 192.0 lb

## 2015-04-02 DIAGNOSIS — E119 Type 2 diabetes mellitus without complications: Secondary | ICD-10-CM | POA: Diagnosis not present

## 2015-04-02 LAB — POCT GLYCOSYLATED HEMOGLOBIN (HGB A1C): Hemoglobin A1C: 7.1

## 2015-04-02 MED ORDER — SITAGLIPTIN PHOSPHATE 100 MG PO TABS
100.0000 mg | ORAL_TABLET | Freq: Every day | ORAL | Status: DC
Start: 2015-04-02 — End: 2016-03-22

## 2015-04-02 NOTE — Patient Instructions (Addendum)
good diet and exercise significantly improve the control of your diabetes.  please let me know if you wish to be referred to a dietician.  high blood sugar is very risky to your health.  you should see an eye doctor and dentist every year.  It is very important to get all recommended vaccinations.  controlling your blood pressure and cholesterol drastically reduces the damage diabetes does to your body.  Those who smoke should quit.  please discuss these with your doctor.  check your blood sugar once a day.  vary the time of day when you check, between before the 3 meals, and at bedtime.  also check if you have symptoms of your blood sugar being too high or too low.  please keep a record of the readings and bring it to your next appointment here.  You can write it on any piece of paper.  please call us sooner if your blood sugar goes below 70, or if you have a lot of readings over 200. Please see a dietician specialist.   Please come back for a follow-up appointment in 2 months.   i have sent a prescription to your pharmacy, to add "[place]."

## 2015-04-02 NOTE — Progress Notes (Signed)
Subjective:    Patient ID: Matthew Ramirez, male    DOB: Aug 08, 1940, 74 y.o.   MRN: [id number]  HPI pt states DM was dx'ed in 2010; he has mild if any neuropathy of the lower extremities; he is unaware of any associated chronic complications; he has never been on insulin; pt says his diet and exercise are; he has never had pancreatitis, severe hypoglycemia or DKA.   Past Medical History  Diagnosis Date  . HTN (hypertension), benign   . Hearing impairment     Occupational noise damage x years.  Has hearing aids AU.  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  Marland Kitchen External hemorrhoids   . History of colon polyps 2009  . DM (diabetes mellitus) 05/2011  . Hyperlipidemia   . 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 Asthma     as child-no mdi use now  . Personal history of colonic adenomas 2009; 09/14/2012    Polypectomy 2010 and 2014.  Marland Kitchen Plantar fasciitis     deep tissue laser tx at Dr. Emily Filbert (chiro) helped A LOT!    Past Surgical History  Procedure Laterality Date  . Eye surgery      Right (s/p traumatic injury to eye)  . Circumcision  age 20  . Dental surgery      10+ root canals, several bridges  . Colonoscopy w/ polypectomy  2009; 2014    09/2012: tubular adenoma--no high grade dysplasia.  Repeat 09/2017 approx.  Diverticulosis and small int hem    Social History   Social History  . Marital Status: Married    Spouse Name: N/A  . Number of Children: N/A  . Years of Education: N/A   Occupational History  . Not on file.   Social History Main Topics  . Smoking status: Former Smoker -- 1.00 packs/day for 20 years    Types: Cigarettes    Quit date: 07/12/1968  . Smokeless tobacco: Never Used  . Alcohol Use: Yes     Comment: a couple of drinks a week  . Drug Use: No  . Sexual Activity: Not on file   Other Topics Concern  . Not on file   Social History Narrative   Married, has 4 grown daughters, 15 grandchildren, and  1 great grandchild.   Occupation: Chief Financial Officer.  Originally from [facility] [place], has lived in [place] since age 33 yrs.   Distant tobacco abuse: quit 1970.  Rare ETOH intake, no hx of ETOH abuse.  No drug use.   Walks about 4 miles per day.    Current Outpatient Prescriptions on File Prior to Visit  Medication Sig Dispense Refill  . B Complex-C (SUPER B COMPLEX PO) Take 2 tablets by mouth daily.    . Blood Glucose Monitoring Suppl (ONE TOUCH ULTRA SYSTEM KIT) W/DEVICE KIT 1 kit by Does not apply route once. 100 each 3  . Cholecalciferol (VITAMIN D) 400 UNITS capsule Take 400 Units by mouth daily.    Marland Kitchen CINNAMON PO Take 1 tablet by mouth daily.    Marland Kitchen GARLIC PO Take 1 tablet by mouth daily.    Marland Kitchen ibuprofen (ADVIL,MOTRIN) 200 MG tablet Take 400 mg by mouth every 3 (three) hours as needed.    Marland Kitchen lisinopril (PRINIVIL,ZESTRIL) 5 MG tablet TAKE 1 TABLET BY MOUTH EVERY DAY 90 tablet 1  . magnesium 30 MG tablet Take 30 mg by mouth daily.    . metFORMIN (GLUCOPHAGE) 1000 MG tablet Take 1  tablet (1,000 mg total) by mouth 2 (two) times daily with a meal. 180 tablet 6  . ONE TOUCH ULTRA TEST test strip USE AS DIRECTED TO CHECK BLOOD GLUCOSE TWICE DAILY 100 each 3  . ONETOUCH DELICA LANCETS MISC Check glucose twice daily 60 each 5  . OVER THE COUNTER MEDICATION B5 supplement, one a day    . senna (SENOKOT) 8.6 MG TABS Take 2 tablets by mouth.       No current facility-administered medications on file prior to visit.    Allergies  Allergen Reactions  . Aspirin Other (See Comments)    Easy bruising    Family History  Problem Relation Age of Onset  . Cancer Mother     leukemia  . Cancer Sister     ovarian  . Colon cancer Neg Hx   . Rectal cancer Neg Hx   . Stomach cancer Neg Hx   . Emphysema Father     smoker  . Diabetes Mother   . Diabetes Sister     BP 136/88 mmHg  Pulse 59  Temp(Src) 98.4 F (36.9 C) (Oral)  Ht 5' 8.5" (1.74 m)  Wt 192 lb (87.091 kg)  BMI 28.77 kg/m2   SpO2 94%  Review of Systems denies weight loss, blurry vision, headache, chest pain, sob, n/v, urinary frequency, muscle cramps, excessive diaphoresis, depression, cold intolerance, and rhinorrhea.  He has fatigue and easy bruising.      Objective:   Physical Exam VS: see vs page GEN: no distress HEAD: head: no deformity eyes: no periorbital swelling, no proptosis external nose and ears are normal mouth: no lesion seen Ears: bilat hearing aids NECK: supple, thyroid is not enlarged CHEST WALL: no deformity LUNGS: clear to auscultation BREASTS:  No gynecomastia CV: reg rate and rhythm, no murmur ABD: abdomen is soft, nontender.  no hepatosplenomegaly.  not distended.  no hernia MUSCULOSKELETAL: muscle bulk and strength are grossly normal.  no obvious joint swelling.  gait is normal and steady EXTEMITIES: no deformity.  no ulcer on the feet.  feet are of normal color and temp.  Trace bilat leg edema, and varicosities.  There is bilateral onychomycosis of the toenails.  PULSES: dorsalis pedis intact bilat.  no carotid bruit.  NEURO:  cn 2-12 grossly intact.   readily moves all 4's.  sensation is intact to touch on the feet SKIN:  Normal texture and temperature.  No rash or suspicious lesion is visible.   NODES:  None palpable at the neck PSYCH: alert, well-oriented.  Does not appear anxious nor depressed.  A1c=7.1%     Assessment & Plan:  DM: Needs increased rx, if it can be done with a regimen that avoids or minimizes hypoglycemia. Edema: new to me: this limits oral DM rx options.    Patient is advised the following: Patient Instructions  good diet and exercise significantly improve the control of your diabetes.  please let me know if you wish to be referred to a dietician.  high blood sugar is very risky to your health.  you should see an eye doctor and dentist every year.  It is very important to get all recommended vaccinations.  controlling your blood pressure and cholesterol  drastically reduces the damage diabetes does to your body.  Those who smoke should quit.  please discuss these with your doctor.  check your blood sugar once a day.  vary the time of day when you check, between before the 3 meals, and at bedtime.  also check if you have symptoms of your blood sugar being too high or too low.  please keep a record of the readings and bring it to your next appointment here.  You can write it on any piece of paper.  please call us sooner if your blood sugar goes below 70, or if you have a lot of readings over 200. Please see a dietician specialist.   Please come back for a follow-up appointment in 2 months.   i have sent a prescription to your pharmacy, to add "[place]."

## 2015-04-03 NOTE — Progress Notes (Signed)
   Subjective:    Patient ID: Matthew Ramirez, male    DOB: [birth year], 74 y.o.   MRN: [id number]  HPI    Review of Systems     Objective:   Physical Exam    I have reviewed outside records, and summarized: This was noted: some glucoses are near 200 lately fasting, one postprandial after he ate [place] was over 300.       Assessment & Plan:

## 2015-04-28 DIAGNOSIS — R69 Illness, unspecified: Secondary | ICD-10-CM | POA: Diagnosis not present

## 2015-05-27 ENCOUNTER — Ambulatory Visit (INDEPENDENT_AMBULATORY_CARE_PROVIDER_SITE_OTHER): Payer: Medicare HMO | Admitting: Family Medicine

## 2015-05-27 ENCOUNTER — Encounter: Payer: Self-pay | Admitting: Family Medicine

## 2015-05-27 VITALS — BP 137/79 | HR 67 | Temp 98.6°F | Resp 16 | Ht 68.5 in | Wt 191.0 lb

## 2015-05-27 DIAGNOSIS — E785 Hyperlipidemia, unspecified: Secondary | ICD-10-CM | POA: Diagnosis not present

## 2015-05-27 DIAGNOSIS — E119 Type 2 diabetes mellitus without complications: Secondary | ICD-10-CM | POA: Diagnosis not present

## 2015-05-27 DIAGNOSIS — I10 Essential (primary) hypertension: Secondary | ICD-10-CM | POA: Diagnosis not present

## 2015-05-27 DIAGNOSIS — Z125 Encounter for screening for malignant neoplasm of prostate: Secondary | ICD-10-CM | POA: Diagnosis not present

## 2015-05-27 LAB — LIPID PANEL
Cholesterol: 186 mg/dL (ref 125–200)
HDL: 41 mg/dL (ref >=40)
LDL CALC: 105 mg/dL (ref <130)
Total CHOL/HDL Ratio: 4.5 Ratio (ref <=5.0)
Triglycerides: 202 mg/dL — ABNORMAL HIGH (ref <150)
VLDL: 40 mg/dL — ABNORMAL HIGH (ref <30)

## 2015-05-27 LAB — BASIC METABOLIC PANEL
BUN: 12 mg/dL (ref 7–25)
CHLORIDE: 101 mmol/L (ref 98–110)
CO2: 30 mmol/L (ref 20–31)
Calcium: 9.8 mg/dL (ref 8.6–10.3)
Creat: 0.66 mg/dL — ABNORMAL LOW (ref 0.70–1.18)
Glucose, Bld: 133 mg/dL — ABNORMAL HIGH (ref 65–99)
Potassium: 4.4 mmol/L (ref 3.5–5.3)
SODIUM: 140 mmol/L (ref 135–146)

## 2015-05-27 NOTE — Progress Notes (Signed)
OFFICE VISIT  05/27/2015   CC:  Chief Complaint  Patient presents with  . Follow-up    Pt is fasting.     HPI:    Patient is a 74 y.o. Caucasian male who presents for 4 mo f/u DM 2, HTN, HLD.  HLD: refuses statins/cholesterol meds.  Tries to eat low chol diet but admits he has questions for nutritionist he'll be seeing soon.  He is fasting and requests repeat of lipids today. Lab Results  Component Value Date   CHOL 212* 09/23/2014   HDL 42.90 09/23/2014   LDLCALC 135* 09/23/2014   TRIG 170.0* 09/23/2014   CHOLHDL 5 09/23/2014   DM 2: went to endocrinologist (Dr. Loanne Drilling) 03/2015, Celesta Gentile was added, his A1c was 7.1% at that time. Glucoses around 150's lately.  Has appt with nutritionist soon.  HTN: compliant with med.  Checks bp at home occasionally but not since 12/2014.  Prost ca screening: he is in agreement with stopping these screenings at this time.   Past Medical History  Diagnosis Date  . HTN (hypertension), benign   . Hearing impairment     Occupational noise damage x years.  Has hearing aids AU.  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  Marland Kitchen External hemorrhoids   . History of colon polyps 2009  . DM (diabetes mellitus) ([place]) 05/2011  . Hyperlipidemia   . 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 Asthma     as child-no mdi use now  . Personal history of colonic adenomas 2009; 09/14/2012    Polypectomy 2010 and 2014.  Marland Kitchen Plantar fasciitis     deep tissue laser tx at Dr. Emily Filbert (chiro) helped A LOT!    Past Surgical History  Procedure Laterality Date  . Eye surgery      Right (s/p traumatic injury to eye)  . Circumcision  age 41  . Dental surgery      10+ root canals, several bridges  . Colonoscopy w/ polypectomy  2009; 2014    09/2012: tubular adenoma--no high grade dysplasia.  Repeat 09/2017 approx.  Diverticulosis and small int hem    Outpatient Prescriptions Prior to Visit  Medication Sig Dispense  Refill  . B Complex-C (SUPER B COMPLEX PO) Take 2 tablets by mouth daily.    . Blood Glucose Monitoring Suppl (ONE TOUCH ULTRA SYSTEM KIT) W/DEVICE KIT 1 kit by Does not apply route once. 100 each 3  . Cholecalciferol (VITAMIN D) 400 UNITS capsule Take 400 Units by mouth daily.    Marland Kitchen GARLIC PO Take 1 tablet by mouth daily.    Marland Kitchen ibuprofen (ADVIL,MOTRIN) 200 MG tablet Take 400 mg by mouth every 3 (three) hours as needed.    Marland Kitchen lisinopril (PRINIVIL,ZESTRIL) 5 MG tablet TAKE 1 TABLET BY MOUTH EVERY DAY 90 tablet 1  . magnesium 30 MG tablet Take 30 mg by mouth daily.    . metFORMIN (GLUCOPHAGE) 1000 MG tablet Take 1 tablet (1,000 mg total) by mouth 2 (two) times daily with a meal. 180 tablet 6  . ONE TOUCH ULTRA TEST test strip USE AS DIRECTED TO CHECK BLOOD GLUCOSE TWICE DAILY 100 each 3  . ONETOUCH DELICA LANCETS MISC Check glucose twice daily 60 each 5  . OVER THE COUNTER MEDICATION B5 supplement, one a day    . senna (SENOKOT) 8.6 MG TABS Take 2 tablets by mouth.      . sitaGLIPtin (JANUVIA) 100 MG tablet Take 1 tablet (100 mg total)  by mouth daily. 30 tablet 11  . CINNAMON PO Take 1 tablet by mouth daily.     No facility-administered medications prior to visit.    Allergies  Allergen Reactions  . Aspirin Other (See Comments)    Easy bruising    ROS As per HPI  PE: Blood pressure 137/79, pulse 67, temperature 98.6 F (37 C), temperature source Oral, resp. rate 16, height 5' 8.5" (1.74 m), weight 191 lb (86.637 kg), SpO2 95 %. Gen: Alert, well appearing.  Patient is oriented to person, place, time, and situation. Neck: supple/nontender.  No LAD, mass, or TM.  Carotid pulses 2+ bilaterally, without bruits. CV: RRR, distant S1 and S2, no m/r/g.   LUNGS: CTA bilat, nonlabored resps, good aeration in all lung fields. EXT: no clubbing, cyanosis, or edema.    LABS:  Lab Results  Component Value Date   HGBA1C 7.1 04/02/2015     Chemistry      Component Value Date/Time   NA 137  09/23/2014 0843   K 4.6 09/23/2014 0843   CL 100 09/23/2014 0843   CO2 32 09/23/2014 0843   BUN 14 09/23/2014 0843   CREATININE 0.72 09/23/2014 0843   CREATININE 0.66 03/19/2013 1607      Component Value Date/Time   CALCIUM 10.0 09/23/2014 0843   ALKPHOS 92 09/23/2014 0843   AST 18 09/23/2014 0843   ALT 25 09/23/2014 0843   BILITOT 0.5 09/23/2014 0843     Lab Results  Component Value Date   WBC 7.5 09/23/2014   HGB 14.8 09/23/2014   HCT 44.0 09/23/2014   MCV 90.3 09/23/2014   PLT 187.0 09/23/2014   Lab Results  Component Value Date   PSA 0.55 05/11/2011     IMPRESSION AND PLAN:  1) Hyperlipidemia: refuses meds, diet only, to see nutritionist again soon. Recheck FLP today.  2) HTN; The current medical regimen is effective;  continue present plan and medications. Lytes/cr today.  3) DM 2: looks like he has decided to start going to endocrinologist to be followed for this. Continue current meds, keep nutritionist appt and f/u with endo.  4) Prostate ca screening: no further screenings to be done as per our conversation today.  An After Visit Summary was printed and given to the patient.   FOLLOW UP: Return in about 6 months (around 11/24/2015) for medicare AWV.

## 2015-05-27 NOTE — Progress Notes (Signed)
Pre visit review using our clinic review tool, if applicable. No additional management support is needed unless otherwise documented below in the visit note. 

## 2015-06-01 ENCOUNTER — Other Ambulatory Visit: Payer: Self-pay | Admitting: Family Medicine

## 2015-06-02 ENCOUNTER — Ambulatory Visit: Payer: Medicare HMO | Admitting: Endocrinology

## 2015-06-02 NOTE — Telephone Encounter (Signed)
RF request for lisinopril LOV: 05/27/15 Next ov: 11/26/15  Last written: 12/02/14 #90 w/ 1RF

## 2015-06-12 ENCOUNTER — Ambulatory Visit (INDEPENDENT_AMBULATORY_CARE_PROVIDER_SITE_OTHER): Payer: Medicare HMO | Admitting: Endocrinology

## 2015-06-12 ENCOUNTER — Encounter: Payer: Medicare HMO | Attending: Endocrinology | Admitting: Dietician

## 2015-06-12 ENCOUNTER — Encounter: Payer: Self-pay | Admitting: Endocrinology

## 2015-06-12 ENCOUNTER — Encounter: Payer: Self-pay | Admitting: Dietician

## 2015-06-12 VITALS — BP 140/83 | HR 69 | Temp 98.1°F | Ht 68.5 in | Wt 191.0 lb

## 2015-06-12 VITALS — Ht 68.5 in | Wt 191.0 lb

## 2015-06-12 DIAGNOSIS — E119 Type 2 diabetes mellitus without complications: Secondary | ICD-10-CM

## 2015-06-12 LAB — POCT GLYCOSYLATED HEMOGLOBIN (HGB A1C): Hemoglobin A1C: 6.7

## 2015-06-12 NOTE — Patient Instructions (Signed)
Please continue the same medications. check your blood sugar 2-3 times per week.  vary the time of day when you check, between before the 3 meals, and at bedtime.  also check if you have symptoms of your blood sugar being too high or too low.  please keep a record of the readings and bring it to your next appointment here (or you can bring the meter itself).  You can write it on any piece of paper.  please call us sooner if your blood sugar goes below 70, or if you have a lot of readings over 200.  Please come back for a follow-up appointment in 3-4 months.

## 2015-06-12 NOTE — Progress Notes (Signed)
Diabetes Self-Management Education  Visit Type: First/Initial  Appt. Start Time: 0845 Appt. End Time: 0945  06/12/2015  Matthew Ramirez, identified by name and date of birth, is a 74 y.o. male with a diagnosis of Diabetes: Type 2. For the past 3 years.  Patient is here alone.  He lives with his wife.  She does most of the cooking.  He typically eats 2 meals per day and 2 snacks.  He works as an Clinical biochemist.  Morning blood sugar is usually 150-170.  He gets symptomatic for low in the 90's.  Usual weight is 180 lbs without clothes (earlier this week).  Highest weight 210 lbs.  ASSESSMENT  Height 5' 8.5" (1.74 m), weight 191 lb (86.637 kg). Body mass index is 28.62 kg/(m^2).      Diabetes Self-Management Education - 06/12/15 0853    Visit Information   Visit Type First/Initial   Initial Visit   Diabetes Type Type 2   Are you currently following a meal plan? No   Are you taking your medications as prescribed? Yes   Date Diagnosed 2012   Health Coping   How would you rate your overall health? Excellent   Psychosocial Assessment   Patient Belief/Attitude about Diabetes Motivated to manage diabetes   Self-care barriers None   Self-management support Doctor's office;Family   Other persons present Patient;Friend   Patient Concerns Nutrition/Meal planning   Preferred Learning Style No preference indicated   Learning Readiness Not Ready   How often do you need to have someone help you when you read instructions, pamphlets, or other written materials from your doctor or pharmacy? 1 - Never   What is the last grade level you completed in school? [id number] grade   Complications   Last HgB A1C per patient/outside source 6.7 %  06/12/15   How often do you check your blood sugar? 1-2 times/day   Fasting Blood glucose range (mg/dL) 130-179   Number of hypoglycemic episodes per month 1   Can you tell when your blood sugar is low? Yes   What do you do if your blood sugar is low? drank juice    Number of hyperglycemic episodes per week 1   Can you tell when your blood sugar is high? Yes   What do you do if your blood sugar is high? nothing   Have you had a dilated eye exam in the past 12 months? Yes   Have you had a dental exam in the past 12 months? No   Are you checking your feet? No   Dietary Intake   Breakfast egg and cheese [employer] sandwich OR boiled eggs OR 2 eggs, bacon, grits, 2 slices toast with SF syrup ALWAYS with fruit  10-10:30   Snack (morning) none   Lunch Fruit, nuts   Snack (afternoon) none usually.  Rare (1x/month) apple fritter   Dinner venison or chicken (grilled), potatoes, beans, green beans or other vegetables. OR occasional pizza OR [employer], small portion dessert occasionally (or dessert made with stevia)  wife cooks  5:30-6   Snack (evening) sugar free popsickle   Beverage(s) 4-5 cups black coffee   Exercise   Exercise Type Moderate (swimming / aerobic walking)  cuts wood, lawn, raking, stopped walking this summer secondary to plantar faciitis.   How many days per week to you exercise? 6   How many minutes per day do you exercise? 60   Total minutes per week of exercise 360   Patient Education   Previous  Diabetes Education Yes (please comment)  2012 with Jeanie Sewer   Disease state  Definition of diabetes, type 1 and 2, and the diagnosis of diabetes;Explored patient's options for treatment of their diabetes   Nutrition management  Role of diet in the treatment of diabetes and the relationship between the three main macronutrients and blood glucose level;Food label reading, portion sizes and measuring food.;Meal options for control of blood glucose level and chronic complications.;Information on hints to eating out and maintain blood glucose control.;Meal timing in regards to the patients' current diabetes medication.   Physical activity and exercise  Role of exercise on diabetes management, blood pressure control and cardiac health.   Monitoring  Identified appropriate SMBG and/or A1C goals.;Daily foot exams;Yearly dilated eye exam   Acute complications Taught treatment of hypoglycemia - the 15 rule.   Chronic complications Relationship between chronic complications and blood glucose control;Assessed and discussed foot care and prevention of foot problems;Dental care   Individualized Goals (developed by patient)   Nutrition General guidelines for healthy choices and portions discussed   Physical Activity Exercise 5-7 days per week;60 minutes per day   Medications take my medication as prescribed   Monitoring  test my blood glucose as discussed   Outcomes   Expected Outcomes Demonstrated interest in learning. Expect positive outcomes   Future DMSE PRN   Program Status Completed   Subsequent Visit   Since your last visit have you continued or begun to take your medications as prescribed? Yes      Individualized Plan for Diabetes Self-Management Training:   Learning Objective:  Patient will have a greater understanding of diabetes self-management. Patient education plan is to attend individual and/or group sessions per assessed needs and concerns.   Plan:   Patient Instructions  Plan:  Aim for 4 Carb Choices per meal (60 grams) +/- 1 either way  Aim for 0-2 Carbs per snack if hungry  Include protein in moderation with your meals and snacks Consider reading food labels for Total Carbohydrate and Fat Grams of foods Continue your active lifestyle.  Get some form of exercise most days. Consider checking BG at alternate times per day as directed by MD  Continue taking your medication.    Expected Outcomes:  Demonstrated interest in learning. Expect positive outcomes  Education material provided: Living Well with Diabetes, Food label handouts, Meal plan card and Snack sheet  If problems or questions, patient to contact team via:  Phone and Email  Future DSME appointmen: PRN

## 2015-06-12 NOTE — Patient Instructions (Signed)
Plan:  Aim for 4 Carb Choices per meal (60 grams) +/- 1 either way  Aim for 0-2 Carbs per snack if hungry  Include protein in moderation with your meals and snacks Consider reading food labels for Total Carbohydrate and Fat Grams of foods Continue your active lifestyle.  Get some form of exercise most days. Consider checking BG at alternate times per day as directed by MD  Continue taking your medication.

## 2015-06-12 NOTE — Progress Notes (Signed)
Subjective:    Patient ID: Matthew Ramirez, male    DOB: 1940-08-09, 74 y.o.   MRN: [id number]  HPI Pt returns for f/u of diabetes mellitus: DM type: 2 Dx'ed: 9983 Complications: none Therapy: 2 oral meds. DKA: never Severe hypoglycemia: never Pancreatitis: never Other: he has never been on insulin; edema limits oral DM options Interval history:  pt states he feels well in general.  he says cbg's are well-controlled. Past Medical History  Diagnosis Date  . HTN (hypertension), benign   . Hearing impairment     Occupational noise damage x years.  Has hearing aids AU.  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  Marland Kitchen External hemorrhoids   . History of colon polyps 2009  . DM (diabetes mellitus) ([place]) 05/2011  . Hyperlipidemia   . 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 Asthma     as child-no mdi use now  . Personal history of colonic adenomas 2009; 09/14/2012    Polypectomy 2010 and 2014.  Marland Kitchen Plantar fasciitis     deep tissue laser tx at Dr. Emily Filbert (chiro) helped A LOT!    Past Surgical History  Procedure Laterality Date  . Eye surgery      Right (s/p traumatic injury to eye)  . Circumcision  age 41  . Dental surgery      10+ root canals, several bridges  . Colonoscopy w/ polypectomy  2009; 2014    09/2012: tubular adenoma--no high grade dysplasia.  Repeat 09/2017 approx.  Diverticulosis and small int hem    Social History   Social History  . Marital Status: Married    Spouse Name: N/A  . Number of Children: N/A  . Years of Education: N/A   Occupational History  . Not on file.   Social History Main Topics  . Smoking status: Former Smoker -- 1.00 packs/day for 20 years    Types: Cigarettes    Quit date: 07/12/1968  . Smokeless tobacco: Never Used  . Alcohol Use: Yes     Comment: a couple of drinks a week  . Drug Use: No  . Sexual Activity: Not on file   Other Topics Concern  . Not on file   Social History  Narrative   Married, has 4 grown daughters, 15 grandchildren, and 1 great grandchild.   Occupation: Chief Financial Officer.  Originally from [facility], has lived in [place] since age 63 yrs.   Distant tobacco abuse: quit 1970.  Rare ETOH intake, no hx of ETOH abuse.  No drug use.   Walks about 4 miles per day.    Current Outpatient Prescriptions on File Prior to Visit  Medication Sig Dispense Refill  . B Complex-C (SUPER B COMPLEX PO) Take 2 tablets by mouth daily.    . Blood Glucose Monitoring Suppl (ONE TOUCH ULTRA SYSTEM KIT) W/DEVICE KIT 1 kit by Does not apply route once. 100 each 3  . Cholecalciferol (VITAMIN D) 400 UNITS capsule Take 400 Units by mouth daily.    Marland Kitchen GARLIC PO Take 1 tablet by mouth daily.    Marland Kitchen ibuprofen (ADVIL,MOTRIN) 200 MG tablet Take 400 mg by mouth every 3 (three) hours as needed.    Marland Kitchen lisinopril (PRINIVIL,ZESTRIL) 5 MG tablet TAKE 1 TABLET BY MOUTH EVERY DAY 90 tablet 3  . magnesium 30 MG tablet Take 30 mg by mouth daily.    . metFORMIN (GLUCOPHAGE) 1000 MG tablet Take 1 tablet (1,000 mg total) by mouth 2 (  two) times daily with a meal. 180 tablet 6  . ONE TOUCH ULTRA TEST test strip USE AS DIRECTED TO CHECK BLOOD GLUCOSE TWICE DAILY 100 each 3  . ONETOUCH DELICA LANCETS MISC Check glucose twice daily 60 each 5  . OVER THE COUNTER MEDICATION B5 supplement, one a day    . senna (SENOKOT) 8.6 MG TABS Take 2 tablets by mouth.      . sitaGLIPtin (JANUVIA) 100 MG tablet Take 1 tablet (100 mg total) by mouth daily. 30 tablet 11   No current facility-administered medications on file prior to visit.    Allergies  Allergen Reactions  . Aspirin Other (See Comments)    Easy bruising    Family History  Problem Relation Age of Onset  . Cancer Mother     leukemia  . Cancer Sister     ovarian  . Colon cancer Neg Hx   . Rectal cancer Neg Hx   . Stomach cancer Neg Hx   . Emphysema Father     smoker  . Diabetes Mother   . Diabetes Sister     BP 140/83  mmHg  Pulse 69  Temp(Src) 98.1 F (36.7 C) (Oral)  Ht 5' 8.5" (1.74 m)  Wt 191 lb (86.637 kg)  BMI 28.62 kg/m2  SpO2 93%  Review of Systems She denies hypoglycemia     Objective:   Physical Exam VITAL SIGNS:  See vs page GENERAL: no distress Pulses: dorsalis pedis intact bilat.   MSK: no deformity of the feet CV: trace bilat leg edema, and varicosities of the ankles Skin:  no ulcer on the feet.  normal color and temp on the feet. Neuro: sensation is intact to touch on the feet Ext: There is bilateral onychomycosis of the toenails    A1c=6.7%    Assessment & Plan:  DM: well-controlled.  Patient is advised the following: Patient Instructions  Please continue the same medications. check your blood sugar 2-3 times per week.  vary the time of day when you check, between before the 3 meals, and at bedtime.  also check if you have symptoms of your blood sugar being too high or too low.  please keep a record of the readings and bring it to your next appointment here (or you can bring the meter itself).  You can write it on any piece of paper.  please call us sooner if your blood sugar goes below 70, or if you have a lot of readings over 200.  Please come back for a follow-up appointment in 3-4 months.

## 2015-06-27 DIAGNOSIS — R69 Illness, unspecified: Secondary | ICD-10-CM | POA: Diagnosis not present

## 2015-07-02 ENCOUNTER — Other Ambulatory Visit: Payer: Self-pay | Admitting: Family Medicine

## 2015-07-07 ENCOUNTER — Other Ambulatory Visit: Payer: Self-pay | Admitting: Endocrinology

## 2015-07-11 ENCOUNTER — Ambulatory Visit (INDEPENDENT_AMBULATORY_CARE_PROVIDER_SITE_OTHER): Payer: Medicare HMO | Admitting: Family Medicine

## 2015-07-11 ENCOUNTER — Encounter: Payer: Self-pay | Admitting: Family Medicine

## 2015-07-11 VITALS — BP 128/79 | HR 104 | Temp 98.7°F | Resp 16 | Ht 68.5 in | Wt 193.5 lb

## 2015-07-11 DIAGNOSIS — L309 Dermatitis, unspecified: Secondary | ICD-10-CM

## 2015-07-11 MED ORDER — PREDNISONE 20 MG PO TABS: ORAL_TABLET | ORAL | Status: DC

## 2015-07-11 NOTE — Progress Notes (Signed)
OFFICE VISIT  07/14/2015   CC:  Chief Complaint  Patient presents with  . Rash    x 2 weeks   HPI:    Patient is a 74 y.o. Caucasian male who presents for very itchy rash, onset about 2 wks ago.  Started on lower legs and has gradually spread up to involve entire trunk, very itchy.  For the most part, his upper legs and upper arms are spared.  Rare itchy spot on hands.  He shows me a photo of LL's from the initial onset and there are a few clear vesicles present.   He has chronic mild onychomycosis. OTC cortisone 10 applied intermittently w/out effect. Lower legs feel swollen and tight more with this.  No new meds and no potential contact irritants or allergens, no new foods.  No recent viral URI or GE prior to or associated with the rash.  No fevers/malaise.  No joint or muscle aches, no joint swelling.  NO swelling of lips, tongue, throat.  No SOB/wheezing or chest pains.  NO abd pains, n/v or diarrhea.    He sleeps with his wife and she has no lesions.  Took benadryl 2 nights ago and it helped him rest.  Past Medical History  Diagnosis Date  . HTN (hypertension), benign   . Hearing impairment     Occupational noise damage x years.  Has hearing aids AU.  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  Marland Kitchen External hemorrhoids   . History of colon polyps 2009  . DM (diabetes mellitus) ([place]) 05/2011  . Hyperlipidemia   . 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 Asthma     as child-no mdi use now  . Personal history of colonic adenomas 2009; 09/14/2012    Polypectomy 2010 and 2014.  Marland Kitchen Plantar fasciitis     deep tissue laser tx at Dr. Emily Filbert (chiro) helped A LOT!    Past Surgical History  Procedure Laterality Date  . Eye surgery      Right (s/p traumatic injury to eye)  . Circumcision  age 68  . Dental surgery      10+ root canals, several bridges  . Colonoscopy w/ polypectomy  2009; 2014    09/2012: tubular adenoma--no high grade  dysplasia.  Repeat 09/2017 approx.  Diverticulosis and small int hem    Outpatient Prescriptions Prior to Visit  Medication Sig Dispense Refill  . B Complex-C (SUPER B COMPLEX PO) Take 2 tablets by mouth daily.    . Blood Glucose Monitoring Suppl (ONE TOUCH ULTRA SYSTEM KIT) W/DEVICE KIT 1 kit by Does not apply route once. 100 each 3  . Cholecalciferol (VITAMIN D) 400 UNITS capsule Take 400 Units by mouth daily.    Marland Kitchen GARLIC PO Take 1 tablet by mouth daily.    Marland Kitchen ibuprofen (ADVIL,MOTRIN) 200 MG tablet Take 400 mg by mouth every 3 (three) hours as needed.    Marland Kitchen lisinopril (PRINIVIL,ZESTRIL) 5 MG tablet TAKE 1 TABLET BY MOUTH EVERY DAY 90 tablet 3  . magnesium 30 MG tablet Take 30 mg by mouth daily.    . metFORMIN (GLUCOPHAGE) 1000 MG tablet TAKE 1 TABLET BY MOUTH TWICE DAILY WITH A MEAL 180 tablet 0  . ONE TOUCH ULTRA TEST test strip USE AS DIRECTED TO CHECK BLOOD GLUCOSE TWICE DAILY 100 each 3  . ONETOUCH DELICA LANCETS MISC Check glucose twice daily 60 each 5  . OVER THE COUNTER MEDICATION B5 supplement, one a day    .  senna (SENOKOT) 8.6 MG TABS Take 2 tablets by mouth.      . sitaGLIPtin (JANUVIA) 100 MG tablet Take 1 tablet (100 mg total) by mouth daily. 30 tablet 11   No facility-administered medications prior to visit.    Allergies  Allergen Reactions  . Aspirin Other (See Comments)    Easy bruising    ROS As per HPI  PE: Blood pressure 128/79, pulse 104, temperature 98.7 F (37.1 C), temperature source Oral, resp. rate 16, height 5' 8.5" (1.74 m), weight 193 lb 8 oz (87.771 kg), SpO2 91 %. Gen: Alert, well appearing.  Patient is oriented to person, place, time, and situation. ENT: eyes clear, no erythem or swelling. Nose: clear Oral cavity/pharynx w/out lesion. SKIN: face, upper arms, upper legs, feet all essentially rash-free. Several toes on each foot have mild thickening, white substance under nails.  No signif sign of tinea pedis. Lower legs: extensive fine papular rash  with erythematous base.  Non pustules or vesicles.  Nontender. Forearms with similar-appearing, although less intense/extensive rash. Front, back, and sides of trunk (upper>lower) with deep pink, small plaque-like/palpable lesions that are non-blanching.  Nontender, no papules or pustules or vesicles.  No hives, no desquamation. Hands: rare pink palpable plaque on dorsal surface.  No fissuring lesions.  LABS:  none  IMPRESSION AND PLAN:  Dermatitis, unclear etiology (dermatitis herpetiformis?  Id rxn?). Prednisone '40mg'$  qd x 5d, '20mg'$  qd x 5d, then '10mg'$  qd x 6d, then stop. Allegra '180mg'$  qd, pepcid '10mg'$  qd, may take benadryl '25mg'$  po qhs prn, avoid topical meds for now.   Signs/symptoms to call or return for were reviewed and pt expressed understanding.  An After Visit Summary was printed and given to the patient.  FOLLOW UP: Return if symptoms worsen or fail to improve.

## 2015-07-11 NOTE — Progress Notes (Signed)
Pre visit review using our clinic review tool, if applicable. No additional management support is needed unless otherwise documented below in the visit note. 

## 2015-07-11 NOTE — Patient Instructions (Signed)
Buy otc generic allegra 180 mg and take one daily for itching. Buy otc generic pepcid 10mg  and take one tab once daily to help with itching.

## 2015-07-13 DIAGNOSIS — R000 Tachycardia, unspecified: Secondary | ICD-10-CM

## 2015-07-13 DIAGNOSIS — I452 Bifascicular block: Secondary | ICD-10-CM

## 2015-07-13 HISTORY — DX: Bifascicular block: I45.2

## 2015-07-13 HISTORY — DX: Tachycardia, unspecified: R00.0

## 2015-10-03 ENCOUNTER — Other Ambulatory Visit: Payer: Self-pay | Admitting: Endocrinology

## 2015-10-12 NOTE — Progress Notes (Signed)
Subjective:    Patient ID: Matthew Ramirez, male    DOB: [birth year], 75 y.o.   MRN: [id number]  HPI Pt returns for f/u of diabetes mellitus: DM type: 2 Dx'ed: 2440 Complications: none Therapy: 2 oral meds. DKA: never Severe hypoglycemia: never Pancreatitis: never Other: he has never been on insulin. Interval history:  pt states he feels well in general.  he says cbg's are approx 200.   He says [place] is $300/month.   Past Medical History  Diagnosis Date  . HTN (hypertension), benign   . Hearing impairment     Occupational noise damage x years.  Has hearing aids AU.  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  Marland Kitchen External hemorrhoids   . History of colon polyps 2009  . DM (diabetes mellitus) ([facility]) 05/2011  . Hyperlipidemia   . 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 Asthma     as child-no mdi use now  . Personal history of colonic adenomas 2009; 09/14/2012    Polypectomy 2010 and 2014.  Marland Kitchen Plantar fasciitis     deep tissue laser tx at Dr. Emily Filbert (chiro) helped A LOT!    Past Surgical History  Procedure Laterality Date  . Eye surgery      Right (s/p traumatic injury to eye)  . Circumcision  age 4  . Dental surgery      10+ root canals, several bridges  . Colonoscopy w/ polypectomy  2009; 2014    09/2012: tubular adenoma--no high grade dysplasia.  Repeat 09/2017 approx.  Diverticulosis and small int hem    Social History   Social History  . Marital Status: Married    Spouse Name: N/A  . Number of Children: N/A  . Years of Education: N/A   Occupational History  . Not on file.   Social History Main Topics  . Smoking status: Former Smoker -- 1.00 packs/day for 20 years    Types: Cigarettes    Quit date: 07/12/1968  . Smokeless tobacco: Never Used  . Alcohol Use: Yes     Comment: a couple of drinks a week  . Drug Use: No  . Sexual Activity: Not on file   Other Topics Concern  . Not on file   Social  History Narrative   Married, has 4 grown daughters, 15 grandchildren, and 1 great grandchild.   Occupation: Chief Financial Officer.  Originally from [facility], has lived in [place] since age 23 yrs.   Distant tobacco abuse: quit 1970.  Rare ETOH intake, no hx of ETOH abuse.  No drug use.   Walks about 4 miles per day.    Current Outpatient Prescriptions on File Prior to Visit  Medication Sig Dispense Refill  . B Complex-C (SUPER B COMPLEX PO) Take 2 tablets by mouth daily.    . Blood Glucose Monitoring Suppl (ONE TOUCH ULTRA SYSTEM KIT) W/DEVICE KIT 1 kit by Does not apply route once. 100 each 3  . Cholecalciferol (VITAMIN D) 400 UNITS capsule Take 400 Units by mouth daily.    Marland Kitchen ibuprofen (ADVIL,MOTRIN) 200 MG tablet Take 400 mg by mouth every 3 (three) hours as needed.    Marland Kitchen lisinopril (PRINIVIL,ZESTRIL) 5 MG tablet TAKE 1 TABLET BY MOUTH EVERY DAY 90 tablet 3  . magnesium 30 MG tablet Take 30 mg by mouth daily.    . metFORMIN (GLUCOPHAGE) 1000 MG tablet TAKE 1 TABLET BY MOUTH TWICE DAILY WITH A MEAL 180 tablet 0  . ONE  TOUCH ULTRA TEST test strip USE AS DIRECTED TO CHECK BLOOD GLUCOSE TWICE DAILY 100 each 3  . ONETOUCH DELICA LANCETS MISC Check glucose twice daily 60 each 5  . OVER THE COUNTER MEDICATION B5 supplement, one a day    . senna (SENOKOT) 8.6 MG TABS Take 2 tablets by mouth.      . sitaGLIPtin (JANUVIA) 100 MG tablet Take 1 tablet (100 mg total) by mouth daily. 30 tablet 11  . GARLIC PO Take 1 tablet by mouth daily. Reported on 10/13/2015    . predniSONE (DELTASONE) 20 MG tablet 2 tabs po qd x 5d, then 1 tab po qd x 5d, then 1/2 tab po qd x 6d, then stop (Patient not taking: Reported on 10/13/2015) 18 tablet 0   No current facility-administered medications on file prior to visit.    Allergies  Allergen Reactions  . Aspirin Other (See Comments)    Easy bruising    Family History  Problem Relation Age of Onset  . Cancer Mother     leukemia  . Cancer Sister      ovarian  . Colon cancer Neg Hx   . Rectal cancer Neg Hx   . Stomach cancer Neg Hx   . Emphysema Father     smoker  . Diabetes Mother   . Diabetes Sister     BP 128/87 mmHg  Pulse 64  Temp(Src) 97.9 F (36.6 C) (Oral)  Ht 5' 8.5" (1.74 m)  Wt 193 lb (87.544 kg)  BMI 28.92 kg/m2  SpO2 93%  Review of Systems He denies hypoglycemia.     Objective:   Physical Exam VITAL SIGNS:  See vs page GENERAL: no distress Pulses: dorsalis pedis intact bilat.   MSK: no deformity of the feet CV: no leg edema Skin:  no ulcer on the feet.  normal color and temp on the feet. Neuro: sensation is intact to touch on the feet Ext: There is bilateral onychomycosis of the toenails   Lab Results  Component Value Date   HGBA1C 7.0 10/13/2015      Assessment & Plan:  DM: worse Edema: resolved.  As he is preferring generic rx, we'll try adding pioglitizone.    Patient is advised the following: Patient Instructions  i have sent a prescription to your pharmacy, to add "pioglitizone." check your blood sugar 2-3 times per week.  vary the time of day when you check, between before the 3 meals, and at bedtime.  also check if you have symptoms of your blood sugar being too high or too low.  please keep a record of the readings and bring it to your next appointment here (or you can bring the meter itself).  You can write it on any piece of paper.  please call us sooner if your blood sugar goes below 70, or if you have a lot of readings over 200.  Please come back for a follow-up appointment in 3 months.  When you come back, we can change the januvia to a cheaper medication such as "nateglinide."

## 2015-10-12 NOTE — Patient Instructions (Addendum)
i have sent a prescription to your pharmacy, to add "pioglitizone." check your blood sugar 2-3 times per week.  vary the time of day when you check, between before the 3 meals, and at bedtime.  also check if you have symptoms of your blood sugar being too high or too low.  please keep a record of the readings and bring it to your next appointment here (or you can bring the meter itself).  You can write it on any piece of paper.  please call us sooner if your blood sugar goes below 70, or if you have a lot of readings over 200.  Please come back for a follow-up appointment in 3 months.  When you come back, we can change the januvia to a cheaper medication such as "nateglinide."

## 2015-10-13 ENCOUNTER — Ambulatory Visit (INDEPENDENT_AMBULATORY_CARE_PROVIDER_SITE_OTHER): Payer: Medicare HMO | Admitting: Endocrinology

## 2015-10-13 ENCOUNTER — Encounter: Payer: Self-pay | Admitting: Endocrinology

## 2015-10-13 VITALS — BP 128/87 | HR 64 | Temp 97.9°F | Ht 68.5 in | Wt 193.0 lb

## 2015-10-13 DIAGNOSIS — E119 Type 2 diabetes mellitus without complications: Secondary | ICD-10-CM

## 2015-10-13 LAB — POCT GLYCOSYLATED HEMOGLOBIN (HGB A1C): HEMOGLOBIN A1C: 7

## 2015-10-13 MED ORDER — PIOGLITAZONE HCL 45 MG PO TABS: 45.0000 mg | ORAL_TABLET | Freq: Every day | ORAL | Status: DC

## 2015-10-20 ENCOUNTER — Other Ambulatory Visit: Payer: Self-pay | Admitting: Family Medicine

## 2015-10-20 DIAGNOSIS — R69 Illness, unspecified: Secondary | ICD-10-CM | POA: Diagnosis not present

## 2015-11-10 DIAGNOSIS — Q446 Cystic disease of liver: Secondary | ICD-10-CM

## 2015-11-10 HISTORY — DX: Cystic disease of liver: Q44.6

## 2015-11-10 HISTORY — PX: OTHER SURGICAL HISTORY: SHX169

## 2015-11-13 ENCOUNTER — Encounter: Payer: Self-pay | Admitting: Family Medicine

## 2015-11-13 ENCOUNTER — Ambulatory Visit (INDEPENDENT_AMBULATORY_CARE_PROVIDER_SITE_OTHER): Payer: Medicare HMO | Admitting: Family Medicine

## 2015-11-13 VITALS — BP 123/76 | HR 84 | Temp 98.5°F | Resp 16 | Ht 68.5 in | Wt 191.8 lb

## 2015-11-13 DIAGNOSIS — R002 Palpitations: Secondary | ICD-10-CM | POA: Diagnosis not present

## 2015-11-13 DIAGNOSIS — R9431 Abnormal electrocardiogram [ECG] [EKG]: Secondary | ICD-10-CM | POA: Diagnosis not present

## 2015-11-13 DIAGNOSIS — I4510 Unspecified right bundle-branch block: Secondary | ICD-10-CM

## 2015-11-13 DIAGNOSIS — I452 Bifascicular block: Secondary | ICD-10-CM

## 2015-11-13 LAB — BASIC METABOLIC PANEL
BUN: 17 mg/dL (ref 7–25)
CO2: 25 mmol/L (ref 20–31)
Calcium: 9.3 mg/dL (ref 8.6–10.3)
Chloride: 102 mmol/L (ref 98–110)
Creat: 0.83 mg/dL (ref 0.70–1.18)
GLUCOSE: 129 mg/dL — AB (ref 65–99)
POTASSIUM: 4.1 mmol/L (ref 3.5–5.3)
Sodium: 138 mmol/L (ref 135–146)

## 2015-11-13 LAB — MAGNESIUM: MAGNESIUM: 1.9 mg/dL (ref 1.5–2.5)

## 2015-11-13 NOTE — Progress Notes (Signed)
Pre visit review using our clinic review tool, if applicable. No additional management support is needed unless otherwise documented below in the visit note. 

## 2015-11-13 NOTE — Progress Notes (Signed)
OFFICE VISIT  11/13/2015   CC:  Chief Complaint  Patient presents with  . Fatigue    2 episodes of fatigue/weakness and elevated HR   HPI:    Patient is a 75 y.o. Caucasian male with a history of well controlled DM 2 and HTN who presents accompanied by his wife for 2 episodes of sudden onset fatigue and vague dizziness--lasted 30-45 min.  On one occasion he checked his glucose DURING eating a sandwich and it was 139.  The most recent episode was 2 d/a.  Pulse on one occasion was 197 when he used his bp cuff.  No CP, no real sensation of heart racing, no SOB.  Patient cannot really verbalize distinctly how he felt other than the fact that he suddenly became extremely "washed out".  His family members are very concerned that he may have atrial fibrillation.  Pt denies any hypoglycemia recently.  No LE swelling. He remembers feeling like this once before a couple of years ago but then it never recurred. He does suffer from chronic fatigue that his wife says started after he got on bp medication.  Past Medical History  Diagnosis Date  . HTN (hypertension), benign   . Hearing impairment     Occupational noise damage x years.  Has hearing aids AU.  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  Marland Kitchen External hemorrhoids   . History of colon polyps 2009  . DM (diabetes mellitus) ([place]) 05/2011  . Hyperlipidemia   . 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 Asthma     as child-no mdi use now  . Personal history of colonic adenomas 2009; 09/14/2012    Polypectomy 2010 and 2014.  Marland Kitchen Plantar fasciitis     deep tissue laser tx at Dr. Emily Filbert (chiro) helped A LOT!    Past Surgical History  Procedure Laterality Date  . Eye surgery      Right (s/p traumatic injury to eye)  . Circumcision  age 30  . Dental surgery      10+ root canals, several bridges  . Colonoscopy w/ polypectomy  2009; 2014    09/2012: tubular adenoma--no high grade dysplasia.  Repeat  09/2017 approx.  Diverticulosis and small int hem  . Transthoracic echocardiogram  07/16/08    EF 55-60%, mild LVH and RVH, impaired relaxation.   Pt not taking prednisone listed below Outpatient Prescriptions Prior to Visit  Medication Sig Dispense Refill  . B Complex-C (SUPER B COMPLEX PO) Take 2 tablets by mouth daily.    . Blood Glucose Monitoring Suppl (ONE TOUCH ULTRA SYSTEM KIT) W/DEVICE KIT 1 kit by Does not apply route once. 100 each 3  . GARLIC PO Take 1 tablet by mouth daily. Reported on 10/13/2015    . ibuprofen (ADVIL,MOTRIN) 200 MG tablet Take 400 mg by mouth every 3 (three) hours as needed.    Marland Kitchen lisinopril (PRINIVIL,ZESTRIL) 5 MG tablet TAKE 1 TABLET BY MOUTH EVERY DAY 90 tablet 3  . magnesium 30 MG tablet Take 30 mg by mouth daily.    . metFORMIN (GLUCOPHAGE) 1000 MG tablet TAKE 1 TABLET BY MOUTH TWICE DAILY WITH A MEAL 180 tablet 0  . ONE TOUCH ULTRA TEST test strip USE AS DIRECTED TO TEST BLOOD GLUCE TWICE DAILY 100 each 11  . ONETOUCH DELICA LANCETS MISC Check glucose twice daily 60 each 5  . OVER THE COUNTER MEDICATION B5 supplement, one a day    . pioglitazone (ACTOS) 45  MG tablet Take 1 tablet (45 mg total) by mouth daily. 90 tablet 3  . senna (SENOKOT) 8.6 MG TABS Take 2 tablets by mouth.      . sitaGLIPtin (JANUVIA) 100 MG tablet Take 1 tablet (100 mg total) by mouth daily. 30 tablet 11  . Cholecalciferol (VITAMIN D) 400 UNITS capsule Take 400 Units by mouth daily. Reported on 11/13/2015    . predniSONE (DELTASONE) 20 MG tablet 2 tabs po qd x 5d, then 1 tab po qd x 5d, then 1/2 tab po qd x 6d, then stop (Patient not taking: Reported on 10/13/2015) 18 tablet 0   No facility-administered medications prior to visit.    Allergies  Allergen Reactions  . Aspirin Other (See Comments)    Easy bruising    ROS As per HPI  PE: Blood pressure 123/76, pulse 84, temperature 98.5 F (36.9 C), temperature source Oral, resp. rate 16, height 5' 8.5" (1.74 m), weight 191 lb 12 oz  (86.977 kg), SpO2 92 %. Gen: Alert, well appearing.  Patient is oriented to person, place, time, and situation. AFFECT: pleasant, lucid thought and speech. Neck: supple/nontender.  No LAD, mass, or TM.  Carotid pulses 2+ bilaterally, without bruits. CV: RRR, no m/r/g Chest is clear, no wheezing or rales. Normal symmetric air entry throughout both lung fields. No chest wall deformities or tenderness. EXT: no clubbing, cyanosis, or edema.    LABS:  12 lead EKG: NSR, rate 77, complete RBBB, left axis bifascicular block.  No ectopy.  Compared to prior EKG 07/04/08 his RBBB is now complete and he formerly only had anterior fascicular block.    IMPRESSION AND PLAN:  Acute episodes of fatigue, elevated HR captured on one occasion. Pt now feeling fine and has normal exam. EKG a little bit more abnormal compared to 2009 (from a RBBB and fascicular block standpoint). Will check electrolytes.  He has a recent normal TSH. Will arrange 48H holter and will order referral to cardiac electrophysiology. No med changes today.  An After Visit Summary was printed and given to the patient.  FOLLOW UP: Return for f/u to be determined based on results of w/u.  Signed:  [name], MD           11/13/2015

## 2015-11-14 ENCOUNTER — Telehealth: Payer: Self-pay

## 2015-11-14 NOTE — Telephone Encounter (Signed)
-----   Message from Tammi Sou, MD sent at 11/14/2015 12:05 PM EDT ----- Pls notify pt that all labs yesterday were normal.-thx

## 2015-11-14 NOTE — Telephone Encounter (Signed)
Called patient. Gave lab results to spouse. Spouse verbalized understanding.

## 2015-11-17 DIAGNOSIS — H2512 Age-related nuclear cataract, left eye: Secondary | ICD-10-CM | POA: Diagnosis not present

## 2015-11-17 DIAGNOSIS — E119 Type 2 diabetes mellitus without complications: Secondary | ICD-10-CM | POA: Diagnosis not present

## 2015-11-17 LAB — HM DIABETES EYE EXAM

## 2015-11-18 ENCOUNTER — Ambulatory Visit (INDEPENDENT_AMBULATORY_CARE_PROVIDER_SITE_OTHER): Payer: Medicare HMO | Admitting: Cardiology

## 2015-11-18 ENCOUNTER — Encounter: Payer: Self-pay | Admitting: Cardiology

## 2015-11-18 ENCOUNTER — Other Ambulatory Visit: Payer: Self-pay | Admitting: Cardiology

## 2015-11-18 VITALS — BP 144/77 | HR 76 | Ht 70.0 in | Wt 195.2 lb

## 2015-11-18 DIAGNOSIS — I119 Hypertensive heart disease without heart failure: Secondary | ICD-10-CM

## 2015-11-18 DIAGNOSIS — E119 Type 2 diabetes mellitus without complications: Secondary | ICD-10-CM

## 2015-11-18 DIAGNOSIS — R9431 Abnormal electrocardiogram [ECG] [EKG]: Secondary | ICD-10-CM

## 2015-11-18 DIAGNOSIS — R002 Palpitations: Secondary | ICD-10-CM | POA: Diagnosis not present

## 2015-11-18 DIAGNOSIS — E785 Hyperlipidemia, unspecified: Secondary | ICD-10-CM | POA: Diagnosis not present

## 2015-11-18 NOTE — Progress Notes (Signed)
Cardiology Office Note   Date:  11/18/2015   ID:  SARGON SCOUTEN, DOB [birth year], MRN [id number]  PCP:  Tammi Sou, MD  Cardiologist:   Maryanna Stuber Martinique, MD   Chief Complaint  Patient presents with  . New Patient (Initial Visit)    palpitations. had only 3 episodes  got weak. denies dizziness, chest pain. only fatique.      History of Present Illness: Matthew Ramirez is a 75 y.o. male who presents for evaluation of tachycardia. He has a history of HTN and DM type 2. Last Monday he was digging out a basement and became extremely fatigued. He took his BS and it was 136. BP was normal. His BP monitor read a pulse rate 197. He repeated and it was "high" and later 125 bpm. He was not aware of any palpitations. No dizziness or syncope. No SOB. He had no recurrent symptoms afterwards. 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 He does have a history of excessive somnolence. Prior sleep study was negative. He did have an Echo in 2010 that showed Mild LVH with normal systolic function. Mild AV sclerosis, Mild MR,TR and SAM. There was an incidental finding of a 10-12 cm hepatic cyst. This was never evaluated further. There is a notation of Adenosine myoview but no report so I am not sure this was ever done.  Recent Ecg showed a new RBBB compared to 2009.    Past Medical History  Diagnosis Date  . HTN (hypertension), benign   . Hearing impairment     Occupational noise damage x years.  Has hearing aids AU.  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  Marland Kitchen External hemorrhoids   . History of colon polyps 2009  . DM (diabetes mellitus) ([place]) 05/2011  . Hyperlipidemia   . 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 Asthma     as child-no mdi use now  . Personal history of colonic adenomas 2009; 09/14/2012    Polypectomy 2010 and 2014.  Marland Kitchen Plantar fasciitis     deep tissue laser tx at Dr. Emily Filbert (chiro) helped A LOT!    Past Surgical  History  Procedure Laterality Date  . Eye surgery      Right (s/p traumatic injury to eye)  . Circumcision  age 41  . Dental surgery      10+ root canals, several bridges  . Colonoscopy w/ polypectomy  2009; 2014    09/2012: tubular adenoma--no high grade dysplasia.  Repeat 09/2017 approx.  Diverticulosis and small int hem  . Transthoracic echocardiogram  07/16/08    EF 55-60%, mild LVH and RVH, impaired relaxation.     Current Outpatient Prescriptions  Medication Sig Dispense Refill  . B Complex-C (SUPER B COMPLEX PO) Take 2 tablets by mouth daily.    . Blood Glucose Monitoring Suppl (ONE TOUCH ULTRA SYSTEM KIT) W/DEVICE KIT 1 kit by Does not apply route once. 100 each 3  . GARLIC PO Take 1 tablet by mouth daily. Reported on 10/13/2015    . ibuprofen (ADVIL,MOTRIN) 200 MG tablet Take 400 mg by mouth every 3 (three) hours as needed.    Marland Kitchen lisinopril (PRINIVIL,ZESTRIL) 5 MG tablet TAKE 1 TABLET BY MOUTH EVERY DAY 90 tablet 3  . magnesium 30 MG tablet Take 30 mg by mouth daily.    . metFORMIN (GLUCOPHAGE) 1000 MG tablet TAKE 1 TABLET BY MOUTH TWICE DAILY WITH A MEAL 180 tablet 0  .  ONE TOUCH ULTRA TEST test strip USE AS DIRECTED TO TEST BLOOD GLUCE TWICE DAILY 100 each 11  . ONETOUCH DELICA LANCETS MISC Check glucose twice daily 60 each 5  . OVER THE COUNTER MEDICATION B5 supplement, one a day    . pioglitazone (ACTOS) 45 MG tablet Take 1 tablet (45 mg total) by mouth daily. 90 tablet 3  . senna (SENOKOT) 8.6 MG TABS Take 2 tablets by mouth.      . sitaGLIPtin (JANUVIA) 100 MG tablet Take 1 tablet (100 mg total) by mouth daily. 30 tablet 11   No current facility-administered medications for this visit.    Allergies:   Aspirin    Social History:  The patient  reports that he quit smoking about 47 years ago. His smoking use included Cigarettes. He has a 20 pack-year smoking history. He has never used smokeless tobacco. He reports that he drinks alcohol. He reports that he does not use  illicit drugs.   Family History:  The patient's family history includes Cancer in his mother and sister; Diabetes in his mother and sister; Emphysema in his father. There is no history of Colon cancer, Rectal cancer, or Stomach cancer.    ROS:  Please see the history of present illness.   Otherwise, review of systems are positive for none.   All other systems are reviewed and negative.    PHYSICAL EXAM: VS:  BP 144/77 mmHg  Pulse 76  Ht _0  (1.778 m)  Wt 88.542 kg (195 lb 3.2 oz)  BMI 28.01 kg/m2 , BMI Body mass index is 28.01 kg/(m^2). GEN: Well nourished, well developed, in no acute distress HEENT: normal Neck: no JVD, carotid bruits, or masses Cardiac: RRR; no murmurs, rubs, or gallops,no edema  Respiratory:  clear to auscultation bilaterally, normal work of breathing GI: soft, nontender, nondistended, + BS MS: no deformity or atrophy Skin: warm and dry, no rash Neuro:  Strength and sensation are intact Psych: euthymic mood, full affect   EKG:  EKG is not ordered today. The ekg ordered  11/13/15 demonstrates NSR with LAFB and RBBB  Lab Results  Component Value Date   WBC 7.5 09/23/2014   HGB 14.8 09/23/2014   HCT 44.0 09/23/2014   PLT 187.0 09/23/2014   GLUCOSE 129* 11/13/2015   CHOL 186 05/27/2015   TRIG 202* 05/27/2015   HDL 41 05/27/2015   LDLCALC 105 05/27/2015   ALT 25 09/23/2014   AST 18 09/23/2014   NA 138 11/13/2015   K 4.1 11/13/2015   CL 102 11/13/2015   CREATININE 0.83 11/13/2015   BUN 17 11/13/2015   CO2 25 11/13/2015   TSH 2.45 09/23/2014   PSA 0.55 05/11/2011   HGBA1C 7.0 10/13/2015   MICROALBUR <0.7 01/23/2015      Recent Labs: 11/13/2015: BUN 17; Creat 0.83; Magnesium 1.9; Potassium 4.1; Sodium 138    Lipid Panel    Component Value Date/Time   CHOL 186 05/27/2015 0826   TRIG 202* 05/27/2015 0826   HDL 41 05/27/2015 0826   CHOLHDL 4.5 05/27/2015 0826   VLDL 40* 05/27/2015 0826   LDLCALC 105 05/27/2015 0826      Wt Readings from  Last 3 Encounters:  11/18/15 88.542 kg (195 lb 3.2 oz)  11/13/15 86.977 kg (191 lb 12 oz)  10/13/15 87.544 kg (193 lb)      Other studies Reviewed: Additional studies/ records that were reviewed today include: none. Review of the above records demonstrates: N/A   ASSESSMENT AND PLAN:  1.  Tachycardia. This was noted on a BP monitor so I am not sure how accurate this reading is but this was associated with marked fatigue. This may represent Atrial fibrillation or other tachycardia. Given sporadic nature of symptoms will arrange for an event monitor to assess. I think a Holter will not have an adequate yield. We will also update an Echocardiogram.  2. Bifascicular block with LAFB and RBBB. RBBB is new from 2009. Will await results of event monitor.  3. 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 Will update. BP conttrolled.  4. DM type 2  5. History of hepatic cyst noted incidentally on Echo in 2010. Will defer evaluation to primary care.    Current medicines are reviewed at length with the patient today.  The patient does not have concerns regarding medicines.  The following changes have been made:  no change  Labs/ tests ordered today include:  Event monitor Echocardiogram.   Disposition:   FU with Dr. Martinique  in 2 months  Signed, Tsuneo Faison Martinique, MD  11/18/2015 8:50 AM    [facility] [address] [place], [place], [address] Phone [phone], Fax [phone]

## 2015-11-18 NOTE — Patient Instructions (Signed)
We will have you wear an event monitor   We will schedule you for an Echocardiogram  I will follow up in 2 months.

## 2015-11-19 ENCOUNTER — Ambulatory Visit (HOSPITAL_COMMUNITY)
Admission: RE | Admit: 2015-11-19 | Discharge: 2015-11-19 | Disposition: A | Discharge status: Home / Self Care | Payer: Medicare HMO | Source: Ambulatory Visit | Attending: Cardiovascular Disease | Admitting: Cardiovascular Disease

## 2015-11-19 ENCOUNTER — Ambulatory Visit (INDEPENDENT_AMBULATORY_CARE_PROVIDER_SITE_OTHER): Payer: Medicare HMO

## 2015-11-19 DIAGNOSIS — I071 Rheumatic tricuspid insufficiency: Secondary | ICD-10-CM | POA: Insufficient documentation

## 2015-11-19 DIAGNOSIS — R002 Palpitations: Secondary | ICD-10-CM | POA: Diagnosis not present

## 2015-11-19 DIAGNOSIS — E785 Hyperlipidemia, unspecified: Secondary | ICD-10-CM | POA: Diagnosis not present

## 2015-11-19 DIAGNOSIS — R9431 Abnormal electrocardiogram [ECG] [EKG]: Secondary | ICD-10-CM | POA: Insufficient documentation

## 2015-11-19 DIAGNOSIS — E119 Type 2 diabetes mellitus without complications: Secondary | ICD-10-CM | POA: Insufficient documentation

## 2015-11-19 DIAGNOSIS — I358 Other nonrheumatic aortic valve disorders: Secondary | ICD-10-CM | POA: Diagnosis not present

## 2015-11-19 DIAGNOSIS — Z87891 Personal history of nicotine dependence: Secondary | ICD-10-CM | POA: Insufficient documentation

## 2015-11-19 DIAGNOSIS — I340 Nonrheumatic mitral (valve) insufficiency: Secondary | ICD-10-CM | POA: Insufficient documentation

## 2015-11-19 DIAGNOSIS — I119 Hypertensive heart disease without heart failure: Secondary | ICD-10-CM | POA: Insufficient documentation

## 2015-11-19 NOTE — Progress Notes (Unsigned)
Patient presents for placement of 30 day event monitor per Dr. Martinique.  Monitor placed on patient.

## 2015-11-20 DIAGNOSIS — R000 Tachycardia, unspecified: Secondary | ICD-10-CM | POA: Diagnosis not present

## 2015-11-24 ENCOUNTER — Other Ambulatory Visit: Payer: Self-pay | Admitting: Family Medicine

## 2015-11-24 ENCOUNTER — Encounter: Payer: Self-pay | Admitting: Family Medicine

## 2015-11-24 DIAGNOSIS — K7689 Other specified diseases of liver: Secondary | ICD-10-CM

## 2015-11-26 ENCOUNTER — Ambulatory Visit (INDEPENDENT_AMBULATORY_CARE_PROVIDER_SITE_OTHER): Payer: Medicare HMO | Admitting: Family Medicine

## 2015-11-26 ENCOUNTER — Encounter: Payer: Self-pay | Admitting: Family Medicine

## 2015-11-26 ENCOUNTER — Telehealth: Payer: Self-pay | Admitting: *Deleted

## 2015-11-26 VITALS — BP 148/76 | HR 60 | Temp 98.1°F | Resp 16 | Ht 68.5 in | Wt 191.5 lb

## 2015-11-26 DIAGNOSIS — Z136 Encounter for screening for cardiovascular disorders: Secondary | ICD-10-CM

## 2015-11-26 DIAGNOSIS — Z87891 Personal history of nicotine dependence: Secondary | ICD-10-CM

## 2015-11-26 DIAGNOSIS — Z0000 Encounter for general adult medical examination without abnormal findings: Secondary | ICD-10-CM

## 2015-11-26 NOTE — Telephone Encounter (Signed)
Oops. OK, I entered new order.

## 2015-11-26 NOTE — Telephone Encounter (Signed)
Matthew Ramirez with [facility] on 11/26/15 at 10:21am stated that they need a new order for Aorta U/S entered. She stated that the order that was entered is not for screening and if this is for a screening AAA u/s then they will need a new order. New order should be for U/S screening AAA. Please advise. Thanks.

## 2015-11-26 NOTE — Progress Notes (Signed)
The patient is here for annual Medicare wellness examination and management of other chronic and acute problems. Other problems discussed today: none   AWV DATA The risk factors are reflected in the social history.  The roster of all physicians providing medical care to patient is listed in the Snapshot section of the chart.  Activities of daily living:  The patient is 100% independent in all ADLs: dressing, toileting, feeding as well as independent mobility.  Home safety : The patient has smoke detectors in the home. They wear seatbelts. No firearms at home ( firearms are present in the home, kept in a safe fashion). There is no violence in the home.   There is no risks for hepatitis, STDs or HIV. There is no history of blood transfusion. They have no travel history to infectious disease endemic areas of the world.  The patient has seen their dentist in the last six month. They have seen their eye doctor in the last year. They deny any hearing difficulty b/c he has hearing aids currently.  They do not  have excessive sun exposure. Discussed the need for sun protection: hats, long sleeves and use of sunscreen if there is significant sun exposure.   Diet: the importance of a healthy diet is discussed. They do have a healthy diet.  The patient has a regular exercise program: walks 2.5 miles 5 days per week.  The benefits of regular aerobic exercise were discussed.  Depression screen: there are no signs or vegative symptoms of depression- irritability, change in appetite, anhedonia, sadness/tearfullness.  Cognitive assessment: the patient manages all their financial and personal affairs and is actively engaged. They could relate day,date,year and events; recalled 3/3 objects at 3 minutes; performed clock-face test normally.  Reviewed advance directives with pt today.  The following portions of the patient's history were reviewed and updated as appropriate: allergies, current medications, past  family history, past medical history,  past surgical history, past social history  and problem list.  Vision, hearing, body mass index were assessed and reviewed. Body mass index is 28.69 kg/(m^2).   During the course of the visit the patient was educated and counseled about appropriate screening and preventive services including :  Annual wellness visit --doing today. diabetes screening: N/A b/c he has DM 2. colorectal cancer screening: plan for repeat colonoscopy around 09/2017. recommended immunizations (influenza, pneumococcal, Hep B): pt declines. Bone mass measurement: N/A Counseling to prevent tobacco use: N/A Depression screening: done today--negative. Glaucoma screening: to be done at eye MD office. Hepatitis C virus screening: pt doesn't qualify. HIV virus screening: pt declines. Lung cancer screening: pt does not qualify. Medical nutrition therapy: pt declines. Prostate cancer screening: pt declines. Screening mammography: N/A Screening pap tests, pelvic exam, and clinical breast exam: N/A Ultrasound screening for AAA: pt qualifies and wants this test (male between age of 83 and 66 and he has smoked at least 100 cigarettes in his lifetime).  A written plan of action regarding the above screening and preventative services was given to the patient today.  Gave pt a packet for advance directives today and also ordered a screening aortic ultrasound.  An After Visit Summary was printed and given to the patient.  Signed:  [name], MD           11/26/2015

## 2015-11-26 NOTE — Progress Notes (Signed)
Pre visit review using our clinic review tool, if applicable. No additional management support is needed unless otherwise documented below in the visit note. 

## 2015-12-02 ENCOUNTER — Other Ambulatory Visit: Payer: Medicare HMO

## 2015-12-03 ENCOUNTER — Other Ambulatory Visit: Payer: Self-pay | Admitting: Family Medicine

## 2015-12-03 ENCOUNTER — Ambulatory Visit
Admission: RE | Admit: 2015-12-03 | Discharge: 2015-12-03 | Disposition: A | Discharge status: Home / Self Care | Payer: Medicare HMO | Source: Ambulatory Visit | Attending: Family Medicine | Admitting: Family Medicine

## 2015-12-03 ENCOUNTER — Telehealth: Payer: Self-pay | Admitting: *Deleted

## 2015-12-03 DIAGNOSIS — R160 Hepatomegaly, not elsewhere classified: Secondary | ICD-10-CM

## 2015-12-03 DIAGNOSIS — K7689 Other specified diseases of liver: Secondary | ICD-10-CM | POA: Diagnosis not present

## 2015-12-03 DIAGNOSIS — Z87891 Personal history of nicotine dependence: Secondary | ICD-10-CM

## 2015-12-03 DIAGNOSIS — Z136 Encounter for screening for cardiovascular disorders: Secondary | ICD-10-CM

## 2015-12-03 NOTE — Telephone Encounter (Signed)
[facility] on 12/03/15 at 4:37pm stating that they need the order for MRI changed to MRI Abd with and without contrast. Please advise. Thanks.

## 2015-12-05 ENCOUNTER — Ambulatory Visit
Admission: RE | Admit: 2015-12-05 | Discharge: 2015-12-05 | Disposition: A | Discharge status: Home / Self Care | Payer: Medicare HMO | Source: Ambulatory Visit | Attending: Family Medicine | Admitting: Family Medicine

## 2015-12-05 ENCOUNTER — Other Ambulatory Visit: Payer: Self-pay | Admitting: Family Medicine

## 2015-12-05 DIAGNOSIS — Z01818 Encounter for other preprocedural examination: Secondary | ICD-10-CM | POA: Diagnosis not present

## 2015-12-05 DIAGNOSIS — Z77018 Contact with and (suspected) exposure to other hazardous metals: Secondary | ICD-10-CM

## 2015-12-05 DIAGNOSIS — R160 Hepatomegaly, not elsewhere classified: Secondary | ICD-10-CM

## 2015-12-05 DIAGNOSIS — K7689 Other specified diseases of liver: Secondary | ICD-10-CM | POA: Diagnosis not present

## 2015-12-05 HISTORY — DX: Cystic disease of liver: Q44.6

## 2015-12-05 HISTORY — DX: Hepatomegaly, not elsewhere classified: R16.0

## 2015-12-05 HISTORY — DX: Polycystic kidney, unspecified: Q61.3

## 2015-12-05 MED ORDER — GADOBENATE DIMEGLUMINE 529 MG/ML IV SOLN
17.0000 mL | Freq: Once | INTRAVENOUS | Status: AC | PRN
  Administered 2015-12-05: 17 mL via INTRAVENOUS

## 2015-12-07 DIAGNOSIS — R69 Illness, unspecified: Secondary | ICD-10-CM | POA: Diagnosis not present

## 2015-12-09 ENCOUNTER — Encounter: Payer: Self-pay | Admitting: Family Medicine

## 2015-12-10 ENCOUNTER — Other Ambulatory Visit: Payer: Self-pay | Admitting: Family Medicine

## 2015-12-10 DIAGNOSIS — R160 Hepatomegaly, not elsewhere classified: Secondary | ICD-10-CM

## 2015-12-10 DIAGNOSIS — Q446 Cystic disease of liver: Secondary | ICD-10-CM

## 2015-12-11 ENCOUNTER — Telehealth: Payer: Self-pay | Admitting: Internal Medicine

## 2015-12-11 NOTE — Telephone Encounter (Signed)
6/1  Dr. Carlean Purl patient   Referral in Epic for Liver mass seen on MRI.  Advise on urgency

## 2015-12-11 NOTE — Telephone Encounter (Signed)
Dr. Carlean Purl will you please review the MRI from 12/05/15.  How quickly should he be seen ? I can put him in on 6/12 or I can put him in when you get back?  They recommended a 3 month MRI follow up

## 2015-12-12 NOTE — Telephone Encounter (Signed)
Assuming he is feeling ok 6/12 is fine If having pain let me know

## 2015-12-12 NOTE — Telephone Encounter (Signed)
Patient notified  He is not having pain  He will come in on 12/22/15 4:00

## 2015-12-22 ENCOUNTER — Ambulatory Visit: Payer: Medicare HMO | Admitting: Internal Medicine

## 2015-12-23 ENCOUNTER — Encounter: Payer: Self-pay | Admitting: Internal Medicine

## 2015-12-23 ENCOUNTER — Other Ambulatory Visit (INDEPENDENT_AMBULATORY_CARE_PROVIDER_SITE_OTHER): Payer: Medicare HMO

## 2015-12-23 ENCOUNTER — Ambulatory Visit (INDEPENDENT_AMBULATORY_CARE_PROVIDER_SITE_OTHER): Payer: Medicare HMO | Admitting: Internal Medicine

## 2015-12-23 VITALS — BP 126/70 | HR 76 | Ht 68.5 in | Wt 191.0 lb

## 2015-12-23 DIAGNOSIS — R160 Hepatomegaly, not elsewhere classified: Secondary | ICD-10-CM

## 2015-12-23 DIAGNOSIS — K648 Other hemorrhoids: Secondary | ICD-10-CM | POA: Diagnosis not present

## 2015-12-23 DIAGNOSIS — K7689 Other specified diseases of liver: Secondary | ICD-10-CM

## 2015-12-23 DIAGNOSIS — R978 Other abnormal tumor markers: Secondary | ICD-10-CM | POA: Diagnosis not present

## 2015-12-23 DIAGNOSIS — K769 Liver disease, unspecified: Secondary | ICD-10-CM | POA: Diagnosis not present

## 2015-12-23 DIAGNOSIS — Q6100 Congenital renal cyst, unspecified: Secondary | ICD-10-CM

## 2015-12-23 DIAGNOSIS — N281 Cyst of kidney, acquired: Secondary | ICD-10-CM

## 2015-12-23 LAB — HEPATIC FUNCTION PANEL
ALBUMIN: 4.4 g/dL (ref 3.5–5.2)
ALT: 16 U/L (ref 0–53)
AST: 17 U/L (ref 0–37)
Alkaline Phosphatase: 63 U/L (ref 39–117)
Bilirubin, Direct: 0.1 mg/dL (ref 0.0–0.3)
TOTAL PROTEIN: 7.4 g/dL (ref 6.0–8.3)
Total Bilirubin: 0.6 mg/dL (ref 0.2–1.2)

## 2015-12-23 MED ORDER — NYSTATIN-TRIAMCINOLONE 100000-0.1 UNIT/GM-% EX OINT: 1.0000 "application " | TOPICAL_OINTMENT | Freq: Two times a day (BID) | CUTANEOUS | Status: DC

## 2015-12-23 NOTE — Patient Instructions (Addendum)
  Normal BMI (Body Mass Index- based on height and weight) is between 23 and 30. Your BMI today is Body mass index is 28.62 kg/(m^2). Marland Kitchen Please consider follow up  regarding your BMI with your Primary Care Provider.  Your physician has requested that you go to the basement for the lab work before leaving today.   We have sent the following medications to your pharmacy for you to pick up at your convenience: mycolog   You have been scheduled for an abdominal ultrasound at [facility] (1st floor of hospital) on 03/04/16 at 9:00AM. Please arrive 15 minutes prior to your appointment for registration. Make certain not to have anything to eat or drink 6 hours prior to your appointment. Should you need to reschedule your appointment, please contact radiology at [phone]. This test typically takes about 30 minutes to perform.   I appreciate the opportunity to care for you. Silvano Rusk, MD, [facility]

## 2015-12-23 NOTE — Progress Notes (Signed)
   Subjective:    Patient ID: Matthew Ramirez, male    DOB: 1940/11/16, 75 y.o.   MRN: [id number] Cc: Liver mass on MR HPI Pleasant 75 yo man that had liver cysts detected on an Echocardiogram and then confirmed on an [place]. He had numerous cysts in liver and kidneys - but also had a mass like lesion in right lobe with suspected blood flow. Follow-up MRI showed a mass also but no blood flow suspected - ? If had bled at time of [place]. He does not have any pain. He does have some intermittent rectal bleeding from hemorrhoids - s/p banding in 2014 - much less sxic than before.  Medications, allergies, past medical history, past surgical history, family history and social history are reviewed and updated in the EMR.   Yolanda Bonine is radiology resident in [place], [place] Review of Systems Otherwise negative    Objective:   Physical Exam BP 126/70 mmHg  Pulse 76  Ht 5' 8.5" (1.74 m)  Wt 191 lb (86.637 kg)  BMI 28.62 kg/m2 NAD  [place] 12/03/15  1. 7.8 x 7.8 x 8.6 cm mass in the right lobe of the liver. This could be benign or malignant. Benign-appearing cysts are present within the liver as above.  2. Dilated common bile duct at 10 mm. Intrahepatic biliary ductal dilatation also appears present. No gallstones are noted.  MR 12/06/15  IMPRESSION: 1. Complex lesion in the right hepatic lobe corresponding to the lesion seen on ultrasound does not have any appreciable enhancement on subtraction images. I struggle to reconcile the current complete lack of enhancement in this lesion with the blood flow suggested on the prior ultrasound. The prior ultrasound was not a dedicated Doppler exam, and the technologist did not perform any Doppler waveform characterization of flow within this mass. It may be that the appearance of flow in the mass on the color Doppler images was caused by swirling of debris or is not actually real blood flow. I guess it is possible that potentially vascular portions or  bleeding of this lesion versus from 2 days ago have now completely infarcted or stopped bleeding, leaving no internal enhancement, but that seems far fetched. In any case, given the lack of enhancement, I feel that this lesion might be reasonably followed. I would suggest a 3 month follow up ultrasound exam to included Doppler waveform interrogation of the lesion for flow, if any. 2. Innumerable cystic lesions throughout the liver, with multiple fine septations probably from polycystic liver disease, less likely correlates disease or biliary hamartomas. 3. 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  I reviewed the MR images in person with the payient    Assessment & Plan:   Encounter Diagnoses  Name Primary?  . Liver mass, right lobe Yes  . Hepatic cyst   . Hemorrhoids, internal, with bleeding   . Kidney cysts     Plan for f/u US with dopplers in August LFT's and AFP drawn have returned NL Seems like this is benign but may be a cyst that had bleeding into it He appears to have polycystic liver and kidney disease but no kidney or liver dysfunction   25 minutes time spent with patient > half in counseling coordination of care  I appreciate the opportunity to care for this patient. [id number] H, MD

## 2015-12-23 NOTE — Telephone Encounter (Signed)
Done

## 2015-12-23 NOTE — Assessment & Plan Note (Signed)
Still bleeding some Wants to observe

## 2015-12-24 ENCOUNTER — Encounter: Payer: Self-pay | Admitting: Family Medicine

## 2015-12-24 DIAGNOSIS — N281 Cyst of kidney, acquired: Secondary | ICD-10-CM | POA: Insufficient documentation

## 2015-12-24 LAB — AFP TUMOR MARKER: AFP TUMOR MARKER: 1.3 ng/mL (ref <6.1)

## 2015-12-31 ENCOUNTER — Other Ambulatory Visit: Payer: Self-pay | Admitting: Endocrinology

## 2016-01-27 DIAGNOSIS — R69 Illness, unspecified: Secondary | ICD-10-CM | POA: Diagnosis not present

## 2016-02-12 NOTE — Progress Notes (Signed)
Cardiology Office Note   Date:  02/13/2016   ID:  Matthew Ramirez, DOB 01-Feb-1941, MRN [id number]  PCP:  Tammi Sou, MD  Cardiologist:   Nahdia Doucet [place], MD   No chief complaint on file.     History of Present Illness: Matthew Ramirez is a 75 y.o. male who is seen for follow up  of tachycardia. He has a history of HTN and DM type 2. He was seen in May with complaints of tachycardia after he was digging out a basement and became extremely fatigued. He took his BS and it was 136. BP was normal. His BP monitor read a pulse rate 197. He repeated and it was "high" and later 125 bpm. He was not aware of any palpitations. No dizziness or syncope. No SOB. He had no recurrent symptoms afterwards. 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 He does have a history of excessive somnolence. Prior sleep study was negative. He did have an Echo in 2010 that showed Mild LVH with normal systolic function. Mild AV sclerosis, Mild MR,TR and SAM. There was an incidental finding of a 10-12 cm hepatic cyst. This was never evaluated further. There is a notation of Adenosine myoview but no report so I am not sure this was ever done.  Recent Ecg showed a new RBBB compared to 2009. He had an Echo which showed moderate LVH with normal systolic function. Diastolic dysfunction. Valves OK. Multiple hepatic cysts.  Event monitor was done and showed no arrhythmias.  On follow up today he feels great. No recurrent tachycardia. No chest pain or SOB.     Past Medical History:  Diagnosis Date  . Asthma    as child-no mdi use now  . 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 DM (diabetes mellitus) ([place]) 05/2011  . 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 External hemorrhoids   . Hearing impairment    Occupational noise damage x years.  Has hearing aids AU.  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  Marland Kitchen History of colon polyps 2010  . HTN (hypertension), benign   .  Hyperlipidemia   . Liver mass 11/2015   MRI abd equivocal regarding benign/malig--recommended f/u liver u/s with doppler in 3 mo (u/s ordered by Dr. Carlean Purl already)  . Personal history of colonic adenomas 2009; 09/14/2012   Polypectomy 2010 and 2014.  Marland Kitchen Plantar fasciitis    deep tissue laser tx at Dr. Emily Filbert (chiro) helped A LOT!  Marland Kitchen Polycystic kidney disease    Normal renal function as of 11/2015    Past Surgical History:  Procedure Laterality Date  . CIRCUMCISION  age 13  . COLONOSCOPY W/ POLYPECTOMY  2009; 2014   09/2012: tubular adenoma--no high grade dysplasia.  Repeat 09/2017 approx.  Diverticulosis and small int hem  . DENTAL SURGERY     10+ root canals, several bridges  . EYE SURGERY     Right (s/p traumatic injury to eye)  . TRANSTHORACIC ECHOCARDIOGRAM  07/16/08; 11/2015   EF 55-60%, mild LVH and RVH, impaired relaxation.  2017 moderate LVH, EF 65-70%, grade I DD.  Marland Kitchen US AORTA: SCREENING  11/2015   No aneurism but "ectatic" portion of aorta was noted; repeat u/s in 5 yrs recommended     Current Outpatient Prescriptions  Medication Sig Dispense Refill  . B Complex-C (SUPER B COMPLEX PO) Take 2 tablets by mouth daily.    . Blood Glucose Monitoring Suppl (ONE TOUCH ULTRA  SYSTEM KIT) W/DEVICE KIT 1 kit by Does not apply route once. 100 each 3  . ibuprofen (ADVIL,MOTRIN) 200 MG tablet Take 400 mg by mouth every 3 (three) hours as needed.    Marland Kitchen lisinopril (PRINIVIL,ZESTRIL) 5 MG tablet TAKE 1 TABLET BY MOUTH EVERY DAY 90 tablet 3  . magnesium 30 MG tablet Take 30 mg by mouth daily.    . metFORMIN (GLUCOPHAGE) 1000 MG tablet TAKE 1 TABLET BY MOUTH TWICE DAILY WITH A MEAL 180 tablet 0  . nystatin-triamcinolone ointment (MYCOLOG) Apply 1 application topically 2 (two) times daily. 30 g 0  . ONE TOUCH ULTRA TEST test strip USE AS DIRECTED TO TEST BLOOD GLUCE TWICE DAILY 100 each 11  . ONETOUCH DELICA LANCETS MISC Check glucose twice daily 60 each 5  . OVER THE COUNTER MEDICATION B5  supplement, one a day    . pioglitazone (ACTOS) 45 MG tablet Take 1 tablet (45 mg total) by mouth daily. 90 tablet 3  . senna (SENOKOT) 8.6 MG TABS Take 2 tablets by mouth.      . sitaGLIPtin (JANUVIA) 100 MG tablet Take 1 tablet (100 mg total) by mouth daily. 30 tablet 11   No current facility-administered medications for this visit.     Allergies:   Review of patient's allergies indicates no active allergies.    Social History:  The patient  reports that he quit smoking about 47 years ago. His smoking use included Cigarettes. He has a 20.00 pack-year smoking history. He has never used smokeless tobacco. He reports that he drinks alcohol. He reports that he does not use drugs.   Family History:  The patient's family history includes Cancer in his mother and sister; Diabetes in his mother and sister; Emphysema in his father.    ROS:  Please see the history of present illness.   Otherwise, review of systems are positive for none.   All other systems are reviewed and negative.    PHYSICAL EXAM: VS:  BP 118/76   Pulse 66   Ht '5\' 10"'$  (1.778 m)   Wt 197 lb 12.8 oz (89.7 kg)   BMI 28.38 kg/m  , BMI Body mass index is 28.38 kg/m. GEN: Well nourished, well developed, in no acute distress  HEENT: normal  Neck: no JVD, carotid bruits, or masses Cardiac: RRR; no murmurs, rubs, or gallops,no edema  Respiratory:  clear to auscultation bilaterally, normal work of breathing GI: soft, nontender, nondistended, + BS MS: no deformity or atrophy  Skin: warm and dry, no rash Neuro:  Strength and sensation are intact Psych: euthymic mood, full affect   EKG:  EKG is not ordered today.   Lab Results  Component Value Date   WBC 7.5 09/23/2014   HGB 14.8 09/23/2014   HCT 44.0 09/23/2014   PLT 187.0 09/23/2014   GLUCOSE 129 (H) 11/13/2015   CHOL 186 05/27/2015   TRIG 202 (H) 05/27/2015   HDL 41 05/27/2015   LDLCALC 105 05/27/2015   ALT 16 12/23/2015   AST 17 12/23/2015   NA 138 11/13/2015    K 4.1 11/13/2015   CL 102 11/13/2015   CREATININE 0.83 11/13/2015   BUN 17 11/13/2015   CO2 25 11/13/2015   TSH 2.45 09/23/2014   PSA 0.55 05/11/2011   HGBA1C 7.0 10/13/2015   MICROALBUR <0.7 01/23/2015      Recent Labs: 11/13/2015: BUN 17; Creat 0.83; Magnesium 1.9; Potassium 4.1; Sodium 138 12/23/2015: ALT 16    Lipid Panel  Component Value Date/Time   CHOL 186 05/27/2015 0826   TRIG 202 (H) 05/27/2015 0826   HDL 41 05/27/2015 0826   CHOLHDL 4.5 05/27/2015 0826   VLDL 40 (H) 05/27/2015 0826   LDLCALC 105 05/27/2015 0826      Wt Readings from Last 3 Encounters:  02/13/16 197 lb 12.8 oz (89.7 kg)  12/23/15 191 lb (86.6 kg)  11/26/15 191 lb 8 oz (86.9 kg)    Event monitor; no arrhythmia.  Echo: 11/19/15:Study Conclusions  - Left ventricle: The cavity size was normal. Wall thickness was   increased in a pattern of moderate LVH. Systolic function was   vigorous. The estimated ejection fraction was in the range of 65%   to 70%. Wall motion was normal; there were no regional wall   motion abnormalities. Doppler parameters are consistent with   abnormal left ventricular relaxation (grade 1 diastolic   dysfunction). The E/e&' ratio is between 8-15, suggesting   indeterminate LV filling pressure. - Aortic valve: Sclerosis without stenosis. There was no   regurgitation. - Mitral valve: Mildly thickened leaflets . There was trivial   regurgitation. - Left atrium: The atrium was normal in size. - Tricuspid valve: There was trivial regurgitation. - Pulmonary arteries: PA peak pressure: 23 mm Hg (S). - Inferior vena cava: The vessel was normal in size. The   respirophasic diameter changes were in the normal range (>= 50%),   consistent with normal central venous pressure. - Pericardium, extracardiac: Multiple hypoechoic round structures   in the liver, suspicious for cysts measuring from 1-2 cm up to 8   cm in diameter. Consider dedicated abdominal ultrasound if not    already known.  Impressions:  - LVEF 65-70%, moderate LVH, normal wall motion, diastolic   dysfunction, indeterminate LV filling pressure, normal LA size,   trivial TR, normal RVSP, normal IVC, multiple hypoechoic   structures in the liver measuring 1-2 cm up to 8 cm in diameter,   likely cysts. Consider dedicated abdominal ultrasound to evaluate   further.  Other studies Reviewed: Additional studies/ records that were reviewed today include: none. Review of the above records demonstrates: N/A   ASSESSMENT AND PLAN:  1.  Tachycardia. No recurrence. Event monitor and Echo unremarkable. No further evaluation needed.  2. Bifascicular block with LAFB and RBBB. RBBB is new from 2009.  No pauses or bradycardia.  3. 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 BP is well controlled.  4. DM type 2  5. History of hepatic cyst noted incidentally on Echo in 2010. Followed by primary care.    Current medicines are reviewed at length with the patient today.  The patient does not have concerns regarding medicines.  The following changes have been made:  no change  Labs/ tests ordered today include:  none   Disposition:   FU as needed  Signed, Zackeriah Kissler [place], MD  02/13/2016 9:16 AM    [facility] [address] [place], [place], [address] Phone [phone], Fax [phone]

## 2016-02-13 ENCOUNTER — Encounter: Payer: Self-pay | Admitting: Cardiology

## 2016-02-13 ENCOUNTER — Ambulatory Visit (INDEPENDENT_AMBULATORY_CARE_PROVIDER_SITE_OTHER): Payer: Medicare HMO | Admitting: Cardiology

## 2016-02-13 VITALS — BP 118/76 | HR 66 | Ht 70.0 in | Wt 197.8 lb

## 2016-02-13 DIAGNOSIS — E785 Hyperlipidemia, unspecified: Secondary | ICD-10-CM

## 2016-02-13 DIAGNOSIS — I10 Essential (primary) hypertension: Secondary | ICD-10-CM

## 2016-02-13 DIAGNOSIS — E119 Type 2 diabetes mellitus without complications: Secondary | ICD-10-CM

## 2016-02-13 DIAGNOSIS — I119 Hypertensive heart disease without heart failure: Secondary | ICD-10-CM

## 2016-02-13 NOTE — Patient Instructions (Addendum)
Continue your current therapy  I will see you back as needed.   

## 2016-02-26 ENCOUNTER — Encounter: Payer: Self-pay | Admitting: Family Medicine

## 2016-03-02 ENCOUNTER — Other Ambulatory Visit: Payer: Self-pay | Admitting: Internal Medicine

## 2016-03-02 DIAGNOSIS — K7689 Other specified diseases of liver: Secondary | ICD-10-CM

## 2016-03-02 DIAGNOSIS — R160 Hepatomegaly, not elsewhere classified: Secondary | ICD-10-CM

## 2016-03-03 ENCOUNTER — Telehealth: Payer: Self-pay | Admitting: Internal Medicine

## 2016-03-03 NOTE — Telephone Encounter (Signed)
Patient notified of appt details and instructions.

## 2016-03-03 NOTE — Telephone Encounter (Signed)
Patient needs to be NPO for 6 hours.  This was scheduled at the office visit in June.   Left message for patient to call back

## 2016-03-04 ENCOUNTER — Ambulatory Visit (HOSPITAL_COMMUNITY)
Admission: RE | Admit: 2016-03-04 | Discharge: 2016-03-04 | Disposition: A | Discharge status: Home / Self Care | Payer: Medicare HMO | Source: Ambulatory Visit | Attending: Internal Medicine | Admitting: Internal Medicine

## 2016-03-04 DIAGNOSIS — R160 Hepatomegaly, not elsewhere classified: Secondary | ICD-10-CM | POA: Diagnosis not present

## 2016-03-04 DIAGNOSIS — K7689 Other specified diseases of liver: Secondary | ICD-10-CM | POA: Diagnosis not present

## 2016-03-08 NOTE — Progress Notes (Signed)
Liver lesion is stable in size - good news but radiologist still not certain what it is so they recommend repeating an MR abdomen as specified below - in 6 months  Since it is not growing cancer seems unlikely but want to recheck it at least one more time to see   Order MRI this way please:  Follow-up MRI of the abdomen is recommended in 6 months. Recommend that this be performed with Eovist contrast on follow-up to delineate differences between an Eovist study versus the initial gadolinium enhanced study.

## 2016-03-16 DIAGNOSIS — R69 Illness, unspecified: Secondary | ICD-10-CM | POA: Diagnosis not present

## 2016-03-22 ENCOUNTER — Other Ambulatory Visit: Payer: Self-pay | Admitting: Endocrinology

## 2016-03-22 NOTE — Telephone Encounter (Signed)
Please refill x 1 Ov is due  

## 2016-03-27 ENCOUNTER — Other Ambulatory Visit: Payer: Self-pay | Admitting: Endocrinology

## 2016-03-27 NOTE — Telephone Encounter (Signed)
Please refill x 1 Ov is due  

## 2016-03-29 ENCOUNTER — Other Ambulatory Visit: Payer: Self-pay

## 2016-03-29 MED ORDER — METFORMIN HCL 1000 MG PO TABS: ORAL_TABLET | ORAL | 1 refills | Status: DC

## 2016-04-15 ENCOUNTER — Emergency Department (HOSPITAL_COMMUNITY)
Admission: EM | Admit: 2016-04-15 | Discharge: 2016-04-16 | Disposition: A | Discharge status: Home / Self Care | Payer: Medicare HMO | Attending: Emergency Medicine | Admitting: Emergency Medicine

## 2016-04-15 ENCOUNTER — Encounter (HOSPITAL_COMMUNITY): Payer: Self-pay | Admitting: Emergency Medicine

## 2016-04-15 ENCOUNTER — Emergency Department (HOSPITAL_COMMUNITY): Payer: Medicare HMO

## 2016-04-15 DIAGNOSIS — J45909 Unspecified asthma, uncomplicated: Secondary | ICD-10-CM | POA: Diagnosis not present

## 2016-04-15 DIAGNOSIS — T588X1A Toxic effect of carbon monoxide from other source, accidental (unintentional), initial encounter: Secondary | ICD-10-CM | POA: Diagnosis not present

## 2016-04-15 DIAGNOSIS — Z87891 Personal history of nicotine dependence: Secondary | ICD-10-CM | POA: Insufficient documentation

## 2016-04-15 DIAGNOSIS — R0602 Shortness of breath: Secondary | ICD-10-CM | POA: Diagnosis not present

## 2016-04-15 DIAGNOSIS — I10 Essential (primary) hypertension: Secondary | ICD-10-CM | POA: Diagnosis not present

## 2016-04-15 DIAGNOSIS — T50904A Poisoning by unspecified drugs, medicaments and biological substances, undetermined, initial encounter: Secondary | ICD-10-CM | POA: Diagnosis not present

## 2016-04-15 DIAGNOSIS — Z7729 Contact with and (suspected ) exposure to other hazardous substances: Secondary | ICD-10-CM | POA: Diagnosis not present

## 2016-04-15 DIAGNOSIS — E119 Type 2 diabetes mellitus without complications: Secondary | ICD-10-CM | POA: Diagnosis not present

## 2016-04-15 LAB — BASIC METABOLIC PANEL
Anion gap: 6 (ref 5–15)
BUN: 24 mg/dL — AB (ref 6–20)
CALCIUM: 8.8 mg/dL — AB (ref 8.9–10.3)
CO2: 27 mmol/L (ref 22–32)
CREATININE: 0.98 mg/dL (ref 0.61–1.24)
Chloride: 107 mmol/L (ref 101–111)
GFR calc Af Amer: >60 mL/min (ref >60)
GFR calc non Af Amer: >60 mL/min (ref >60)
GLUCOSE: 154 mg/dL — AB (ref 65–99)
Potassium: 4.1 mmol/L (ref 3.5–5.1)
Sodium: 140 mmol/L (ref 135–145)

## 2016-04-15 LAB — CBC WITH DIFFERENTIAL/PLATELET
BASOS PCT: 0 %
Basophils Absolute: 0 10*3/uL (ref 0.0–0.1)
EOS ABS: 0 10*3/uL (ref 0.0–0.7)
EOS PCT: 0 %
HEMATOCRIT: 40 % (ref 39.0–52.0)
Hemoglobin: 13.6 g/dL (ref 13.0–17.0)
Lymphocytes Relative: 8 %
Lymphs Abs: 1.1 10*3/uL (ref 0.7–4.0)
MCH: 31.9 pg (ref 26.0–34.0)
MCHC: 34 g/dL (ref 30.0–36.0)
MCV: 93.7 fL (ref 78.0–100.0)
MONO ABS: 0.6 10*3/uL (ref 0.1–1.0)
MONOS PCT: 4 %
Neutro Abs: 10.9 10*3/uL — ABNORMAL HIGH (ref 1.7–7.7)
Neutrophils Relative %: 88 %
Platelets: 192 10*3/uL (ref 150–400)
RBC: 4.27 MIL/uL (ref 4.22–5.81)
RDW: 14 % (ref 11.5–15.5)
WBC: 12.6 10*3/uL — ABNORMAL HIGH (ref 4.0–10.5)

## 2016-04-15 LAB — BLOOD GAS, ARTERIAL
ACID-BASE EXCESS: 1.4 mmol/L (ref 0.0–2.0)
Bicarbonate: 25.4 mmol/L (ref 20.0–28.0)
Drawn by: 235321
FIO2: 100
O2 Saturation: 99.3 %
PCO2 ART: 40.3 mmHg (ref 32.0–48.0)
PH ART: 7.416 (ref 7.350–7.450)
Patient temperature: 98.6
pO2, Arterial: 208 mmHg — ABNORMAL HIGH (ref 83.0–108.0)

## 2016-04-15 LAB — CARBOXYHEMOGLOBIN
CARBOXYHEMOGLOBIN: 18.9 % — AB (ref 0.5–1.5)
Methemoglobin: 1.1 % (ref 0.0–1.5)
O2 SAT: 99.3 %
Total hemoglobin: 13.5 g/dL (ref 12.0–16.0)

## 2016-04-15 LAB — I-STAT TROPONIN, ED: TROPONIN I, POC: 0.01 ng/mL (ref 0.00–0.08)

## 2016-04-15 NOTE — ED Notes (Signed)
Bed: [id number] Expected date:  Expected time:  Means of arrival:  Comments: 75 yo M  Possible carbon monoxide poisoning

## 2016-04-15 NOTE — ED Triage Notes (Signed)
Per EMS pt was at home working on a tractor in basement garage running for 2 hours. Family went to check on pt and pt stated he was light headed and he couldn't use his legs. Pt remained alert and orientated. C02 started at 31 then to 33 and down to 24 on a nonrebreather. Pt denies chest pain or SOB. Pt only c/o drowsiness.

## 2016-04-15 NOTE — ED Provider Notes (Signed)
Emergency Department Provider Note   I have reviewed the triage vital signs and the nursing notes.   HISTORY  Chief Complaint Toxic Inhalation   HPI Matthew Ramirez is a 75 y.o. male with PMH of DM, asthma, HTN, and polycystic kidney disease who presents to the emergency department for evaluation of altered mental status in the setting of the monoxide exposure. The patient was working in a basement where an engine is running. The patient states he's been working in an out of the basement all day. He has open windows and fans going. Family members came down to the basement for this patient having trouble getting off of the tractor that he was working on. They had to assist the patient with getting out to the garage with the door was open. They were unable to carry him any farther and the patient had to crawl the rest of the way out. Henever lost consciousness. He denies any chest pain. EMS arrived and started him on nonrebreather and end-tidal CO2.  Patient is feeling much better on arrival to the ED.   Past Medical History:  Diagnosis Date  . Asthma    as child-no mdi use now  . Bifascicular block 2017  . 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 DM (diabetes mellitus) ([place]) 05/2011  . 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 External hemorrhoids   . Hearing impairment    Occupational noise damage x years.  Has hearing aids AU.  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  Marland Kitchen History of colon polyps 2010  . HTN (hypertension), benign   . Hyperlipidemia   . Liver mass 11/2015   MRI abd equivocal regarding benign/malig--recommended f/u liver u/s with doppler in 3 mo (u/s ordered by Dr. Carlean Purl already)  . Personal history of colonic adenomas 2009; 09/14/2012   Polypectomy 2010 and 2014.  Marland Kitchen Plantar fasciitis    deep tissue laser tx at Dr. Emily Filbert (chiro) helped A LOT!  Marland Kitchen Polycystic kidney disease    Normal renal function as of 11/2015  .  Tachycardia 2017   Echo and event monitor negative.  No recurrence as of cardiology f/u 02/2016.     Patient Active Problem List   Diagnosis Date Noted  . Kidney cysts 12/24/2015  . Liver cyst 12/03/2015  . Liver mass 12/03/2015  . Palpitations 11/18/2015  . 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 Prostate cancer screening 05/27/2015  . Diabetes mellitus without complication ([facility]) [id number]  . Diabetes ([place]) 09/17/2013  . Observation for suspected malignant neoplasm 09/17/2013  . Hemorrhoids, internal, with bleeding 01/16/2013  . Hyperlipidemia 05/18/2011  . HTN (hypertension), benign 05/18/2011  . 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 Personal history of colonic adenomas 07/30/2008    Past Surgical History:  Procedure Laterality Date  . CIRCUMCISION  age 60  . COLONOSCOPY W/ POLYPECTOMY  2009; 2014   09/2012: tubular adenoma--no high grade dysplasia.  Repeat 09/2017 approx.  Diverticulosis and small int hem  . DENTAL SURGERY     10+ root canals, several bridges  . EYE SURGERY     Right (s/p traumatic injury to eye)  . TRANSTHORACIC ECHOCARDIOGRAM  07/16/08; 11/2015   EF 55-60%, mild LVH and RVH, impaired relaxation.  2017 moderate LVH, EF 65-70%, grade I DD.  Marland Kitchen US AORTA: SCREENING  11/2015   No aneurism but "ectatic" portion of aorta was noted; repeat u/s in 5 yrs recommended    Current Outpatient Rx  .  Order #: [id number] Class: Historical Med  . Order #: [id number] Class: Historical Med  . Order #: [id number] Class: Normal  . Order #: [id number] Class: Normal  . Order #: [id number] Class: Historical Med  . Order #: [id number] Class: Normal  . Order #: [id number] Class: Normal  . Order #: [id number] Class: Historical Med  . Order #: [id number] Class: Normal  . Order #: [id number] Class: Normal  . Order #: [id number] Class: Normal  . Order #: [id number] Class: Normal    Allergies Review of patient's allergies indicates no known allergies.  Family History  Problem Relation Age of Onset  .  Cancer Mother     leukemia  . Diabetes Mother   . Cancer Sister     ovarian  . Emphysema Father     smoker  . Diabetes Sister   . Colon cancer Neg Hx   . Rectal cancer Neg Hx   . Stomach cancer Neg Hx   . Pancreatic cancer Neg Hx   . Prostate cancer Neg Hx   . Esophageal cancer Neg Hx   . Liver cancer Neg Hx   . Kidney disease Neg Hx   . Liver disease Neg Hx     Social History Social History  Substance Use Topics  . Smoking status: Former Smoker    Packs/day: 1.00    Years: 20.00    Types: Cigarettes    Quit date: 07/12/1968  . Smokeless tobacco: Never Used  . Alcohol use 0.0 oz/week     Comment: a couple of drinks a week    Review of Systems  Constitutional: No fever/chills. Feeling drunk when initially found by family.  Eyes: No visual changes. ENT: No sore throat. Cardiovascular: Denies chest pain. Respiratory: Denies shortness of breath. Gastrointestinal: No abdominal pain.  No nausea, no vomiting.  No diarrhea.  No constipation. Genitourinary: Negative for dysuria. Musculoskeletal: Negative for back pain. Skin: Negative for rash. Neurological: Negative for headaches, focal weakness or numbness.  10-point ROS otherwise negative.  ____________________________________________   PHYSICAL EXAM:  VITAL SIGNS: ED Triage Vitals  Enc Vitals Group     BP 04/15/16 2054 126/69     Pulse Rate 04/15/16 2054 96     Resp 04/15/16 2054 18     Temp 04/15/16 2054 97.3 F (36.3 C)     Temp Source 04/15/16 2054 Oral     SpO2 04/15/16 2051 98 %   Constitutional: Alert and oriented. Well appearing and in no acute distress. Eyes: Conjunctivae are normal.  Head: Atraumatic. Nose: No congestion/rhinnorhea. Mouth/Throat: Mucous membranes are moist.  Oropharynx non-erythematous. Neck: No stridor.   Cardiovascular: Normal rate, regular rhythm. Good peripheral circulation. Grossly normal heart sounds.   Respiratory: Normal respiratory effort.  No retractions. Lungs  CTAB. Gastrointestinal: Soft and nontender. No distention.  Musculoskeletal: No lower extremity tenderness nor edema. No gross deformities of extremities. Neurologic:  Normal speech and language. No gross focal neurologic deficits are appreciated. Normal gait.  Skin:  Skin is warm, dry and intact. No rash noted. Psychiatric: Mood and affect are normal. Speech and behavior are normal.  ____________________________________________   LABS (all labs ordered are listed, but only abnormal results are displayed)  Labs Reviewed  BASIC METABOLIC PANEL - Abnormal; Notable for the following:       Result Value   Glucose, Bld 154 (*)    BUN 24 (*)    Calcium 8.8 (*)    All other components within normal limits  CBC WITH DIFFERENTIAL/PLATELET - Abnormal; Notable for the following:    WBC 12.6 (*)  Neutro Abs 10.9 (*)    All other components within normal limits  BLOOD GAS, ARTERIAL - Abnormal; Notable for the following:    pO2, Arterial 208 (*)    All other components within normal limits  CARBOXYHEMOGLOBIN - Abnormal; Notable for the following:    Carboxyhemoglobin 18.9 (*)    All other components within normal limits  I-STAT TROPOININ, ED   ____________________________________________  EKG   EKG Interpretation  Date/Time:  Thursday April 15 2016 22:02:31 EDT Ventricular Rate:  85 PR Interval:    QRS Duration: 160 QT Interval:  412 QTC Calculation: 490 R Axis:   37 Text Interpretation:  Sinus rhythm Right bundle branch block No old EKG for comparison. No STEMI.  Confirmed by LONG MD, JOSHUA [phone]) on 04/15/2016 10:32:27 PM Also confirmed by LONG MD, JOSHUA [phone]), editor [place]  CCT, BEVERLY ([id number])  on 04/16/2016 7:59:45 AM      ____________________________________________   PROCEDURES  Procedure(s) performed:   Procedures  CRITICAL CARE Performed by: Margette Fast Total critical care time: 35 minutes Critical care time was exclusive of separately billable  procedures and treating other patients. Critical care was necessary to treat or prevent imminent or life-threatening deterioration. Critical care was time spent personally by me on the following activities: development of treatment plan with patient and/or surrogate as well as nursing, discussions with consultants, evaluation of patient's response to treatment, examination of patient, obtaining history from patient or surrogate, ordering and performing treatments and interventions, ordering and review of laboratory studies, ordering and review of radiographic studies, pulse oximetry and re-evaluation of patient's condition.  Patient required multiple hours of a non-rebreather O2 supply with frequent re-checks to determine if transfer for hyperbaric O2 would be required along with consultation with a toxicologist.   Nanda Quinton, MD Emergency Medicine  ____________________________________________   INITIAL IMPRESSION / [place] / ED COURSE  Pertinent labs & imaging results that were available during my care of the patient were reviewed by me and considered in my medical decision making (see chart for details).  Patient resents to the emergency department for evaluation of likely carbon monoxide poisoning. He is awake and alert at this time. Protecting his airway. He feels back to normal. Plan for labs including troponin, ABG, and carboxyhemoglobin. He will continue nonrebreather oxygen until labs are back.   10:42 PM Spoke with her got a poison control regarding the patient's presentation. His carboxyhemoglobin is 18.9 on COOx. Patient is asymptomatic at this time with normal neurological exam. As an control recommends 4 hours on nonrebreather with repeat neurological exam. If normal exam with no symptoms the patient can be discharged home at that time vs consideration of transfer for hyperbaric O2. No need for repeat or additional labs at this time. Updated the patient and family at  bedside.  ____________________________________________  FINAL CLINICAL IMPRESSION(S) / ED DIAGNOSES  Final diagnoses:  Carbon monoxide exposure     MEDICATIONS GIVEN DURING THIS VISIT:  None  NEW OUTPATIENT MEDICATIONS STARTED DURING THIS VISIT:  None   Note:  This document was prepared using Dragon voice recognition software and may include unintentional dictation errors.  Nanda Quinton, MD Emergency Medicine   Margette Fast, MD 04/17/16 1116

## 2016-04-15 NOTE — ED Notes (Signed)
RN will pull from IV line for blood work 

## 2016-04-15 NOTE — Discharge Instructions (Signed)
You were seen in the ED today with carbon monoxide poisoning. We evaluated you closely in the Emergency Department and believe that you are safe to return home at this time. Follow up with your PCP in the morning to schedule an outpatient visit.   Return to the ED immediately with any chest pain, difficulty breathing, confusion, difficulty walking, weakness, or numbness.

## 2016-04-16 NOTE — ED Provider Notes (Signed)
  Physical Exam  BP 132/75 (BP Location: Left Arm)   Pulse 79   Temp 97.3 F (36.3 C) (Oral)   Resp 16   SpO2 100%   Physical Exam  ED Course  Procedures  MDM  Pt has accidental CO poisoning. Tox recommends NRB for 4 hours. Pt is asymptomatic now and wants to go - but has agreed to get his 4 hour monitoring, No repeat labs needed. We will d/c after 1 am.       Varney Biles, MD 04/16/16 [id number]

## 2016-04-16 NOTE — ED Notes (Signed)
Patient is alert and oriented x3.  He was given DC instructions and follow up visit instructions.  Patient gave verbal understanding.  He was DC ambulatory under his own power to home.  V/S stable.  He was not showing any signs of distress on DC 

## 2016-04-22 ENCOUNTER — Other Ambulatory Visit: Payer: Self-pay | Admitting: Endocrinology

## 2016-04-22 NOTE — Telephone Encounter (Signed)
Please refill x 1 Ov is due  

## 2016-05-07 DIAGNOSIS — R69 Illness, unspecified: Secondary | ICD-10-CM | POA: Diagnosis not present

## 2016-05-23 ENCOUNTER — Other Ambulatory Visit: Payer: Self-pay | Admitting: Endocrinology

## 2016-05-23 NOTE — Telephone Encounter (Signed)
Please refill x 1 Ov is due  

## 2016-05-25 ENCOUNTER — Other Ambulatory Visit: Payer: Self-pay | Admitting: Endocrinology

## 2016-05-27 ENCOUNTER — Other Ambulatory Visit: Payer: Self-pay | Admitting: Family Medicine

## 2016-06-23 ENCOUNTER — Other Ambulatory Visit: Payer: Self-pay | Admitting: Endocrinology

## 2016-06-23 NOTE — Telephone Encounter (Signed)
Please refill x 3 mos Ov is due 

## 2016-06-27 DIAGNOSIS — R69 Illness, unspecified: Secondary | ICD-10-CM | POA: Diagnosis not present

## 2016-08-27 ENCOUNTER — Ambulatory Visit (INDEPENDENT_AMBULATORY_CARE_PROVIDER_SITE_OTHER): Payer: Medicare HMO | Admitting: Family Medicine

## 2016-08-27 ENCOUNTER — Encounter: Payer: Self-pay | Admitting: Family Medicine

## 2016-08-27 VITALS — BP 134/79 | HR 92 | Temp 98.1°F | Resp 16 | Ht 70.0 in | Wt 204.8 lb

## 2016-08-27 DIAGNOSIS — J181 Lobar pneumonia, unspecified organism: Secondary | ICD-10-CM

## 2016-08-27 DIAGNOSIS — J189 Pneumonia, unspecified organism: Secondary | ICD-10-CM

## 2016-08-27 MED ORDER — AZITHROMYCIN 250 MG PO TABS: ORAL_TABLET | ORAL | 0 refills | Status: DC

## 2016-08-27 NOTE — Progress Notes (Signed)
Pre visit review using our clinic review tool, if applicable. No additional management support is needed unless otherwise documented below in the visit note. 

## 2016-08-27 NOTE — Progress Notes (Signed)
OFFICE VISIT  08/27/2016   CC:  Chief Complaint  Patient presents with  . Cough    x 3 days, not very productive, has been taking mucinex but doesn't seem to help much   HPI:    Patient is a 76 y.o. Caucasian male who presents for respiratory complaints. Onset about 3 d/a of deep cough, without nasal congestion/runny nose.  No ST.  No fever. No SOB but sometimes feels it is difficult to adequately take a deep breath.  No chest pain.  Trying mucinex DM but no help at all.  Appetite is good. No flu vaccine this season.   Past Medical History:  Diagnosis Date  . Asthma    as child-no mdi use now  . Bifascicular block 2017  . 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 DM (diabetes mellitus) ([facility]) 05/2011  . 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 External hemorrhoids   . Hearing impairment    Occupational noise damage x years.  Has hearing aids AU.  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  . History of colon polyps 2010  . HTN (hypertension), benign   . Hyperlipidemia   . Liver mass 11/2015   MRI abd equivocal regarding benign/malig--recommended f/u liver u/s with doppler in 3 mo (u/s ordered by Dr. Gessner already)  . Personal history of colonic adenomas 2009; 09/14/2012   Polypectomy 2010 and 2014.  . Plantar fasciitis    deep tissue laser tx at Dr. Boudreaux (chiro) helped A LOT!  . Polycystic kidney disease    Normal renal function as of 11/2015  . Tachycardia 2017   Echo and event monitor negative.  No recurrence as of cardiology f/u 02/2016.     Past Surgical History:  Procedure Laterality Date  . CIRCUMCISION  age 22  . COLONOSCOPY W/ POLYPECTOMY  2009; 2014   09/2012: tubular adenoma--no high grade dysplasia.  Repeat 09/2017 approx.  Diverticulosis and small int hem  . DENTAL SURGERY     10+ root canals, several bridges  . EYE SURGERY     Right (s/p traumatic injury to eye)  . TRANSTHORACIC ECHOCARDIOGRAM  07/16/08; 11/2015   EF  55-60%, mild LVH and RVH, impaired relaxation.  2017 moderate LVH, EF 65-70%, grade I DD.  . US AORTA: SCREENING  11/2015   No aneurism but "ectatic" portion of aorta was noted; repeat u/s in 5 yrs recommended    Outpatient Medications Prior to Visit  Medication Sig Dispense Refill  . B Complex-C (SUPER B COMPLEX PO) Take 2 tablets by mouth daily.    . Blood Glucose Monitoring Suppl (ONE TOUCH ULTRA SYSTEM KIT) W/DEVICE KIT 1 kit by Does not apply route once. 100 each 3  . ibuprofen (ADVIL,MOTRIN) 200 MG tablet Take 400 mg by mouth every 3 (three) hours as needed.    . lisinopril (PRINIVIL,ZESTRIL) 5 MG tablet TAKE 1 TABLET BY MOUTH EVERY DAY 90 tablet 2  . magnesium 30 MG tablet Take 30 mg by mouth daily.    . metFORMIN (GLUCOPHAGE) 1000 MG tablet TAKE 1 TABLET BY MOUTH TWICE DAILY WITH A MEAL 90 tablet 1  . nystatin-triamcinolone ointment (MYCOLOG) Apply 1 application topically 2 (two) times daily. 30 g 0  . ONE TOUCH ULTRA TEST test strip USE AS DIRECTED TO TEST BLOOD GLUCE TWICE DAILY 100 each 11  . ONETOUCH DELICA LANCETS MISC Check glucose twice daily 60 each 5  . pioglitazone (ACTOS) 45 MG tablet Take 1 tablet (  45 mg total) by mouth daily. 90 tablet 3  . senna (SENOKOT) 8.6 MG TABS Take 2 tablets by mouth daily.     Marland Kitchen JANUVIA 100 MG tablet TAKE 1 TABLET BY MOUTH EVERY DAY (Patient not taking: Reported on 08/27/2016) 90 tablet 0  . JANUVIA 100 MG tablet TAKE 1 TABLET BY MOUTH EVERY DAY (Patient not taking: Reported on 08/27/2016) 30 tablet 2   No facility-administered medications prior to visit.     No Known Allergies  ROS As per HPI  PE: Blood pressure 134/79, pulse 92, temperature 98.1 F (36.7 C), temperature source Temporal, resp. rate 16, height 5' 10" (1.778 m), weight 204 lb 12 oz (92.9 kg), SpO2 92 %. Gen: Alert, well appearing.  Patient is oriented to person, place, time, and situation. ENT: Ears: EACs clear, normal epithelium.  TMs with good light reflex and landmarks  bilaterally.  Eyes: no injection, icteris, swelling, or exudate.  EOMI, PERRLA. Nose: no drainage or turbinate edema/swelling.  No injection or focal lesion.  Mouth: lips without lesion/swelling.  Oral mucosa pink and moist.  Dentition intact and without obvious caries or gingival swelling.  Oropharynx without erythema, exudate, or swelling.  Neck - No masses or thyromegaly or limitation in range of motion CV: RRR, no m/r/g.   LUNGS: CTA bilat except subtle early-to-mid inspiratory crackles in L base and L axilla that does not clear with coughing.  Nonlabored resps, good aeration in all lung fields.   LABS:    Chemistry      Component Value Date/Time   NA 140 04/15/2016 2115   K 4.1 04/15/2016 2115   CL 107 04/15/2016 2115   CO2 27 04/15/2016 2115   BUN 24 (H) 04/15/2016 2115   CREATININE 0.98 04/15/2016 2115   CREATININE 0.83 11/13/2015 1333      Component Value Date/Time   CALCIUM 8.8 (L) 04/15/2016 2115   ALKPHOS 63 12/23/2015 0959   AST 17 12/23/2015 0959   ALT 16 12/23/2015 0959   BILITOT 0.6 12/23/2015 0959     IMPRESSION AND PLAN:  CAP: I hear subtle insp crackles in L base + oxygen at low end of normal. He looks great in exam room today. Will treat with Z-pack. Continue mucinex DM q12h prn. Rest, push fluids.  An After Visit Summary was printed and given to the patient.  FOLLOW UP: Return if symptoms worsen or fail to improve.  Signed:  [name], MD           08/27/2016

## 2016-09-09 ENCOUNTER — Telehealth: Payer: Self-pay

## 2016-09-09 DIAGNOSIS — K769 Liver disease, unspecified: Secondary | ICD-10-CM

## 2016-09-09 NOTE — Telephone Encounter (Signed)
Patient is scheduled for MRI with Eovist contrast for 09/20/16 8:00 at [facility]. He verbalized understanding of instructions and I mailed him a letter.

## 2016-09-09 NOTE — Telephone Encounter (Signed)
-----   Message from Marlon Pel, RN sent at 03/09/2016  9:43 AM EDT -----   ----- Message ----- From: Marlon Pel, RN Sent: 03/09/2016   9:43 AM To: Marlon Pel, RN  Liver lesion is stable in size - good news but radiologist still not certain what it is so they recommend repeating an MR abdomen as specified below - in 6 months   Since it is not growing cancer seems unlikely but want to recheck it at least one more time to see    Order MRI this way please:   Follow-up MRI of the abdomen is recommended in 6 months. Recommend  that this be performed with Eovist contrast on follow-up to  delineate differences between an Eovist study versus the initial  gadolinium enhanced study.

## 2016-09-20 ENCOUNTER — Encounter: Payer: Self-pay | Admitting: Family Medicine

## 2016-09-20 ENCOUNTER — Ambulatory Visit (HOSPITAL_COMMUNITY)
Admission: RE | Admit: 2016-09-20 | Discharge: 2016-09-20 | Disposition: A | Discharge status: Home / Self Care | Payer: Medicare HMO | Source: Ambulatory Visit | Attending: Internal Medicine | Admitting: Internal Medicine

## 2016-09-20 DIAGNOSIS — K769 Liver disease, unspecified: Secondary | ICD-10-CM

## 2016-09-20 DIAGNOSIS — Q6101 Congenital single renal cyst: Secondary | ICD-10-CM | POA: Diagnosis not present

## 2016-09-20 DIAGNOSIS — K7689 Other specified diseases of liver: Secondary | ICD-10-CM | POA: Diagnosis not present

## 2016-09-20 LAB — POCT I-STAT CREATININE: CREATININE: 0.8 mg/dL (ref 0.61–1.24)

## 2016-09-20 MED ORDER — GADOBENATE DIMEGLUMINE 529 MG/ML IV SOLN
20.0000 mL | Freq: Once | INTRAVENOUS | Status: AC | PRN
  Administered 2016-09-20: 19 mL via INTRAVENOUS

## 2016-09-20 NOTE — Progress Notes (Signed)
My Chart note to petient Stable hepatic and liver lesions Will consider f/u imaging in 6 mos but overall stability makes me think may not need to Will sort out with patient re: his wishes

## 2016-09-22 ENCOUNTER — Other Ambulatory Visit: Payer: Self-pay | Admitting: Internal Medicine

## 2016-09-22 DIAGNOSIS — L309 Dermatitis, unspecified: Secondary | ICD-10-CM

## 2016-09-22 MED ORDER — NYSTATIN-TRIAMCINOLONE 100000-0.1 UNIT/GM-% EX OINT: TOPICAL_OINTMENT | Freq: Two times a day (BID) | CUTANEOUS | 2 refills | Status: AC

## 2016-09-22 NOTE — Progress Notes (Signed)
Spoke to patient We will place a reminder with plans to repeat MRI in 6 mos per radiology recommendations He has a relative (grandson) that is a radiologist - patient plans to get discs for that person to review

## 2016-10-02 ENCOUNTER — Other Ambulatory Visit: Payer: Self-pay | Admitting: Endocrinology

## 2016-10-02 NOTE — Telephone Encounter (Signed)
Please refill x 1 Ov is due  

## 2016-10-04 ENCOUNTER — Other Ambulatory Visit: Payer: Self-pay

## 2016-10-04 MED ORDER — METFORMIN HCL 1000 MG PO TABS: 1000.0000 mg | ORAL_TABLET | Freq: Two times a day (BID) | ORAL | 0 refills | Status: DC

## 2016-10-04 MED ORDER — PIOGLITAZONE HCL 45 MG PO TABS: ORAL_TABLET | ORAL | 2 refills | Status: DC

## 2016-11-10 ENCOUNTER — Ambulatory Visit (INDEPENDENT_AMBULATORY_CARE_PROVIDER_SITE_OTHER): Payer: Medicare HMO | Admitting: Family Medicine

## 2016-11-10 ENCOUNTER — Encounter: Payer: Self-pay | Admitting: Family Medicine

## 2016-11-10 VITALS — BP 123/76 | HR 92 | Temp 98.3°F | Resp 16 | Ht 70.0 in | Wt 201.5 lb

## 2016-11-10 DIAGNOSIS — M7130 Other bursal cyst, unspecified site: Secondary | ICD-10-CM | POA: Diagnosis not present

## 2016-11-10 NOTE — Progress Notes (Signed)
OFFICE VISIT  11/10/2016   CC:  Chief Complaint  Patient presents with  . Hand Pain    knot on 3rd digidt of left hand x 3 months   HPI:    Patient is a 75 y.o. Caucasian male who presents for knot on left hand middle finger. Saw it about 3 mo ago at first, small focal swelling to the right of midline at DIP of left hand 3rd finger. He has lanced it himself x 3, gets clear gel-like fluid each time, but it heals and comes back each time. No pain.    Past Medical History:  Diagnosis Date  . Asthma    as child-no mdi use now  . Bifascicular block 2017  . 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  . DM (diabetes mellitus) ([place]) 05/2011  . 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 External hemorrhoids   . Hearing impairment    Occupational noise damage x years.  Has hearing aids AU.  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  Marland Kitchen History of colon polyps 2010  . HTN (hypertension), benign   . Hyperlipidemia   . Liver mass 11/2015   MRI abd equivocal regarding benign/malig--recommended f/u liver u/s with doppler in 3 mo (u/s ordered by Dr. Carlean Purl already)  . Personal history of colonic adenomas 2009; 09/14/2012   Polypectomy 2010 and 2014.  Marland Kitchen Plantar fasciitis    deep tissue laser tx at Dr. Emily Filbert (chiro) helped A LOT!  Marland Kitchen Polycystic kidney disease    Normal renal function as of 11/2015.  Stable on imaging 09/2016--recommended repeat in 6 mo.  . Tachycardia 2017   Echo and event monitor negative.  No recurrence as of cardiology f/u 02/2016.     Past Surgical History:  Procedure Laterality Date  . CIRCUMCISION  age 74  . COLONOSCOPY W/ POLYPECTOMY  2009; 2014   09/2012: tubular adenoma--no high grade dysplasia.  Repeat 09/2017 approx.  Diverticulosis and small int hem  . DENTAL SURGERY     10+ root canals, several bridges  . EYE SURGERY     Right (s/p traumatic injury to eye)  .  TRANSTHORACIC ECHOCARDIOGRAM  07/16/08; 11/2015   EF 55-60%, mild LVH and RVH, impaired relaxation.  2017 moderate LVH, EF 65-70%, grade I DD.  Marland Kitchen US AORTA: SCREENING  11/2015   No aneurism but "ectatic" portion of aorta was noted; repeat u/s in 5 yrs recommended    Outpatient Medications Prior to Visit  Medication Sig Dispense Refill  . B Complex-C (SUPER B COMPLEX PO) Take 2 tablets by mouth daily.    . Blood Glucose Monitoring Suppl (ONE TOUCH ULTRA SYSTEM KIT) W/DEVICE KIT 1 kit by Does not apply route once. 100 each 3  . ibuprofen (ADVIL,MOTRIN) 200 MG tablet Take 400 mg by mouth every 3 (three) hours as needed.    Marland Kitchen lisinopril (PRINIVIL,ZESTRIL) 5 MG tablet TAKE 1 TABLET BY MOUTH EVERY DAY 90 tablet 2  . magnesium 30 MG tablet Take 30 mg by mouth daily.    . metFORMIN (GLUCOPHAGE) 1000 MG tablet Take 1 tablet (1,000 mg total) by mouth 2 (two) times daily with a meal. 180 tablet 0  . nystatin-triamcinolone ointment (MYCOLOG) Apply topically 2 (two) times daily. 30 g 2  . ONE TOUCH ULTRA TEST test strip USE AS DIRECTED TO TEST BLOOD GLUCE TWICE DAILY 100 each 11  . ONETOUCH DELICA LANCETS MISC Check  glucose twice daily 60 each 5  . pioglitazone (ACTOS) 45 MG tablet TAKE 1 TABLET(45 MG) BY MOUTH DAILY 90 tablet 2  . senna (SENOKOT) 8.6 MG TABS Take 2 tablets by mouth daily.     Marland Kitchen azithromycin (ZITHROMAX) 250 MG tablet 2 tabs po qd x 1d, then 1 tab po qd x 4d (Patient not taking: Reported on 11/10/2016) 6 tablet 0  . JANUVIA 100 MG tablet TAKE 1 TABLET BY MOUTH EVERY DAY (Patient not taking: Reported on 08/27/2016) 90 tablet 0   No facility-administered medications prior to visit.     No Known Allergies  ROS As per HPI  PE: Blood pressure 123/76, pulse 92, temperature 98.3 F (36.8 C), temperature source Oral, resp. rate 16, height '5\' 10"'$  (1.778 m), weight 201 lb 8 oz (91.4 kg), SpO2 92 %. Gen: Alert, well appearing.  Patient is oriented to person, place, time, and situation. AFFECT:  pleasant, lucid thought and speech. Left hand middle finger with mildly indurated but fluctuant subQ nodular lesion lateral to the midline at DIP joint. Non-tender to light palpation but with firm palpation it is mildly uncomfortable, without erythema or warmth.   It does not move with flexion/extension of finger.  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 Has recurred x 3 after pt incised and drained it himself. As it gets bigger, it starts hurting, which leads to patient self treating it. Will refer to derm for definitive excision.  An After Visit Summary was printed and given to the patient.  FOLLOW UP: Return for keep appt already set for 11/22/16.  Signed:  [name], MD           11/10/2016

## 2016-11-10 NOTE — Progress Notes (Signed)
Pre visit review using our clinic review tool, if applicable. No additional management support is needed unless otherwise documented below in the visit note. 

## 2016-11-18 DIAGNOSIS — M6740 Ganglion, unspecified site: Secondary | ICD-10-CM | POA: Diagnosis not present

## 2016-11-18 DIAGNOSIS — M67442 Ganglion, left hand: Secondary | ICD-10-CM | POA: Diagnosis not present

## 2016-11-22 ENCOUNTER — Encounter: Payer: Self-pay | Admitting: Family Medicine

## 2016-11-22 DIAGNOSIS — H4322 Crystalline deposits in vitreous body, left eye: Secondary | ICD-10-CM | POA: Diagnosis not present

## 2016-11-22 DIAGNOSIS — E119 Type 2 diabetes mellitus without complications: Secondary | ICD-10-CM | POA: Diagnosis not present

## 2016-11-22 DIAGNOSIS — H52203 Unspecified astigmatism, bilateral: Secondary | ICD-10-CM | POA: Diagnosis not present

## 2016-11-22 DIAGNOSIS — Z961 Presence of intraocular lens: Secondary | ICD-10-CM | POA: Diagnosis not present

## 2016-11-22 DIAGNOSIS — H524 Presbyopia: Secondary | ICD-10-CM | POA: Diagnosis not present

## 2016-11-22 DIAGNOSIS — H04123 Dry eye syndrome of bilateral lacrimal glands: Secondary | ICD-10-CM | POA: Diagnosis not present

## 2016-11-22 DIAGNOSIS — H2512 Age-related nuclear cataract, left eye: Secondary | ICD-10-CM | POA: Diagnosis not present

## 2016-11-22 LAB — HM DIABETES EYE EXAM

## 2016-11-24 ENCOUNTER — Other Ambulatory Visit: Payer: Self-pay

## 2016-11-24 DIAGNOSIS — M67844 Other specified disorders of tendon, left hand: Secondary | ICD-10-CM | POA: Diagnosis not present

## 2016-11-24 DIAGNOSIS — M19042 Primary osteoarthritis, left hand: Secondary | ICD-10-CM | POA: Diagnosis not present

## 2016-11-24 DIAGNOSIS — M67442 Ganglion, left hand: Secondary | ICD-10-CM | POA: Diagnosis not present

## 2016-11-24 DIAGNOSIS — M71342 Other bursal cyst, left hand: Secondary | ICD-10-CM | POA: Diagnosis not present

## 2017-01-04 ENCOUNTER — Other Ambulatory Visit: Payer: Self-pay | Admitting: Endocrinology

## 2017-01-05 ENCOUNTER — Telehealth: Payer: Self-pay | Admitting: Family Medicine

## 2017-01-05 NOTE — Telephone Encounter (Signed)
Pls contact pt: I recently got correspondence from [facility] stating that they had discussed starting cholesterol medication with him---because of the cardiovascular benefits of this medication in people with diabetes, regardless of their cholesterol levels.  Ask him if he was indeed contacted by [facility], and if so, confirm that he wants me to rx him the cholesterol med. Also, it doesn't appear from looking at the EMR that he has had any recent follow up with Dr. Loanne Drilling for his diabetes.  Encourage him to make f/u appt with Dr. Loanne Drilling for his DM 2.--thx

## 2017-01-05 NOTE — Telephone Encounter (Signed)
SW pt, he stated that he did speak to [facility] about this but is not interested in starting any other medications at this time. He stated that he will call Dr. Cordelia Pen office to schedule a f/u apt.

## 2017-01-09 DIAGNOSIS — M47816 Spondylosis without myelopathy or radiculopathy, lumbar region: Secondary | ICD-10-CM

## 2017-01-09 HISTORY — DX: Spondylosis without myelopathy or radiculopathy, lumbar region: M47.816

## 2017-01-10 ENCOUNTER — Other Ambulatory Visit: Payer: Self-pay | Admitting: Endocrinology

## 2017-01-20 ENCOUNTER — Encounter: Payer: Self-pay | Admitting: Family Medicine

## 2017-01-20 ENCOUNTER — Ambulatory Visit (INDEPENDENT_AMBULATORY_CARE_PROVIDER_SITE_OTHER): Payer: Medicare HMO | Admitting: Family Medicine

## 2017-01-20 VITALS — BP 128/70 | HR 69 | Temp 98.2°F | Resp 16 | Ht 70.0 in | Wt 201.2 lb

## 2017-01-20 DIAGNOSIS — M545 Low back pain, unspecified: Secondary | ICD-10-CM

## 2017-01-20 MED ORDER — CYCLOBENZAPRINE HCL 10 MG PO TABS: 10.0000 mg | ORAL_TABLET | Freq: Three times a day (TID) | ORAL | 1 refills | Status: DC | PRN

## 2017-01-20 NOTE — Patient Instructions (Addendum)
Take 600 mg ibuprofen both morning and evening with food x 14 days---then stop.  Low Back Sprain Rehab Ask your health care provider which exercises are safe for you. Do exercises exactly as told by your health care provider and adjust them as directed. It is normal to feel mild stretching, pulling, tightness, or discomfort as you do these exercises, but you should stop right away if you feel sudden pain or your pain gets worse. Do not begin these exercises until told by your health care provider. Stretching and range of motion exercises These exercises warm up your muscles and joints and improve the movement and flexibility of your back. These exercises also help to relieve pain, numbness, and tingling. Exercise A: Lumbar rotation  1. Lie on your back on a firm surface and bend your knees. 2. Straighten your arms out to your sides so each arm forms an "L" shape with a side of your body (a 90 degree angle). 3. Slowly move both of your knees to one side of your body until you feel a stretch in your lower back. Try not to let your shoulders move off of the floor. 4. Hold for __________ seconds. 5. Tense your abdominal muscles and slowly move your knees back to the starting position. 6. Repeat this exercise on the other side of your body. Repeat __________ times. Complete this exercise __________ times a day. Exercise B: Prone extension on elbows  1. Lie on your abdomen on a firm surface. 2. Prop yourself up on your elbows. 3. Use your arms to help lift your chest up until you feel a gentle stretch in your abdomen and your lower back. ? This will place some of your body weight on your elbows. If this is uncomfortable, try stacking pillows under your chest. ? Your hips should stay down, against the surface that you are lying on. Keep your hip and back muscles relaxed. 4. Hold for __________ seconds. 5. Slowly relax your upper body and return to the starting position. Repeat __________ times.  Complete this exercise __________ times a day. Strengthening exercises These exercises build strength and endurance in your back. Endurance is the ability to use your muscles for a long time, even after they get tired. Exercise C: Pelvic tilt 1. Lie on your back on a firm surface. Bend your knees and keep your feet flat. 2. Tense your abdominal muscles. Tip your pelvis up toward the ceiling and flatten your lower back into the floor. ? To help with this exercise, you may place a small towel under your lower back and try to push your back into the towel. 3. Hold for __________ seconds. 4. Let your muscles relax completely before you repeat this exercise. Repeat __________ times. Complete this exercise __________ times a day. Exercise D: Alternating arm and leg raises  1. Get on your hands and knees on a firm surface. If you are on a hard floor, you may want to use padding to cushion your knees, such as an exercise mat. 2. Line up your arms and legs. Your hands should be below your shoulders, and your knees should be below your hips. 3. Lift your left leg behind you. At the same time, raise your right arm and straighten it in front of you. ? Do not lift your leg higher than your hip. ? Do not lift your arm higher than your shoulder. ? Keep your abdominal and back muscles tight. ? Keep your hips facing the ground. ? Do not arch  your back. ? Keep your balance carefully, and do not hold your breath. 4. Hold for __________ seconds. 5. Slowly return to the starting position and repeat with your right leg and your left arm. Repeat __________ times. Complete this exercise __________ times a day. Exercise E: Abdominal set with straight leg raise  1. Lie on your back on a firm surface. 2. Bend one of your knees and keep your other leg straight. 3. Tense your abdominal muscles and lift your straight leg up, 4-6 inches (10-15 cm) off the ground. 4. Keep your abdominal muscles tight and hold for  __________ seconds. ? Do not hold your breath. ? Do not arch your back. Keep it flat against the ground. 5. Keep your abdominal muscles tense as you slowly lower your leg back to the starting position. 6. Repeat with your other leg. Repeat __________ times. Complete this exercise __________ times a day. Posture and body mechanics  Body mechanics refers to the movements and positions of your body while you do your daily activities. Posture is part of body mechanics. Good posture and healthy body mechanics can help to relieve stress in your body's tissues and joints. Good posture means that your spine is in its natural S-curve position (your spine is neutral), your shoulders are pulled back slightly, and your head is not tipped forward. The following are general guidelines for applying improved posture and body mechanics to your everyday activities. Standing   When standing, keep your spine neutral and your feet about hip-width apart. Keep a slight bend in your knees. Your ears, shoulders, and hips should line up.  When you do a task in which you stand in one place for a long time, place one foot up on a stable object that is 2-4 inches (5-10 cm) high, such as a footstool. This helps keep your spine neutral. Sitting   When sitting, keep your spine neutral and keep your feet flat on the floor. Use a footrest, if necessary, and keep your thighs parallel to the floor. Avoid rounding your shoulders, and avoid tilting your head forward.  When working at a desk or a computer, keep your desk at a height where your hands are slightly lower than your elbows. Slide your chair under your desk so you are close enough to maintain good posture.  When working at a computer, place your monitor at a height where you are looking straight ahead and you do not have to tilt your head forward or downward to look at the screen. Resting   When lying down and resting, avoid positions that are most painful for  you.  If you have pain with activities such as sitting, bending, stooping, or squatting (flexion-based activities), lie in a position in which your body does not bend very much. For example, avoid curling up on your side with your arms and knees near your chest (fetal position).  If you have pain with activities such as standing for a long time or reaching with your arms (extension-based activities), lie with your spine in a neutral position and bend your knees slightly. Try the following positions:  Lying on your side with a pillow between your knees.  Lying on your back with a pillow under your knees. Lifting   When lifting objects, keep your feet at least shoulder-width apart and tighten your abdominal muscles.  Bend your knees and hips and keep your spine neutral. It is important to lift using the strength of your legs, not your back. Do  not lock your knees straight out.  Always ask for help to lift heavy or awkward objects. This information is not intended to replace advice given to you by your health care provider. Make sure you discuss any questions you have with your health care provider. Document Released: 06/28/2005 Document Revised: 03/04/2016 Document Reviewed: 04/09/2015 Elsevier Interactive Patient Education  [employer].

## 2017-01-20 NOTE — Progress Notes (Signed)
OFFICE VISIT  01/20/2017   CC:  Chief Complaint  Patient presents with  . Back Pain    lower back x 2 weeks   HPI:    Patient is a 76 y.o. Caucasian male who presents for lower back pain. No prior hx of back pain problems. Onset 2-3 weeks ago, no prior strain or trauma.  Progressively worsening in intensity, esp the last 3d. No radiation of pain, no paresthesias.  No pain with staying stationary, but movement brings on severe pain. Took some ibup intermittently since this started.  Also took vicodin on a few occasions, no help. Took flexeril last night--no signif help.  No saddle anesthesia.  No loss of b/b functioning.  ROS: no other joint pains, LE weakness.  Also, see HPI.   Past Medical History:  Diagnosis Date  . Asthma    as child-no mdi use now  . Bifascicular block 2017  . 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  . DM (diabetes mellitus) ([place]) 05/2011  . 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 External hemorrhoids   . Hearing impairment    Occupational noise damage x years.  Has hearing aids AU.  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  Marland Kitchen History of colon polyps 2010  . HTN (hypertension), benign   . Hyperlipidemia   . Liver mass 11/2015   MRI abd equivocal regarding benign/malig--recommended f/u liver u/s with doppler in 3 mo (u/s ordered by Dr. Carlean Purl already)  . Personal history of colonic adenomas 2009; 09/14/2012   Polypectomy 2010 and 2014.  Marland Kitchen Plantar fasciitis    deep tissue laser tx at Dr. Emily Filbert (chiro) helped A LOT!  Marland Kitchen Polycystic kidney disease    Normal renal function as of 11/2015.  Stable on imaging 09/2016--recommended repeat in 6 mo.  . Tachycardia 2017   Echo and event monitor negative.  No recurrence as of cardiology f/u 02/2016.     Past Surgical History:  Procedure Laterality Date  . CIRCUMCISION  age 79  . COLONOSCOPY W/ POLYPECTOMY   2009; 2014   09/2012: tubular adenoma--no high grade dysplasia.  Repeat 09/2017 approx.  Diverticulosis and small int hem  . DENTAL SURGERY     10+ root canals, several bridges  . EYE SURGERY     Right (s/p traumatic injury to eye)  . TRANSTHORACIC ECHOCARDIOGRAM  07/16/08; 11/2015   EF 55-60%, mild LVH and RVH, impaired relaxation.  2017 moderate LVH, EF 65-70%, grade I DD.  Marland Kitchen US AORTA: SCREENING  11/2015   No aneurism but "ectatic" portion of aorta was noted; repeat u/s in 5 yrs recommended    Outpatient Medications Prior to Visit  Medication Sig Dispense Refill  . B Complex-C (SUPER B COMPLEX PO) Take 2 tablets by mouth daily.    . Blood Glucose Monitoring Suppl (ONE TOUCH ULTRA SYSTEM KIT) W/DEVICE KIT 1 kit by Does not apply route once. 100 each 3  . ibuprofen (ADVIL,MOTRIN) 200 MG tablet Take 400 mg by mouth every 3 (three) hours as needed.    Marland Kitchen lisinopril (PRINIVIL,ZESTRIL) 5 MG tablet TAKE 1 TABLET BY MOUTH EVERY DAY 90 tablet 2  . magnesium 30 MG tablet Take 30 mg by mouth daily.    . metFORMIN (GLUCOPHAGE) 1000 MG tablet TAKE 1 TABLET (1,000 MG TOTAL) BY MOUTH 2 (TWO) TIMES DAILY WITH A MEAL. 180 tablet 0  . nystatin-triamcinolone ointment (MYCOLOG) Apply topically 2 (  two) times daily. 30 g 2  . ONE TOUCH ULTRA TEST test strip USE AS DIRECTED TO TEST BLOOD GLUCE TWICE DAILY 100 each 11  . ONETOUCH DELICA LANCETS MISC Check glucose twice daily 60 each 5  . pioglitazone (ACTOS) 45 MG tablet TAKE 1 TABLET(45 MG) BY MOUTH DAILY 90 tablet 2  . senna (SENOKOT) 8.6 MG TABS Take 2 tablets by mouth daily.      No facility-administered medications prior to visit.     No Known Allergies  ROS As per HPI  PE: Blood pressure 128/70, pulse 69, temperature 98.2 F (36.8 C), temperature source Oral, resp. rate 16, height '5\' 10"'$  (1.778 m), weight 201 lb 4 oz (91.3 kg), SpO2 93 %. Gen: Alert, well appearing.  Patient is oriented to person, place, time, and situation. AFFECT: pleasant, lucid  thought and speech. BACK: extremely limited in ROM in flexion, some pain with extension but no limitation.  Twisting and lat bending in low back is fine. Palpation: mild TTP in lateral lumbar spine soft tissues bilat. No midline or paraspinous muscle or facet joint tenderness. Sitting SLR neg.  Spine w/out deformity. No rash or bruising.  LABS:  Lab Results  Component Value Date   HGBA1C 7.0 10/13/2015     Chemistry      Component Value Date/Time   NA 140 04/15/2016 2115   K 4.1 04/15/2016 2115   CL 107 04/15/2016 2115   CO2 27 04/15/2016 2115   BUN 24 (H) 04/15/2016 2115   CREATININE 0.80 09/20/2016 0807   CREATININE 0.83 11/13/2015 1333      Component Value Date/Time   CALCIUM 8.8 (L) 04/15/2016 2115   ALKPHOS 63 12/23/2015 0959   AST 17 12/23/2015 0959   ALT 16 12/23/2015 0959   BILITOT 0.6 12/23/2015 0959      Lab Results  Component Value Date   PSA 0.55 05/11/2011    IMPRESSION AND PLAN:  Acute musculoskeletal low back strain. Due to pt age, will obtain L spine x-ray. Plan: refer to PT.  Take ibuprofen 600 mg bid with food x 14d. He has vicodin at home to use prn severe pain. Flexeril '10mg'$  tid prn rx'd today.  Therapeutic expectations and side effect profile of medication discussed today.  Patient's questions answered. HOme stretching handout given to patient to do until he gets in with PT.  An After Visit Summary was printed and given to the patient.  FOLLOW UP: Return in about 6 weeks (around 03/03/2017) for f/u LBP.  Signed:  [name], MD           01/20/2017

## 2017-01-21 ENCOUNTER — Encounter: Payer: Self-pay | Admitting: Family Medicine

## 2017-01-21 ENCOUNTER — Ambulatory Visit (HOSPITAL_BASED_OUTPATIENT_CLINIC_OR_DEPARTMENT_OTHER)
Admission: RE | Admit: 2017-01-21 | Discharge: 2017-01-21 | Disposition: A | Discharge status: Home / Self Care | Payer: Medicare HMO | Source: Ambulatory Visit | Attending: Family Medicine | Admitting: Family Medicine

## 2017-01-21 DIAGNOSIS — M545 Low back pain, unspecified: Secondary | ICD-10-CM

## 2017-01-21 DIAGNOSIS — M47816 Spondylosis without myelopathy or radiculopathy, lumbar region: Secondary | ICD-10-CM | POA: Diagnosis not present

## 2017-01-21 DIAGNOSIS — M419 Scoliosis, unspecified: Secondary | ICD-10-CM | POA: Diagnosis not present

## 2017-01-21 DIAGNOSIS — I700 Atherosclerosis of aorta: Secondary | ICD-10-CM | POA: Diagnosis not present

## 2017-01-22 DIAGNOSIS — M5136 Other intervertebral disc degeneration, lumbar region: Secondary | ICD-10-CM | POA: Diagnosis not present

## 2017-01-22 DIAGNOSIS — M9903 Segmental and somatic dysfunction of lumbar region: Secondary | ICD-10-CM | POA: Diagnosis not present

## 2017-01-22 DIAGNOSIS — M5135 Other intervertebral disc degeneration, thoracolumbar region: Secondary | ICD-10-CM | POA: Diagnosis not present

## 2017-01-22 DIAGNOSIS — M9902 Segmental and somatic dysfunction of thoracic region: Secondary | ICD-10-CM | POA: Diagnosis not present

## 2017-01-22 DIAGNOSIS — M5137 Other intervertebral disc degeneration, lumbosacral region: Secondary | ICD-10-CM | POA: Diagnosis not present

## 2017-01-22 DIAGNOSIS — M9904 Segmental and somatic dysfunction of sacral region: Secondary | ICD-10-CM | POA: Diagnosis not present

## 2017-01-24 DIAGNOSIS — M9903 Segmental and somatic dysfunction of lumbar region: Secondary | ICD-10-CM | POA: Diagnosis not present

## 2017-01-24 DIAGNOSIS — M9904 Segmental and somatic dysfunction of sacral region: Secondary | ICD-10-CM | POA: Diagnosis not present

## 2017-01-24 DIAGNOSIS — M5137 Other intervertebral disc degeneration, lumbosacral region: Secondary | ICD-10-CM | POA: Diagnosis not present

## 2017-01-24 DIAGNOSIS — M5136 Other intervertebral disc degeneration, lumbar region: Secondary | ICD-10-CM | POA: Diagnosis not present

## 2017-01-24 DIAGNOSIS — M5135 Other intervertebral disc degeneration, thoracolumbar region: Secondary | ICD-10-CM | POA: Diagnosis not present

## 2017-01-24 DIAGNOSIS — M9902 Segmental and somatic dysfunction of thoracic region: Secondary | ICD-10-CM | POA: Diagnosis not present

## 2017-02-04 ENCOUNTER — Telehealth: Payer: Self-pay

## 2017-02-04 NOTE — Telephone Encounter (Signed)
Patient is on the list for [facility] 2018 and may be a good candidate for an AWV. Please let me know if/when appt is scheduled.   

## 2017-02-24 ENCOUNTER — Other Ambulatory Visit: Payer: Self-pay | Admitting: *Deleted

## 2017-02-24 MED ORDER — LISINOPRIL 5 MG PO TABS: 5.0000 mg | ORAL_TABLET | Freq: Every day | ORAL | 0 refills | Status: DC

## 2017-02-24 NOTE — Telephone Encounter (Signed)
[facility]  RF request for lisinopril LOV: 11/13/15 Next ov: 03/03/17 Last written: 05/27/16 #90 w/ 2RF

## 2017-03-03 ENCOUNTER — Encounter: Payer: Self-pay | Admitting: Family Medicine

## 2017-03-03 ENCOUNTER — Ambulatory Visit (INDEPENDENT_AMBULATORY_CARE_PROVIDER_SITE_OTHER): Payer: Medicare HMO | Admitting: Family Medicine

## 2017-03-03 VITALS — BP 126/72 | HR 67 | Temp 98.1°F | Resp 16 | Ht 70.0 in | Wt 200.0 lb

## 2017-03-03 DIAGNOSIS — S39012D Strain of muscle, fascia and tendon of lower back, subsequent encounter: Secondary | ICD-10-CM | POA: Diagnosis not present

## 2017-03-03 NOTE — Progress Notes (Signed)
OFFICE VISIT  03/03/2017   CC:  Chief Complaint  Patient presents with  . Follow-up    lower back pain   HPI:    Patient is a 76 y.o. Caucasian male who presents for 6 week f/u musculoskeletal low back pain. X-ray last visit showed mild deg change in lower lumbar spine. I referred him to PT, rx'd a course of NSAIDs and muscle relaxers. He ended up going to chiropracter x 2 visits and says it has felt excellent since then.  He had to take a few of the flexeril.   No back pain, glut pain, radiating pain, or paresthesias.    Past Medical History:  Diagnosis Date  . Asthma    as child-no mdi use now  . Bifascicular block 2017  . 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  . DM (diabetes mellitus) ([place]) 05/2011  . 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 External hemorrhoids   . Hearing impairment    Occupational noise damage x years.  Has hearing aids AU.  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  Marland Kitchen History of colon polyps 2010  . HTN (hypertension), benign   . Hyperlipidemia   . Liver mass 11/2015   MRI abd equivocal regarding benign/malig--recommended f/u liver u/s with doppler in 3 mo (u/s ordered by Dr. Carlean Purl already)  . 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 Personal history of colonic adenomas 2009; 09/14/2012   Polypectomy 2010 and 2014.  Marland Kitchen Plantar fasciitis    deep tissue laser tx at Dr. Emily Filbert (chiro) helped A LOT!  Marland Kitchen Polycystic kidney disease    Normal renal function as of 11/2015.  Stable on imaging 09/2016--recommended repeat in 6 mo.  . Tachycardia 2017   Echo and event monitor negative.  No recurrence as of cardiology f/u 02/2016.     Past Surgical History:  Procedure Laterality Date  . CIRCUMCISION  age 60  . COLONOSCOPY W/ POLYPECTOMY  2009; 2014   09/2012: tubular adenoma--no high grade dysplasia.  Repeat 09/2017 approx.   Diverticulosis and small int hem  . DENTAL SURGERY     10+ root canals, several bridges  . EYE SURGERY     Right (s/p traumatic injury to eye)  . TRANSTHORACIC ECHOCARDIOGRAM  07/16/08; 11/2015   EF 55-60%, mild LVH and RVH, impaired relaxation.  2017 moderate LVH, EF 65-70%, grade I DD.  Marland Kitchen US AORTA: SCREENING  11/2015   No aneurism but "ectatic" portion of aorta was noted; repeat u/s in 5 yrs recommended    Outpatient Medications Prior to Visit  Medication Sig Dispense Refill  . B Complex-C (SUPER B COMPLEX PO) Take 2 tablets by mouth daily.    . Blood Glucose Monitoring Suppl (ONE TOUCH ULTRA SYSTEM KIT) W/DEVICE KIT 1 kit by Does not apply route once. 100 each 3  . CINNAMON PO Take 1 capsule by mouth daily.    . cyclobenzaprine (FLEXERIL) 10 MG tablet Take 1 tablet (10 mg total) by mouth 3 (three) times daily as needed for muscle spasms. 30 tablet 1  . ibuprofen (ADVIL,MOTRIN) 200 MG tablet Take 400 mg by mouth every 3 (three) hours as needed.    Marland Kitchen lisinopril (PRINIVIL,ZESTRIL) 5 MG tablet Take 1 tablet (5 mg total) by mouth daily. 90 tablet 0  . magnesium 30 MG tablet Take 30 mg by mouth daily.    Marland Kitchen  metFORMIN (GLUCOPHAGE) 1000 MG tablet TAKE 1 TABLET (1,000 MG TOTAL) BY MOUTH 2 (TWO) TIMES DAILY WITH A MEAL. 180 tablet 0  . nystatin-triamcinolone ointment (MYCOLOG) Apply topically 2 (two) times daily. 30 g 2  . ONE TOUCH ULTRA TEST test strip USE AS DIRECTED TO TEST BLOOD GLUCE TWICE DAILY 100 each 11  . ONETOUCH DELICA LANCETS MISC Check glucose twice daily 60 each 5  . pioglitazone (ACTOS) 45 MG tablet TAKE 1 TABLET(45 MG) BY MOUTH DAILY 90 tablet 2  . POTASSIUM PO Take 1 tablet by mouth daily.    Marland Kitchen senna (SENOKOT) 8.6 MG TABS Take 2 tablets by mouth daily.      No facility-administered medications prior to visit.     No Known Allergies  ROS As per HPI  PE: Blood pressure 126/72, pulse 67, temperature 98.1 F (36.7 C), temperature source Oral, resp. rate 16, height '5\' 10"'$   (1.778 m), weight 200 lb (90.7 kg), SpO2 93 %. Body mass index is 28.7 kg/m.  Gen: Alert, well appearing.  Patient is oriented to person, place, time, and situation. AFFECT: pleasant, lucid thought and speech. No further exam today.  LABS:    Chemistry      Component Value Date/Time   NA 140 04/15/2016 2115   K 4.1 04/15/2016 2115   CL 107 04/15/2016 2115   CO2 27 04/15/2016 2115   BUN 24 (H) 04/15/2016 2115   CREATININE 0.80 09/20/2016 0807   CREATININE 0.83 11/13/2015 1333      Component Value Date/Time   CALCIUM 8.8 (L) 04/15/2016 2115   ALKPHOS 63 12/23/2015 0959   AST 17 12/23/2015 0959   ALT 16 12/23/2015 0959   BILITOT 0.6 12/23/2015 0959     Lab Results  Component Value Date   HGBA1C 7.0 10/13/2015   Lab Results  Component Value Date   CHOL 186 05/27/2015   HDL 41 05/27/2015   LDLCALC 105 05/27/2015   TRIG 202 (H) 05/27/2015   CHOLHDL 4.5 05/27/2015   IMPRESSION AND PLAN:  Musculoskeletal LBP: resolved with chiropracter, NSAIDs, muscle relaxers. Pt doing great now. Signs/symptoms to call or return for were reviewed and pt expressed understanding.  An After Visit Summary was printed and given to the patient.  FOLLOW UP: Return in about 6 months (around 09/03/2017) for annual CPE (fasting)--also, schedule AWV with Maudie Mercury at [employer].  Signed:  [name], MD           03/03/2017

## 2017-03-12 DIAGNOSIS — E785 Hyperlipidemia, unspecified: Secondary | ICD-10-CM

## 2017-03-12 HISTORY — DX: Hyperlipidemia, unspecified: E78.5

## 2017-03-25 ENCOUNTER — Telehealth: Payer: Self-pay

## 2017-03-25 DIAGNOSIS — K769 Liver disease, unspecified: Secondary | ICD-10-CM

## 2017-03-25 NOTE — Progress Notes (Signed)
Subjective:   Matthew Ramirez is a 76 y.o. male who presents for Medicare Annual/Subsequent preventive examination.  Review of Systems:  No ROS.  Medicare Wellness Visit. Additional risk factors are reflected in the social history.  Cardiac Risk Factors include: diabetes mellitus;advanced age (>1mn, >>50women);dyslipidemia;hypertension;male gender;obesity (BMI >30kg/m2)   Sleep patterns: Sleeps 5-7 hours.  Home Safety/Smoke Alarms: Feels safe in home. Smoke alarms in place.  Living environment; residence and Firearm Safety: Lives with wife and family in 2 story home.  Seat Belt Safety/Bike Helmet: Wears seat belt.    Male:   CCS-Colonoscopy 10/03/2012, polyps. Recall 5 years.      PSA-  Lab Results  Component Value Date   PSA 0.55 05/11/2011       Objective:    Vitals: BP 128/78 (BP Location: Right Arm, Patient Position: Sitting, Cuff Size: Normal)   Pulse 66   Ht '5\' 10"'$  (1.778 m)   Wt 204 lb 12.8 oz (92.9 kg)   SpO2 96%   BMI 29.39 kg/m   Body mass index is 29.39 kg/m.  Tobacco History  Smoking Status  . Former Smoker  . Packs/day: 1.00  . Years: 20.00  . Types: Cigarettes  . Quit date: 07/12/1968  Smokeless Tobacco  . Never Used     Counseling given: Not Answered   Past Medical History:  Diagnosis Date  . Asthma    as child-no mdi use now  . Bifascicular block 2017  . 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  . DM (diabetes mellitus) ([place] 05/2011  . 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 External hemorrhoids   . Hearing impairment    Occupational noise damage x years.  Has hearing aids AU.  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  .Marland KitchenHistory of colon polyps 2010  . HTN (hypertension), benign   . Hyperlipidemia   . Liver mass 11/2015   MRI abd equivocal regarding benign/malig--recommended f/u liver u/s with doppler in 3 mo (u/s ordered by Dr.  GCarlean Purlalready)  . 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 Personal history of colonic adenomas 2009; 09/14/2012   Polypectomy 2010 and 2014.  .Marland KitchenPlantar fasciitis    deep tissue laser tx at Dr. BEmily Filbert(chiro) helped A LOT!  .Marland KitchenPolycystic kidney disease    Normal renal function as of 11/2015.  Stable on imaging 09/2016--recommended repeat in 6 mo.  . Tachycardia 2017   Echo and event monitor negative.  No recurrence as of cardiology f/u 02/2016.    Past Surgical History:  Procedure Laterality Date  . CIRCUMCISION  age 89 . COLONOSCOPY W/ POLYPECTOMY  2009; 2014   09/2012: tubular adenoma--no high grade dysplasia.  Repeat 09/2017 approx.  Diverticulosis and small int hem  . DENTAL SURGERY     10+ root canals, several bridges  . EYE SURGERY     Right (s/p traumatic injury to eye)  . TRANSTHORACIC ECHOCARDIOGRAM  07/16/08; 11/2015   EF 55-60%, mild LVH and RVH, impaired relaxation.  2017 moderate LVH, EF 65-70%, grade I DD.  .Marland KitchenUKoreaAORTA: SCREENING  11/2015   No aneurism but "ectatic" portion of aorta was noted; repeat u/s in 5 yrs recommended   Family History  Problem Relation Age of Onset  . Cancer Mother        leukemia  . Diabetes Mother   .  Cancer Sister        ovarian  . Emphysema Father        smoker  . Diabetes Sister   . Colon cancer Neg Hx   . Rectal cancer Neg Hx   . Stomach cancer Neg Hx   . Pancreatic cancer Neg Hx   . Prostate cancer Neg Hx   . Esophageal cancer Neg Hx   . Liver cancer Neg Hx   . Kidney disease Neg Hx   . Liver disease Neg Hx    History  Sexual Activity  . Sexual activity: Not on file    Outpatient Encounter Prescriptions as of 03/28/2017  Medication Sig  . Blood Glucose Monitoring Suppl (ONE TOUCH ULTRA SYSTEM KIT) W/DEVICE KIT 1 kit by Does not apply route once.  . cyclobenzaprine (FLEXERIL) 10 MG tablet Take 1 tablet (10 mg total) by mouth 3 (three) times daily as needed for muscle spasms.  Marland Kitchen ibuprofen  (ADVIL,MOTRIN) 200 MG tablet Take 400 mg by mouth every 3 (three) hours as needed.  Marland Kitchen lisinopril (PRINIVIL,ZESTRIL) 5 MG tablet Take 1 tablet (5 mg total) by mouth daily.  . magnesium 30 MG tablet Take 30 mg by mouth daily.  . metFORMIN (GLUCOPHAGE) 1000 MG tablet TAKE 1 TABLET (1,000 MG TOTAL) BY MOUTH 2 (TWO) TIMES DAILY WITH A MEAL.  Marland Kitchen nystatin-triamcinolone ointment (MYCOLOG) Apply topically 2 (two) times daily.  . ONE TOUCH ULTRA TEST test strip USE AS DIRECTED TO TEST BLOOD GLUCE TWICE DAILY  . ONETOUCH DELICA LANCETS MISC Check glucose twice daily  . pioglitazone (ACTOS) 45 MG tablet TAKE 1 TABLET(45 MG) BY MOUTH DAILY  . POTASSIUM PO Take 1 tablet by mouth daily.  Marland Kitchen senna (SENOKOT) 8.6 MG TABS Take 2 tablets by mouth daily.   . B Complex-C (SUPER B COMPLEX PO) Take 2 tablets by mouth daily.  Marland Kitchen CINNAMON PO Take 1 capsule by mouth daily.   No facility-administered encounter medications on file as of 03/28/2017.     Activities of Daily Living In your present state of health, do you have any difficulty performing the following activities: 03/28/2017  Hearing? N  Vision? N  Difficulty concentrating or making decisions? N  Walking or climbing stairs? N  Dressing or bathing? N  Doing errands, shopping? N  Preparing Food and eating ? N  Using the Toilet? N  In the past six months, have you accidently leaked urine? N  Do you have problems with loss of bowel control? N  Managing your Medications? N  Managing your Finances? N  Housekeeping or managing your Housekeeping? N  Some recent data might be hidden    Patient Care Team: Tammi Sou, MD as PCP - General (Family Medicine) Rutherford Guys, MD as Consulting Physician (Ophthalmology) Renato Shin, MD as Consulting Physician (Endocrinology) Martinique, Peter M, MD as Consulting Physician (Cardiology) Gatha Mayer, MD as Consulting Physician (Gastroenterology)   Assessment:    Physical assessment deferred to PCP.  Exercise  Activities and Dietary recommendations Current Exercise Habits: The patient does not participate in regular exercise at present (Yard work; maintenance ), Exercise limited by: None identified   Diet (meal preparation, eat out, water intake, caffeinated beverages, dairy products, fruits and vegetables): Drinks water and coffee.   Diet: Low carb/sugar meals twice daily.    Goals    . patient          Maintain current health by staying active.       Fall Risk Fall Risk  03/28/2017 11/26/2015 06/12/2015 01/23/2015  Falls in the past year? No No No No   Depression Screen PHQ 2/9 Scores 03/28/2017 11/26/2015 06/12/2015 01/23/2015  PHQ - 2 Score 0 0 0 0    Cognitive Function       Ad8 score reviewed for issues:  Issues making decisions: no  Less interest in hobbies / activities: no  Repeats questions, stories (family complaining): no  Trouble using ordinary gadgets (microwave, computer, phone): no  Forgets the month or year: no  Mismanaging finances: no  Remembering appts: no  Daily problems with thinking and/or memory: no Ad8 score is=0     Immunization History  Administered Date(s) Administered  . Tdap 05/04/2011   Screening Tests Health Maintenance  Topic Date Due  . HEMOGLOBIN A1C  04/13/2016  . INFLUENZA VACCINE  10/09/2017 (Originally 02/09/2017)  . PNA vac Low Risk Adult (1 of 2 - PCV13) 03/12/2018 (Originally 11/30/2007)  . COLONOSCOPY  10/03/2017  . OPHTHALMOLOGY EXAM  11/22/2017  . FOOT EXAM  03/28/2018  . TETANUS/TDAP  05/03/2021   Declines vaccines.   Diabetic Foot Exam - Simple   Simple Foot Form Diabetic Foot exam was performed with the following findings:  Yes 03/28/2017  8:43 AM  Visual Inspection No deformities, no ulcerations, no other skin breakdown bilaterally:  Yes Sensation Testing Intact to touch and monofilament testing bilaterally:  Yes Pulse Check Posterior Tibialis and Dorsalis pulse intact bilaterally:  Yes Comments          Plan:    Bring a copy of your living will and/or healthcare power of attorney to your next office visit.  Continue doing brain stimulating activities (puzzles, reading, adult coloring books, staying active) to keep memory sharp.    I have personally reviewed and noted the following in the patient's chart:   . Medical and social history . Use of alcohol, tobacco or illicit drugs  . Current medications and supplements . Functional ability and status . Nutritional status . Physical activity . Advanced directives . List of other physicians . Hospitalizations, surgeries, and ER visits in previous 12 months . Vitals . Screenings to include cognitive, depression, and falls . Referrals and appointments  In addition, I have reviewed and discussed with patient certain preventive protocols, quality metrics, and best practice recommendations. A written personalized care plan for preventive services as well as general preventive health recommendations were provided to patient.     Gerilyn Nestle, RN  03/28/2017

## 2017-03-25 NOTE — Telephone Encounter (Signed)
MRI has been scheduled for Thursday 03/31/17 8:45 arrival for 9:00 MRI at [facility].  Patient notified to be NPO after midnight.

## 2017-03-28 ENCOUNTER — Ambulatory Visit (INDEPENDENT_AMBULATORY_CARE_PROVIDER_SITE_OTHER): Payer: Medicare HMO

## 2017-03-28 VITALS — BP 128/78 | HR 66 | Ht 70.0 in | Wt 204.8 lb

## 2017-03-28 DIAGNOSIS — E785 Hyperlipidemia, unspecified: Secondary | ICD-10-CM | POA: Diagnosis not present

## 2017-03-28 DIAGNOSIS — Z0000 Encounter for general adult medical examination without abnormal findings: Secondary | ICD-10-CM

## 2017-03-28 LAB — LIPID PANEL
CHOL/HDL RATIO: 4
Cholesterol: 225 mg/dL — ABNORMAL HIGH (ref 0–200)
HDL: 58.2 mg/dL (ref >39.00)
LDL Cholesterol: 133 mg/dL — ABNORMAL HIGH (ref 0–99)
NonHDL: 166.72
TRIGLYCERIDES: 169 mg/dL — AB (ref 0.0–149.0)
VLDL: 33.8 mg/dL (ref 0.0–40.0)

## 2017-03-28 NOTE — Patient Instructions (Addendum)
Bring a copy of your living will and/or healthcare power of attorney to your next office visit.  Continue doing brain stimulating activities (puzzles, reading, adult coloring books, staying active) to keep memory sharp.    Fall Prevention in the Home Falls can cause injuries. They can happen to people of all ages. There are many things you can do to make your home safe and to help prevent falls. What can I do on the outside of my home?  Regularly fix the edges of walkways and driveways and fix any cracks.  Remove anything that might make you trip as you walk through a door, such as a raised step or threshold.  Trim any bushes or trees on the path to your home.  Use bright outdoor lighting.  Clear any walking paths of anything that might make someone trip, such as rocks or tools.  Regularly check to see if handrails are loose or broken. Make sure that both sides of any steps have handrails.  Any raised decks and porches should have guardrails on the edges.  Have any leaves, snow, or ice cleared regularly.  Use sand or salt on walking paths during winter.  Clean up any spills in your garage right away. This includes oil or grease spills. What can I do in the bathroom?  Use night lights.  Install grab bars by the toilet and in the tub and shower. Do not use towel bars as grab bars.  Use non-skid mats or decals in the tub or shower.  If you need to sit down in the shower, use a plastic, non-slip stool.  Keep the floor dry. Clean up any water that spills on the floor as soon as it happens.  Remove soap buildup in the tub or shower regularly.  Attach bath mats securely with double-sided non-slip rug tape.  Do not have throw rugs and other things on the floor that can make you trip. What can I do in the bedroom?  Use night lights.  Make sure that you have a light by your bed that is easy to reach.  Do not use any sheets or blankets that are too big for your bed. They  should not hang down onto the floor.  Have a firm chair that has side arms. You can use this for support while you get dressed.  Do not have throw rugs and other things on the floor that can make you trip. What can I do in the kitchen?  Clean up any spills right away.  Avoid walking on wet floors.  Keep items that you use a lot in easy-to-reach places.  If you need to reach something above you, use a strong step stool that has a grab bar.  Keep electrical cords out of the way.  Do not use floor polish or wax that makes floors slippery. If you must use wax, use non-skid floor wax.  Do not have throw rugs and other things on the floor that can make you trip. What can I do with my stairs?  Do not leave any items on the stairs.  Make sure that there are handrails on both sides of the stairs and use them. Fix handrails that are broken or loose. Make sure that handrails are as long as the stairways.  Check any carpeting to make sure that it is firmly attached to the stairs. Fix any carpet that is loose or worn.  Avoid having throw rugs at the top or bottom of the stairs. If  you do have throw rugs, attach them to the floor with carpet tape.  Make sure that you have a light switch at the top of the stairs and the bottom of the stairs. If you do not have them, ask someone to add them for you. What else can I do to help prevent falls?  Wear shoes that: ? Do not have high heels. ? Have rubber bottoms. ? Are comfortable and fit you well. ? Are closed at the toe. Do not wear sandals.  If you use a stepladder: ? Make sure that it is fully opened. Do not climb a closed stepladder. ? Make sure that both sides of the stepladder are locked into place. ? Ask someone to hold it for you, if possible.  Clearly mark and make sure that you can see: ? Any grab bars or handrails. ? First and last steps. ? Where the edge of each step is.  Use tools that help you move around (mobility aids) if  they are needed. These include: ? Canes. ? Walkers. ? Scooters. ? Crutches.  Turn on the lights when you go into a dark area. Replace any light bulbs as soon as they burn out.  Set up your furniture so you have a clear path. Avoid moving your furniture around.  If any of your floors are uneven, fix them.  If there are any pets around you, be aware of where they are.  Review your medicines with your doctor. Some medicines can make you feel dizzy. This can increase your chance of falling. Ask your doctor what other things that you can do to help prevent falls. This information is not intended to replace advice given to you by your health care provider. Make sure you discuss any questions you have with your health care provider. Document Released: 04/24/2009 Document Revised: 12/04/2015 Document Reviewed: 08/02/2014 Elsevier Interactive Patient Education  2018 [place] Maintenance, Male A healthy lifestyle and preventive care is important for your health and wellness. Ask your health care provider about what schedule of regular examinations is right for you. What should I know about weight and diet? Eat a Healthy Diet  Eat plenty of vegetables, fruits, whole grains, low-fat dairy products, and lean protein.  Do not eat a lot of foods high in solid fats, added sugars, or salt.  Maintain a Healthy Weight Regular exercise can help you achieve or maintain a healthy weight. You should:  Do at least 150 minutes of exercise each week. The exercise should increase your heart rate and make you sweat (moderate-intensity exercise).  Do strength-training exercises at least twice a week.  Watch Your Levels of Cholesterol and Blood Lipids  Have your blood tested for lipids and cholesterol every 5 years starting at 74 years of age. If you are at high risk for heart disease, you should start having your blood tested when you are 74 years old. You may need to have your cholesterol  levels checked more often if: ? Your lipid or cholesterol levels are high. ? You are older than 74 years of age. ? You are at high risk for heart disease.  What should I know about cancer screening? Many types of cancers can be detected early and may often be prevented. Lung Cancer  You should be screened every year for lung cancer if: ? You are a current smoker who has smoked for at least 30 years. ? You are a former smoker who has quit within the past  15 years.  Talk to your health care provider about your screening options, when you should start screening, and how often you should be screened.  Colorectal Cancer  Routine colorectal cancer screening usually begins at 74 years of age and should be repeated every 5-10 years until you are 74 years old. You may need to be screened more often if early forms of precancerous polyps or small growths are found. Your health care provider may recommend screening at an earlier age if you have risk factors for colon cancer.  Your health care provider may recommend using home test kits to check for hidden blood in the stool.  A small camera at the end of a tube can be used to examine your colon (sigmoidoscopy or colonoscopy). This checks for the earliest forms of colorectal cancer.  Prostate and Testicular Cancer  Depending on your age and overall health, your health care provider may do certain tests to screen for prostate and testicular cancer.  Talk to your health care provider about any symptoms or concerns you have about testicular or prostate cancer.  Skin Cancer  Check your skin from head to toe regularly.  Tell your health care provider about any new moles or changes in moles, especially if: ? There is a change in a mole's size, shape, or color. ? You have a mole that is larger than a pencil eraser.  Always use sunscreen. Apply sunscreen liberally and repeat throughout the day.  Protect yourself by wearing long sleeves, pants, a  wide-brimmed hat, and sunglasses when outside.  What should I know about heart disease, diabetes, and high blood pressure?  If you are 86-64 years of age, have your blood pressure checked every 3-5 years. If you are 46 years of age or older, have your blood pressure checked every year. You should have your blood pressure measured twice-once when you are at a hospital or clinic, and once when you are not at a hospital or clinic. Record the average of the two measurements. To check your blood pressure when you are not at a hospital or clinic, you can use: ? An automated blood pressure machine at a pharmacy. ? A home blood pressure monitor.  Talk to your health care provider about your target blood pressure.  If you are between 69-72 years old, ask your health care provider if you should take aspirin to prevent heart disease.  Have regular diabetes screenings by checking your fasting blood sugar level. ? If you are at a normal weight and have a low risk for diabetes, have this test once every three years after the age of 75. ? If you are overweight and have a high risk for diabetes, consider being tested at a younger age or more often.  A one-time screening for abdominal aortic aneurysm (AAA) by ultrasound is recommended for men aged 61-75 years who are current or former smokers. What should I know about preventing infection? Hepatitis B If you have a higher risk for hepatitis B, you should be screened for this virus. Talk with your health care provider to find out if you are at risk for hepatitis B infection. Hepatitis C Blood testing is recommended for:  Everyone born from [birth year] through 1963.  Anyone with known risk factors for hepatitis C.  Sexually Transmitted Diseases (STDs)  You should be screened each year for STDs including gonorrhea and chlamydia if: ? You are sexually active and are younger than 74 years of age. ? You are older than 74  years of age and your health care provider  tells you that you are at risk for this type of infection. ? Your sexual activity has changed since you were last screened and you are at an increased risk for chlamydia or gonorrhea. Ask your health care provider if you are at risk.  Talk with your health care provider about whether you are at high risk of being infected with HIV. Your health care provider may recommend a prescription medicine to help prevent HIV infection.  What else can I do?  Schedule regular health, dental, and eye exams.  Stay current with your vaccines (immunizations).  Do not use any tobacco products, such as cigarettes, chewing tobacco, and e-cigarettes. If you need help quitting, ask your health care provider.  Limit alcohol intake to no more than 2 drinks per day. One drink equals 12 ounces of beer, 5 ounces of wine, or 1 ounces of hard liquor.  Do not use street drugs.  Do not share needles.  Ask your health care provider for help if you need support or information about quitting drugs.  Tell your health care provider if you often feel depressed.  Tell your health care provider if you have ever been abused or do not feel safe at home. This information is not intended to replace advice given to you by your health care provider. Make sure you discuss any questions you have with your health care provider. Document Released: 12/25/2007 Document Revised: 02/25/2016 Document Reviewed: 04/01/2015 Elsevier Interactive Patient Education  [employer].

## 2017-03-28 NOTE — Progress Notes (Signed)
AWV reviewed and agree.  Signed:  [name], MD           03/28/2017

## 2017-03-29 ENCOUNTER — Encounter: Payer: Self-pay | Admitting: Family Medicine

## 2017-03-31 ENCOUNTER — Ambulatory Visit (HOSPITAL_COMMUNITY)
Admission: RE | Admit: 2017-03-31 | Discharge: 2017-03-31 | Disposition: A | Discharge status: Home / Self Care | Payer: Medicare HMO | Source: Ambulatory Visit | Attending: Internal Medicine | Admitting: Internal Medicine

## 2017-03-31 DIAGNOSIS — K769 Liver disease, unspecified: Secondary | ICD-10-CM | POA: Insufficient documentation

## 2017-03-31 DIAGNOSIS — K7689 Other specified diseases of liver: Secondary | ICD-10-CM | POA: Diagnosis not present

## 2017-03-31 DIAGNOSIS — N281 Cyst of kidney, acquired: Secondary | ICD-10-CM | POA: Diagnosis not present

## 2017-03-31 LAB — POCT I-STAT CREATININE: Creatinine, Ser: 0.9 mg/dL (ref 0.61–1.24)

## 2017-03-31 MED ORDER — GADOBENATE DIMEGLUMINE 529 MG/ML IV SOLN
20.0000 mL | Freq: Once | INTRAVENOUS | Status: AC | PRN
  Administered 2017-03-31: 20 mL via INTRAVENOUS

## 2017-04-01 ENCOUNTER — Encounter: Payer: Self-pay | Admitting: Family Medicine

## 2017-04-04 DIAGNOSIS — M9904 Segmental and somatic dysfunction of sacral region: Secondary | ICD-10-CM | POA: Diagnosis not present

## 2017-04-04 DIAGNOSIS — M9902 Segmental and somatic dysfunction of thoracic region: Secondary | ICD-10-CM | POA: Diagnosis not present

## 2017-04-04 DIAGNOSIS — M9903 Segmental and somatic dysfunction of lumbar region: Secondary | ICD-10-CM | POA: Diagnosis not present

## 2017-04-04 DIAGNOSIS — M5137 Other intervertebral disc degeneration, lumbosacral region: Secondary | ICD-10-CM | POA: Diagnosis not present

## 2017-04-04 DIAGNOSIS — M5135 Other intervertebral disc degeneration, thoracolumbar region: Secondary | ICD-10-CM | POA: Diagnosis not present

## 2017-04-04 DIAGNOSIS — M5136 Other intervertebral disc degeneration, lumbar region: Secondary | ICD-10-CM | POA: Diagnosis not present

## 2017-04-05 ENCOUNTER — Other Ambulatory Visit: Payer: Self-pay | Admitting: Endocrinology

## 2017-04-07 DIAGNOSIS — M9903 Segmental and somatic dysfunction of lumbar region: Secondary | ICD-10-CM | POA: Diagnosis not present

## 2017-04-07 DIAGNOSIS — M5137 Other intervertebral disc degeneration, lumbosacral region: Secondary | ICD-10-CM | POA: Diagnosis not present

## 2017-04-07 DIAGNOSIS — M9902 Segmental and somatic dysfunction of thoracic region: Secondary | ICD-10-CM | POA: Diagnosis not present

## 2017-04-07 DIAGNOSIS — M9904 Segmental and somatic dysfunction of sacral region: Secondary | ICD-10-CM | POA: Diagnosis not present

## 2017-04-07 DIAGNOSIS — M5136 Other intervertebral disc degeneration, lumbar region: Secondary | ICD-10-CM | POA: Diagnosis not present

## 2017-04-07 DIAGNOSIS — M5135 Other intervertebral disc degeneration, thoracolumbar region: Secondary | ICD-10-CM | POA: Diagnosis not present

## 2017-04-09 DIAGNOSIS — M9904 Segmental and somatic dysfunction of sacral region: Secondary | ICD-10-CM | POA: Diagnosis not present

## 2017-04-09 DIAGNOSIS — M5137 Other intervertebral disc degeneration, lumbosacral region: Secondary | ICD-10-CM | POA: Diagnosis not present

## 2017-04-09 DIAGNOSIS — M5136 Other intervertebral disc degeneration, lumbar region: Secondary | ICD-10-CM | POA: Diagnosis not present

## 2017-04-09 DIAGNOSIS — M9902 Segmental and somatic dysfunction of thoracic region: Secondary | ICD-10-CM | POA: Diagnosis not present

## 2017-04-09 DIAGNOSIS — M9903 Segmental and somatic dysfunction of lumbar region: Secondary | ICD-10-CM | POA: Diagnosis not present

## 2017-04-09 DIAGNOSIS — M5135 Other intervertebral disc degeneration, thoracolumbar region: Secondary | ICD-10-CM | POA: Diagnosis not present

## 2017-04-11 DIAGNOSIS — M5137 Other intervertebral disc degeneration, lumbosacral region: Secondary | ICD-10-CM | POA: Diagnosis not present

## 2017-04-11 DIAGNOSIS — M5430 Sciatica, unspecified side: Secondary | ICD-10-CM

## 2017-04-11 DIAGNOSIS — M9904 Segmental and somatic dysfunction of sacral region: Secondary | ICD-10-CM | POA: Diagnosis not present

## 2017-04-11 DIAGNOSIS — M9903 Segmental and somatic dysfunction of lumbar region: Secondary | ICD-10-CM | POA: Diagnosis not present

## 2017-04-11 DIAGNOSIS — M9902 Segmental and somatic dysfunction of thoracic region: Secondary | ICD-10-CM | POA: Diagnosis not present

## 2017-04-11 DIAGNOSIS — M5136 Other intervertebral disc degeneration, lumbar region: Secondary | ICD-10-CM | POA: Diagnosis not present

## 2017-04-11 DIAGNOSIS — M5135 Other intervertebral disc degeneration, thoracolumbar region: Secondary | ICD-10-CM | POA: Diagnosis not present

## 2017-04-11 HISTORY — DX: Sciatica, unspecified side: M54.30

## 2017-04-14 ENCOUNTER — Encounter: Payer: Self-pay | Admitting: Family Medicine

## 2017-04-14 ENCOUNTER — Ambulatory Visit (INDEPENDENT_AMBULATORY_CARE_PROVIDER_SITE_OTHER): Payer: Medicare HMO | Admitting: Family Medicine

## 2017-04-14 ENCOUNTER — Ambulatory Visit: Payer: Medicare HMO | Admitting: Family Medicine

## 2017-04-14 VITALS — BP 129/74 | HR 84 | Temp 98.3°F | Resp 16 | Ht 70.0 in | Wt 203.0 lb

## 2017-04-14 DIAGNOSIS — M5441 Lumbago with sciatica, right side: Secondary | ICD-10-CM | POA: Diagnosis not present

## 2017-04-14 DIAGNOSIS — M5135 Other intervertebral disc degeneration, thoracolumbar region: Secondary | ICD-10-CM | POA: Diagnosis not present

## 2017-04-14 DIAGNOSIS — M9903 Segmental and somatic dysfunction of lumbar region: Secondary | ICD-10-CM | POA: Diagnosis not present

## 2017-04-14 DIAGNOSIS — M5136 Other intervertebral disc degeneration, lumbar region: Secondary | ICD-10-CM | POA: Diagnosis not present

## 2017-04-14 DIAGNOSIS — M9902 Segmental and somatic dysfunction of thoracic region: Secondary | ICD-10-CM | POA: Diagnosis not present

## 2017-04-14 DIAGNOSIS — M9904 Segmental and somatic dysfunction of sacral region: Secondary | ICD-10-CM | POA: Diagnosis not present

## 2017-04-14 DIAGNOSIS — M5137 Other intervertebral disc degeneration, lumbosacral region: Secondary | ICD-10-CM | POA: Diagnosis not present

## 2017-04-14 MED ORDER — PREDNISONE 20 MG PO TABS: ORAL_TABLET | ORAL | 0 refills | Status: DC

## 2017-04-14 NOTE — Progress Notes (Signed)
OFFICE VISIT  04/14/2017   CC:  Chief Complaint  Patient presents with  . Back Pain   HPI:    Patient is a 76 y.o.  male who presents for back pain. He has hx of L spine mild DJD, no hx of back surgery. Onset about 3 weeks ago, intermittent R gluteal region sharp pain that radiates down lateral/anterior aspect of R thigh to knee level.  Has been seeing chiropractor lately several times but no help. The radiation is intermittent.  Worse when he gets up an walks around.  No paresthesias or leg weakness.  No saddle anesthesia, no loss of bowel/bladder control. Seems relieved some by crossing R leg over L knee. Pain seems to be getting worse, more often.   Flexeril last night helped, ibuprofen 800 mg tid - qid--helps some but not in last 1-2 days. NO recollection of physical strain--but it did start when he changed the blade on his lawnmower and layed on R glut region while doing it.  No trauma.  He had acute episode of non-radicular LBP about 3 months ago, which got significantly better with chiropractor treatments, NSAIDs, flexeril prn and vicodin prn.  DG lumbar spine complete on 01/21/17: IMPRESSION: 1. No acute findings. 2. Mild scoliosis. 3. Mild degenerative change in the lower lumbar spine. 4. Aortic atherosclerosis   Past Medical History:  Diagnosis Date  . Asthma    as child-no mdi use now  . Bifascicular block 2017  . 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  Stable cysts 03/31/17 MRI.  . DM (diabetes mellitus) ([place]) 05/2011  . 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 External hemorrhoids   . Hearing impairment    Occupational noise damage x years.  Has hearing aids AU.  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  Marland Kitchen History of colon polyps 2010  . HTN (hypertension), benign   . Hyperlipidemia 03/2017   Recommended statin 03/2017; pt declined (he has done this multiple  times)  . Liver mass 11/2015   MRI abd equivocal regarding benign/malig--recommended f/u liver u/s with doppler in 3 mo (u/s ordered by Dr. Carlean Purl already)  . 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 Personal history of colonic adenomas 2009; 09/14/2012   Polypectomy 2010 and 2014.  Marland Kitchen Plantar fasciitis    deep tissue laser tx at Dr. Emily Filbert (chiro) helped A LOT!  Marland Kitchen Polycystic kidney disease    Normal renal function as of 11/2015.  Stable on imaging 09/2016--recommended repeat in 6 mo.  Stable bilat cysts on MRI 03/31/17  . Tachycardia 2017   Echo and event monitor negative.  No recurrence as of cardiology f/u 02/2016.     Past Surgical History:  Procedure Laterality Date  . CIRCUMCISION  age 52  . COLONOSCOPY W/ POLYPECTOMY  2009; 2014   09/2012: tubular adenoma--no high grade dysplasia.  Repeat 09/2017 approx.  Diverticulosis and small int hem  . DENTAL SURGERY     10+ root canals, several bridges  . EYE SURGERY     Right (s/p traumatic injury to eye)  . TRANSTHORACIC ECHOCARDIOGRAM  07/16/08; 11/2015   EF 55-60%, mild LVH and RVH, impaired relaxation.  2017 moderate LVH, EF 65-70%, grade I DD.  Marland Kitchen US AORTA: SCREENING  11/2015   No aneurism but "ectatic" portion of aorta was noted; repeat u/s in 5 yrs recommended    Outpatient  Medications Prior to Visit  Medication Sig Dispense Refill  . B Complex-C (SUPER B COMPLEX PO) Take 2 tablets by mouth daily.    . Blood Glucose Monitoring Suppl (ONE TOUCH ULTRA SYSTEM KIT) W/DEVICE KIT 1 kit by Does not apply route once. 100 each 3  . CINNAMON PO Take 1 capsule by mouth daily.    . cyclobenzaprine (FLEXERIL) 10 MG tablet Take 1 tablet (10 mg total) by mouth 3 (three) times daily as needed for muscle spasms. 30 tablet 1  . ibuprofen (ADVIL,MOTRIN) 200 MG tablet Take 400 mg by mouth every 3 (three) hours as needed.    Marland Kitchen lisinopril (PRINIVIL,ZESTRIL) 5 MG tablet Take 1 tablet (5 mg total) by mouth daily. 90 tablet 0  .  magnesium 30 MG tablet Take 30 mg by mouth daily.    . metFORMIN (GLUCOPHAGE) 1000 MG tablet TAKE 1 TABLET (1,000 MG TOTAL) BY MOUTH 2 (TWO) TIMES DAILY WITH A MEAL. 180 tablet 0  . nystatin-triamcinolone ointment (MYCOLOG) Apply topically 2 (two) times daily. 30 g 2  . ONE TOUCH ULTRA TEST test strip USE AS DIRECTED TO TEST BLOOD GLUCE TWICE DAILY 100 each 11  . ONETOUCH DELICA LANCETS MISC Check glucose twice daily 60 each 5  . pioglitazone (ACTOS) 45 MG tablet TAKE 1 TABLET(45 MG) BY MOUTH DAILY 90 tablet 2  . POTASSIUM PO Take 1 tablet by mouth daily.    Marland Kitchen senna (SENOKOT) 8.6 MG TABS Take 2 tablets by mouth daily.      No facility-administered medications prior to visit.     No Known Allergies  ROS As per HPI  PE: Blood pressure 129/74, pulse 84, temperature 98.3 F (36.8 C), temperature source Oral, resp. rate 16, height '5\' 10"'$  (1.778 m), weight 203 lb (92.1 kg), SpO2 95 %. Gen: Alert, well appearing.  Patient is oriented to person, place, time, and situation. AFFECT: pleasant, lucid thought and speech. BACK: ROM intact, mild discomfort with rotation and with lateral flexion. No TTP over ischial tuberosity region.  No tenderness to palpation over any of low back or SI joint regions, even at the focal point where he says his pain starts (at the upper aspect of R glut). Supine: he can lift R leg when it is held with knee extended and this brings some pain in location at R upper glut, but no signif radic pain or paresthesias.  Supine SLR equivocal on R, beginning at 15 deg, NEG on L. LE strength 5/5 prox/dist bilat. Sensation intact. Patellar and achilles DTRs trace bilat.   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 PT referral.  No new imaging today. Prednisone '40mg'$  qd x 5d, then '20mg'$  qd x 5d. Stop ibuprofen/NSAIDs. Home back stretching regimen given to patient. He has flexeril  and vicodin at home from previous back pain problem that he will use prn. Signs/symptoms to call or return for were reviewed and pt expressed understanding.  An After Visit Summary was printed and given to the patient.  FOLLOW UP: Return in about 6 weeks (around 05/26/2017) for f/u LBP/radic.  Signed:  [name], MD           04/14/2017

## 2017-04-14 NOTE — Patient Instructions (Signed)
Low Back Sprain Rehab  Ask your health care provider which exercises are safe for you. Do exercises exactly as told by your health care provider and adjust them as directed. It is normal to feel mild stretching, pulling, tightness, or discomfort as you do these exercises, but you should stop right away if you feel sudden pain or your pain gets worse. Do not begin these exercises until told by your health care provider.  Stretching and range of motion exercises  These exercises warm up your muscles and joints and improve the movement and flexibility of your back. These exercises also help to relieve pain, numbness, and tingling.  Exercise A: Lumbar rotation    1. Lie on your back on a firm surface and bend your knees.  2. Straighten your arms out to your sides so each arm forms an "L" shape with a side of your body (a 90 degree angle).  3. Slowly move both of your knees to one side of your body until you feel a stretch in your lower back. Try not to let your shoulders move off of the floor.  4. Hold for __________ seconds.  5. Tense your abdominal muscles and slowly move your knees back to the starting position.  6. Repeat this exercise on the other side of your body.  Repeat __________ times. Complete this exercise __________ times a day.  Exercise B: Prone extension on elbows    1. Lie on your abdomen on a firm surface.  2. Prop yourself up on your elbows.  3. Use your arms to help lift your chest up until you feel a gentle stretch in your abdomen and your lower back.  ? This will place some of your body weight on your elbows. If this is uncomfortable, try stacking pillows under your chest.  ? Your hips should stay down, against the surface that you are lying on. Keep your hip and back muscles relaxed.  4. Hold for __________ seconds.  5. Slowly relax your upper body and return to the starting position.  Repeat __________ times. Complete this exercise __________ times a day.  Strengthening exercises  These  exercises build strength and endurance in your back. Endurance is the ability to use your muscles for a long time, even after they get tired.  Exercise C: Pelvic tilt  1. Lie on your back on a firm surface. Bend your knees and keep your feet flat.  2. Tense your abdominal muscles. Tip your pelvis up toward the ceiling and flatten your lower back into the floor.  ? To help with this exercise, you may place a small towel under your lower back and try to push your back into the towel.  3. Hold for __________ seconds.  4. Let your muscles relax completely before you repeat this exercise.  Repeat __________ times. Complete this exercise __________ times a day.  Exercise D: Alternating arm and leg raises    1. Get on your hands and knees on a firm surface. If you are on a hard floor, you may want to use padding to cushion your knees, such as an exercise mat.  2. Line up your arms and legs. Your hands should be below your shoulders, and your knees should be below your hips.  3. Lift your left leg behind you. At the same time, raise your right arm and straighten it in front of you.  ? Do not lift your leg higher than your hip.  ? Do not lift your arm   higher than your shoulder.  ? Keep your abdominal and back muscles tight.  ? Keep your hips facing the ground.  ? Do not arch your back.  ? Keep your balance carefully, and do not hold your breath.  4. Hold for __________ seconds.  5. Slowly return to the starting position and repeat with your right leg and your left arm.  Repeat __________ times. Complete this exercise __________ times a day.  Exercise E: Abdominal set with straight leg raise    1. Lie on your back on a firm surface.  2. Bend one of your knees and keep your other leg straight.  3. Tense your abdominal muscles and lift your straight leg up, 4-6 inches (10-15 cm) off the ground.  4. Keep your abdominal muscles tight and hold for __________ seconds.  ? Do not hold your breath.  ? Do not arch your back. Keep it  flat against the ground.  5. Keep your abdominal muscles tense as you slowly lower your leg back to the starting position.  6. Repeat with your other leg.  Repeat __________ times. Complete this exercise __________ times a day.  Posture and body mechanics    Body mechanics refers to the movements and positions of your body while you do your daily activities. Posture is part of body mechanics. Good posture and healthy body mechanics can help to relieve stress in your body's tissues and joints. Good posture means that your spine is in its natural S-curve position (your spine is neutral), your shoulders are pulled back slightly, and your head is not tipped forward. The following are general guidelines for applying improved posture and body mechanics to your everyday activities.  Standing    · When standing, keep your spine neutral and your feet about hip-width apart. Keep a slight bend in your knees. Your ears, shoulders, and hips should line up.  · When you do a task in which you stand in one place for a long time, place one foot up on a stable object that is 2-4 inches (5-10 cm) high, such as a footstool. This helps keep your spine neutral.  Sitting    · When sitting, keep your spine neutral and keep your feet flat on the floor. Use a footrest, if necessary, and keep your thighs parallel to the floor. Avoid rounding your shoulders, and avoid tilting your head forward.  · When working at a desk or a computer, keep your desk at a height where your hands are slightly lower than your elbows. Slide your chair under your desk so you are close enough to maintain good posture.  · When working at a computer, place your monitor at a height where you are looking straight ahead and you do not have to tilt your head forward or downward to look at the screen.  Resting    · When lying down and resting, avoid positions that are most painful for you.  · If you have pain with activities such as sitting, bending, stooping, or squatting  (flexion-based activities), lie in a position in which your body does not bend very much. For example, avoid curling up on your side with your arms and knees near your chest (fetal position).  · If you have pain with activities such as standing for a long time or reaching with your arms (extension-based activities), lie with your spine in a neutral position and bend your knees slightly. Try the following positions:  · Lying on your side with a   pillow between your knees.  · Lying on your back with a pillow under your knees.  Lifting    · When lifting objects, keep your feet at least shoulder-width apart and tighten your abdominal muscles.  · Bend your knees and hips and keep your spine neutral. It is important to lift using the strength of your legs, not your back. Do not lock your knees straight out.  · Always ask for help to lift heavy or awkward objects.  This information is not intended to replace advice given to you by your health care provider. Make sure you discuss any questions you have with your health care provider.  Document Released: 06/28/2005 Document Revised: 03/04/2016 Document Reviewed: 04/09/2015  Elsevier Interactive Patient Education © 2018 [employer]

## 2017-04-18 DIAGNOSIS — M5441 Lumbago with sciatica, right side: Secondary | ICD-10-CM | POA: Diagnosis not present

## 2017-04-22 DIAGNOSIS — M5441 Lumbago with sciatica, right side: Secondary | ICD-10-CM | POA: Diagnosis not present

## 2017-04-28 IMAGING — US US AORTA SCREENING (MEDICARE)
1 series · 14 of 19 positions shown · non-contrast
Comparison: None.

CLINICAL DATA: Male between 65-75 years of age with a smoking
history.

EXAM:
US ABDOMINAL AORTA MEDICARE SCREENING
TECHNIQUE: Ultrasound examination of the abdominal aorta was performed as a
screening evaluation for abdominal aortic aneurysm.

[Series 1: us aorta screening (medicare) · 0.32mm/px · 14 of 19 slices shown]
[im 1/19]
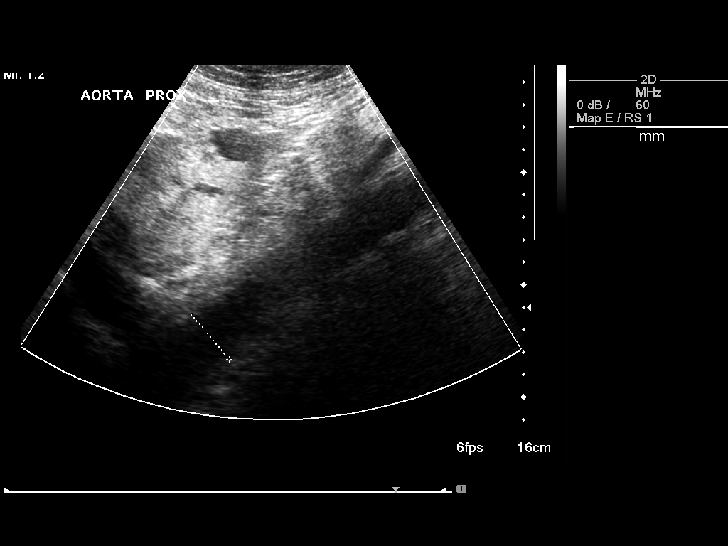
[im 3/19]
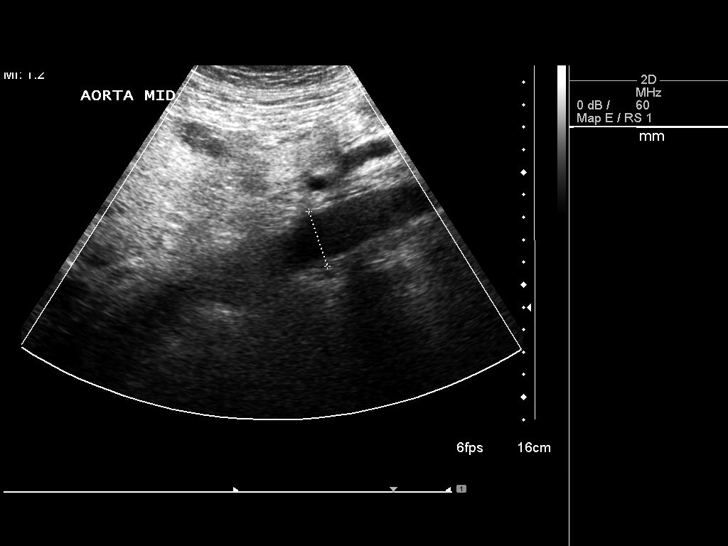
[im 4/19]
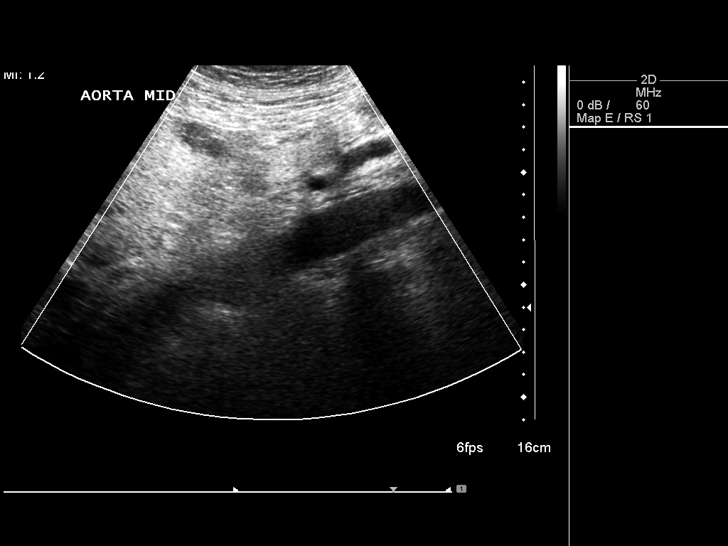
[im 5/19]
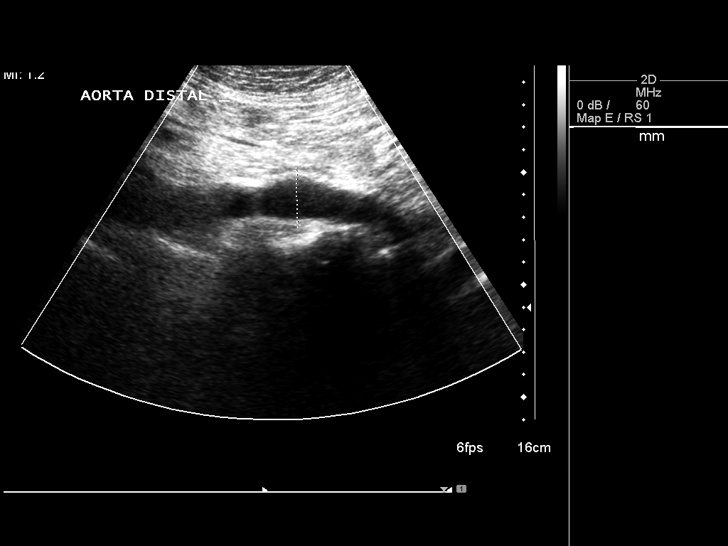
[im 7/19]
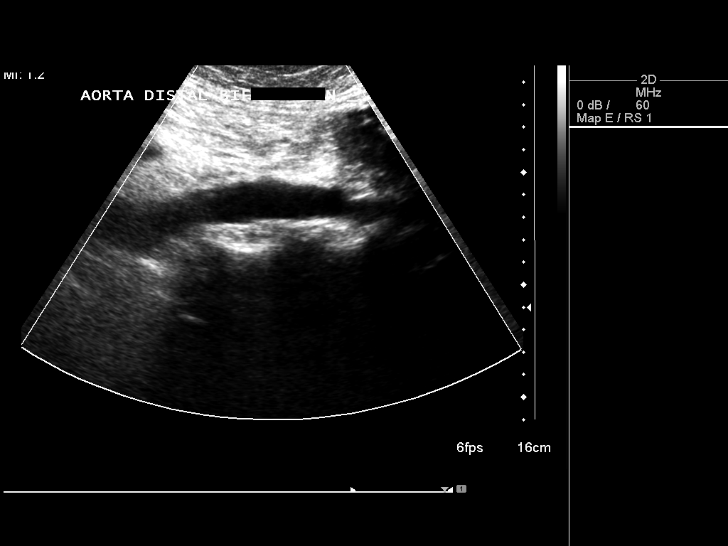
[im 8/19]
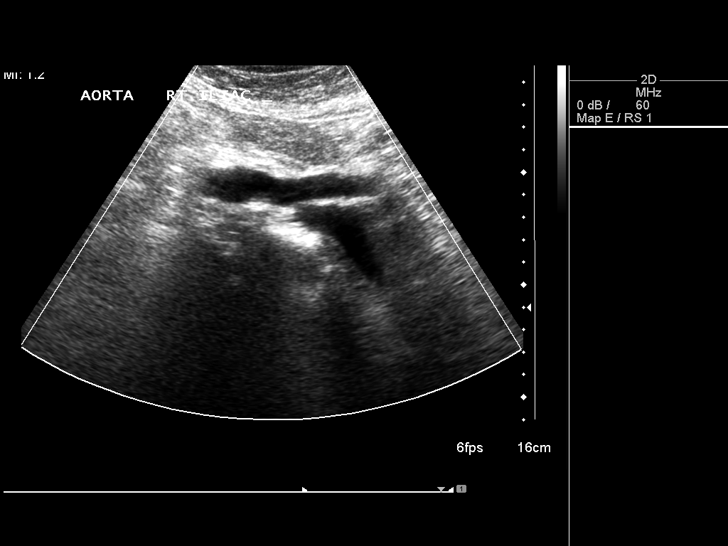
[im 9/19]
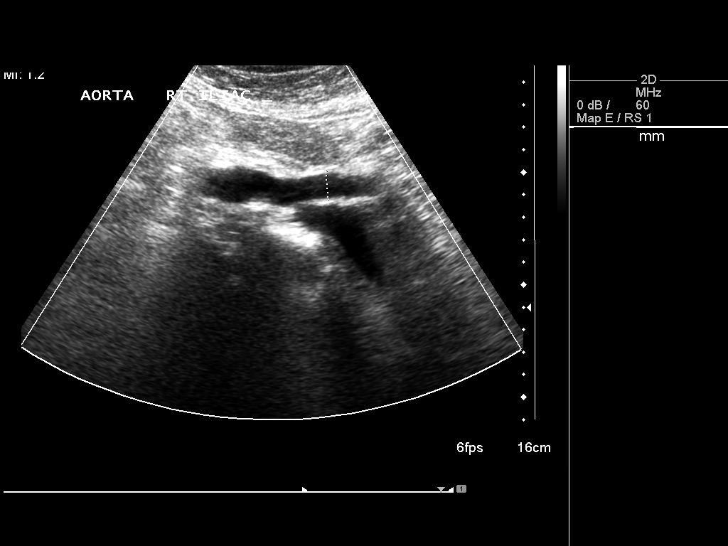
[im 11/19]
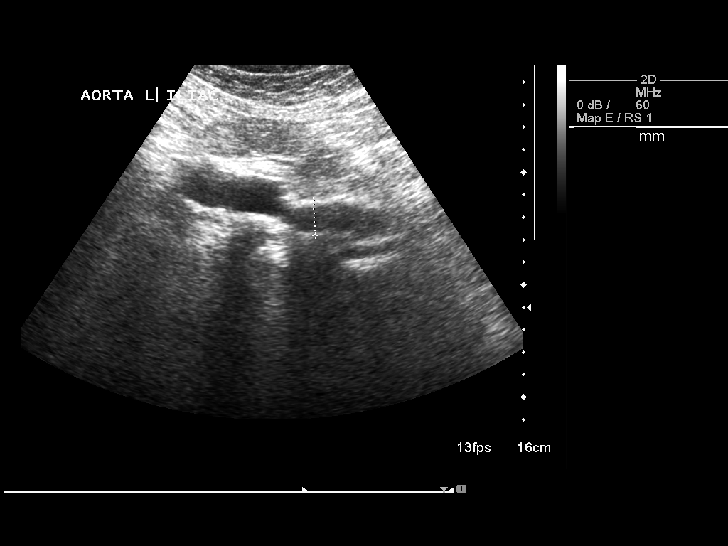
[im 12/19]
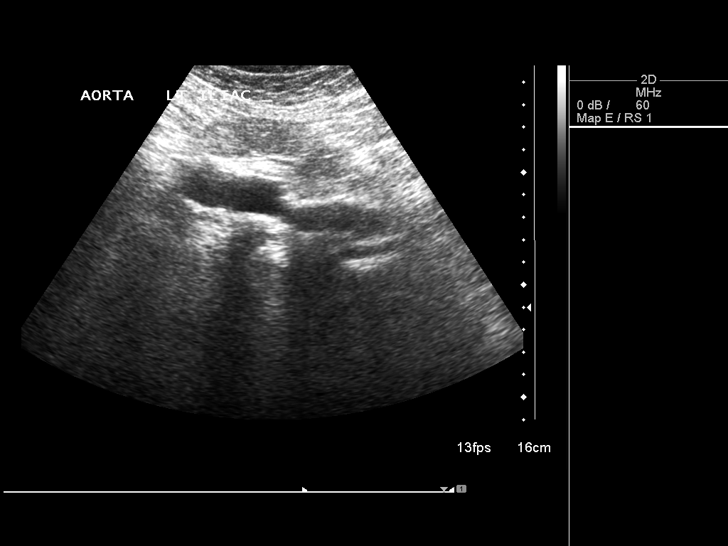
[im 13/19]
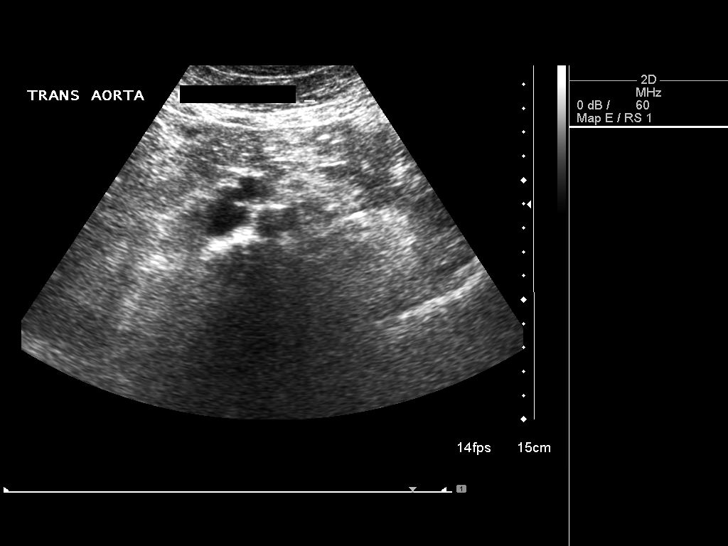
[im 15/19]
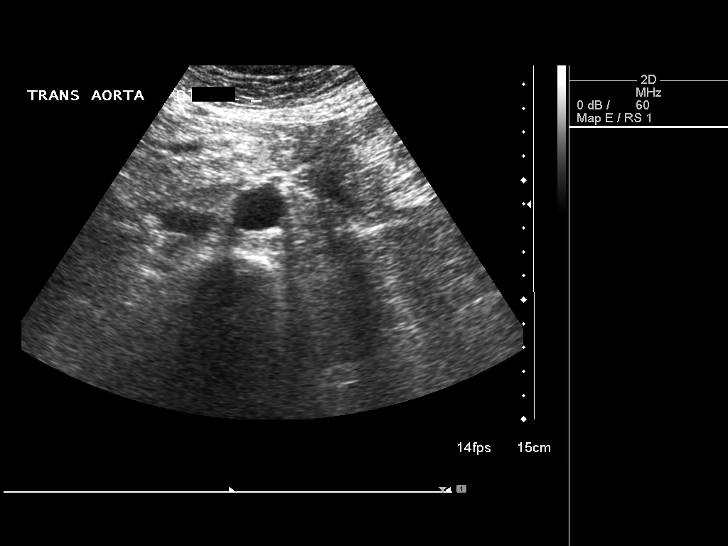
[im 16/19]
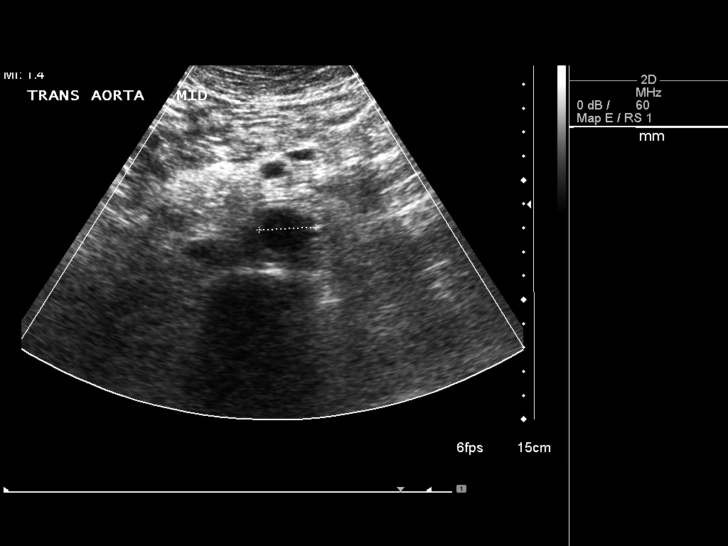
[im 17/19]
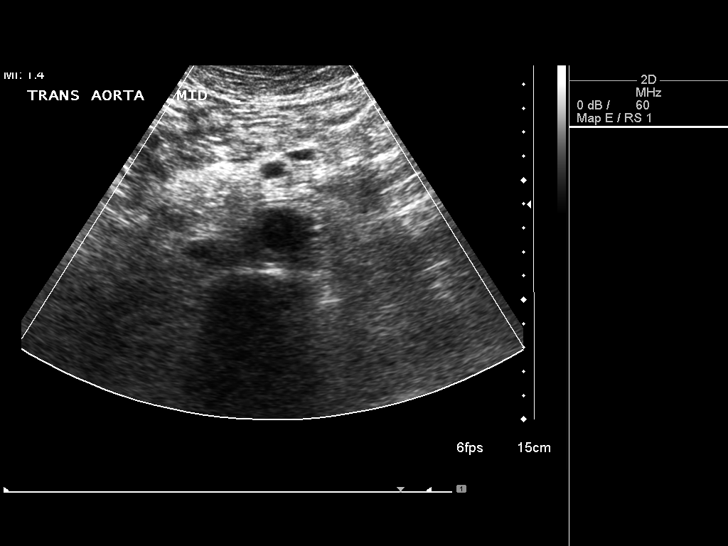
[im 19/19]
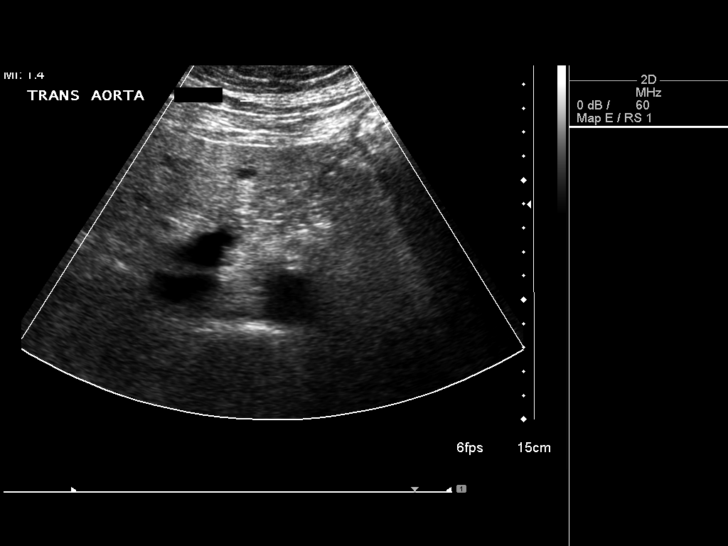

[14 of 19 positions shown; findings below may reference images not displayed]

FINDINGS: There is a minimal amount of eccentric mixed echogenic plaque within
a mildly ectatic abdominal aorta with measurements as follows:

Proximal:  2.7 cm

Mid:  2.6 cm

Distal:  2.4 cm

Right common iliac artery:  1.4 cm.

Left Common iliac artery:  1.6 cm.
IMPRESSION: Mild ectasia of the mid aspect of the abdominal aorta measuring
cm in diameter.

Recommend followup by ultrasound in 5 years. This recommendation
follows ACR consensus guidelines: White Paper of the ACR [employer]

## 2017-04-29 ENCOUNTER — Ambulatory Visit (INDEPENDENT_AMBULATORY_CARE_PROVIDER_SITE_OTHER): Payer: Medicare HMO | Admitting: Family Medicine

## 2017-04-29 ENCOUNTER — Encounter: Payer: Self-pay | Admitting: Family Medicine

## 2017-04-29 VITALS — BP 133/74 | HR 73 | Temp 97.9°F | Resp 16 | Ht 70.0 in | Wt 204.8 lb

## 2017-04-29 DIAGNOSIS — M25551 Pain in right hip: Secondary | ICD-10-CM | POA: Diagnosis not present

## 2017-04-29 DIAGNOSIS — M5431 Sciatica, right side: Secondary | ICD-10-CM

## 2017-04-29 NOTE — Progress Notes (Signed)
OFFICE VISIT  04/29/2017   CC:  Chief Complaint  Patient presents with  . Back Pain    Lower back     HPI:    Patient is a 76 y.o. Caucasian male who presents for ongoing back pain.  I saw him for this 04/14/17 and referred him to physical therapy.  I also did a 10d prednisone taper.  He was taking flexeril and vicodin prn, and I advised him to stop NSAIDs b/c he had tried these long enough.  He went to PT x 2 sessions and says it hurt him so bad he took 2 days to recover after each session.  Refuses to go back.   He still has R LB pain that radiates into R glut and all the way down to knee cap.  No tingling or numbness. Pain is severe, made better by sitting quite a while and by sitting with R leg crossed over L knee. Walking makes it the worst.  Taking flexeril regularly and has continued to take ibuprofen regularly.  No vicodin use. Denies groin pain.  Past Medical History:  Diagnosis Date  . Asthma    as child-no mdi use now  . Bifascicular block 2017  . 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  Stable cysts 03/31/17 MRI.  . DM (diabetes mellitus) ([place]) 05/2011  . 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 External hemorrhoids   . Hearing impairment    Occupational noise damage x years.  Has hearing aids AU.  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  Marland Kitchen History of colon polyps 2010  . HTN (hypertension), benign   . Hyperlipidemia 03/2017   Recommended statin 03/2017; pt declined (he has done this multiple times)  . Liver mass 11/2015   MRI abd equivocal regarding benign/malig--recommended f/u liver u/s with doppler in 3 mo (u/s ordered by Dr. Carlean Purl already)  . 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 Personal history of colonic adenomas 2009; 09/14/2012   Polypectomy 2010 and 2014.  Marland Kitchen Plantar fasciitis    deep tissue laser tx at Dr. Emily Filbert (chiro)  helped A LOT!  Marland Kitchen Polycystic kidney disease    Normal renal function as of 11/2015.  Stable on imaging 09/2016--recommended repeat in 6 mo.  Stable bilat cysts on MRI 03/31/17  . Tachycardia 2017   Echo and event monitor negative.  No recurrence as of cardiology f/u 02/2016.     Past Surgical History:  Procedure Laterality Date  . CIRCUMCISION  age 55  . COLONOSCOPY W/ POLYPECTOMY  2009; 2014   09/2012: tubular adenoma--no high grade dysplasia.  Repeat 09/2017 approx.  Diverticulosis and small int hem  . DENTAL SURGERY     10+ root canals, several bridges  . EYE SURGERY     Right (s/p traumatic injury to eye)  . TRANSTHORACIC ECHOCARDIOGRAM  07/16/08; 11/2015   EF 55-60%, mild LVH and RVH, impaired relaxation.  2017 moderate LVH, EF 65-70%, grade I DD.  Marland Kitchen US AORTA: SCREENING  11/2015   No aneurism but "ectatic" portion of aorta was noted; repeat u/s in 5 yrs recommended    Outpatient Medications Prior to Visit  Medication Sig Dispense Refill  . B Complex-C (SUPER B COMPLEX PO) Take 2 tablets by mouth daily.    . Blood Glucose Monitoring Suppl (ONE TOUCH ULTRA SYSTEM KIT) W/DEVICE KIT 1 kit by Does not  apply route once. 100 each 3  . CINNAMON PO Take 1 capsule by mouth daily.    . cyclobenzaprine (FLEXERIL) 10 MG tablet Take 1 tablet (10 mg total) by mouth 3 (three) times daily as needed for muscle spasms. 30 tablet 1  . ibuprofen (ADVIL,MOTRIN) 200 MG tablet Take 400 mg by mouth every 3 (three) hours as needed.    Marland Kitchen lisinopril (PRINIVIL,ZESTRIL) 5 MG tablet Take 1 tablet (5 mg total) by mouth daily. 90 tablet 0  . magnesium 30 MG tablet Take 30 mg by mouth daily.    . metFORMIN (GLUCOPHAGE) 1000 MG tablet TAKE 1 TABLET (1,000 MG TOTAL) BY MOUTH 2 (TWO) TIMES DAILY WITH A MEAL. 180 tablet 0  . nystatin-triamcinolone ointment (MYCOLOG) Apply topically 2 (two) times daily. 30 g 2  . ONE TOUCH ULTRA TEST test strip USE AS DIRECTED TO TEST BLOOD GLUCE TWICE DAILY 100 each 11  . ONETOUCH DELICA  LANCETS MISC Check glucose twice daily 60 each 5  . pioglitazone (ACTOS) 45 MG tablet TAKE 1 TABLET(45 MG) BY MOUTH DAILY 90 tablet 2  . POTASSIUM PO Take 1 tablet by mouth daily.    Marland Kitchen senna (SENOKOT) 8.6 MG TABS Take 2 tablets by mouth daily.     . predniSONE (DELTASONE) 20 MG tablet 2 tabs po qd x 5d, then 1 tab po qd x 5d (Patient not taking: Reported on 04/29/2017) 15 tablet 0   No facility-administered medications prior to visit.     No Known Allergies  ROS As per HPI  PE: Blood pressure 133/74, pulse 73, temperature 97.9 F (36.6 C), temperature source Oral, resp. rate 16, height '5\' 10"'$  (1.778 m), weight 204 lb 12 oz (92.9 kg), SpO2 95 %. Gen: Alert, well appearing.  Patient is oriented to person, place, time, and situation. AFFECT: pleasant, lucid thought and speech. BACK: ROM intact.  No tenderness to L spine, SI joints, ischial tuberosity, or lateral hip or groin.  No pain in hip with flexion or IR/ER.  No lateral hip TTP. Sitting SLR neg bilat.  LE strength 5/5 prox/dist bilat.  LABS:  None today  IMPRESSION AND PLAN:  1) R sided sciatica vs R DDD with spinal nerve impingement. Sciatica seems more likely.  No help with my meds at last visit, PT made him worse. Will get plain film of hip to make sure no hip pathology is masquerading as his current sx's. Will ask sports med to see him.  He'll continue NSAIDs, relative rest, flexeril prn.    An After Visit Summary was printed and given to the patient.  FOLLOW UP: Return for as needed.  Signed:  [name], MD           04/29/2017

## 2017-04-30 IMAGING — MR MR ABDOMEN WO/W CM
11 of 17 series · 24 of 48 positions shown · IV contrast (multihance)
Comparison: 12/03/2015

CLINICAL DATA: Known hepatic cysts, a recent ultrasound showed a
mass in the liver.

EXAM:
MRI ABDOMEN WITHOUT AND WITH CONTRAST
TECHNIQUE: Multiplanar multisequence MR imaging of the abdomen was performed
both before and after the administration of intravenous contrast.
CONTRAST:  17mL MULTIHANCE GADOBENATE DIMEGLUMINE 529 MG/ML IV SOLN

[Series 3: cor haste · coronal · 5.0mm · 0.78mm/px · 1 of 32 slices shown]
[im 1/32]
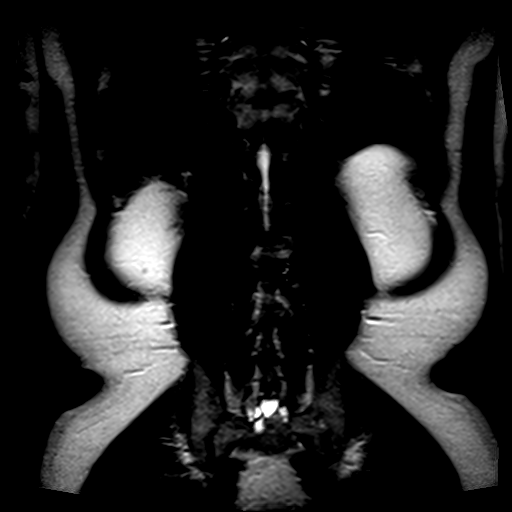

[Series 4: axial haste · axial · 6.0mm · 0.74mm/px · 1 of 32 slices shown]
[im 1/32]
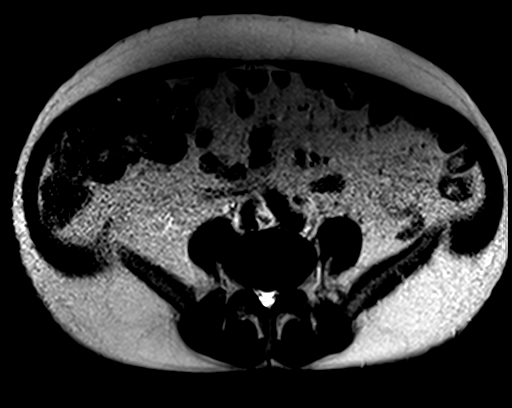

[Series 5: T1 · axial · 6.0mm · 0.78mm/px · 1 of 64 slices shown]
[im 1/64]
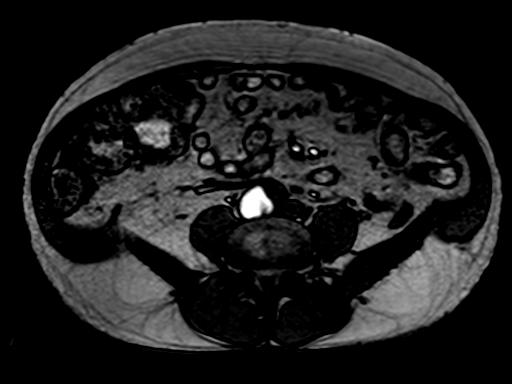

[Series 6: T2 · axial · 6.0mm · 1.25mm/px · 1 of 30 slices shown]
[im 1/30]
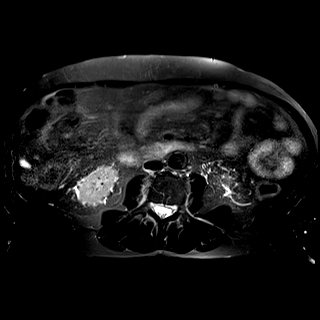

[Series 7: ep2d_diff_b50_500_800_p2_trig · axial · 6.0mm · 2.08mm/px · z∈[-84,+125]mm · 2 of 90 slices shown]
[im 1/90]
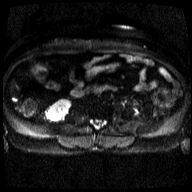
[im 90/90]
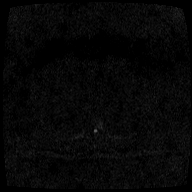

[Series 8: ep2d_diff_b50_500_800_p2_trig_adc · axial · 6.0mm · 2.08mm/px · 1 of 30 slices shown]
[im 1/30]
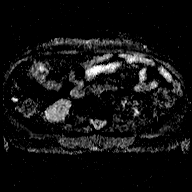

[Series 9: bSSFP · axial · 4.0mm · 0.78mm/px · z∈[-144,+92]mm · 2 of 60 slices shown]
[im 1/60]
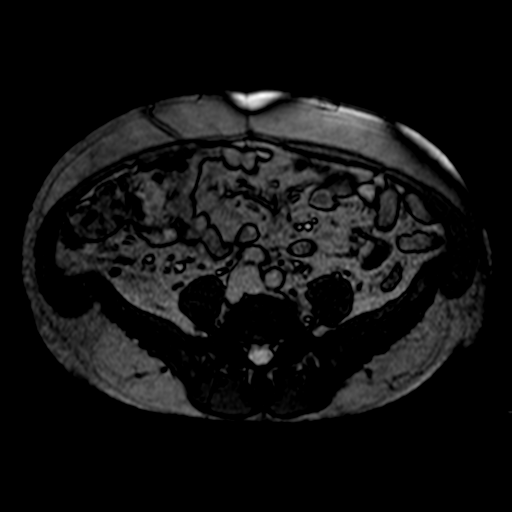
[im 60/60]
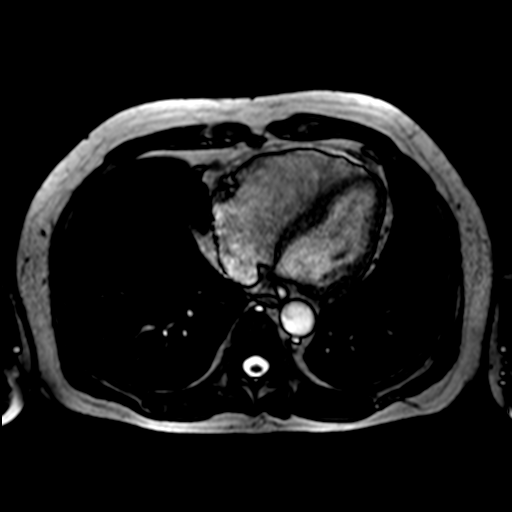

[Series 11: T1 dynamic · axial · non-contrast · 2.5mm · 0.78mm/px · z∈[-134,+104]mm · 4 of 96 slices shown]
[im 1/96]
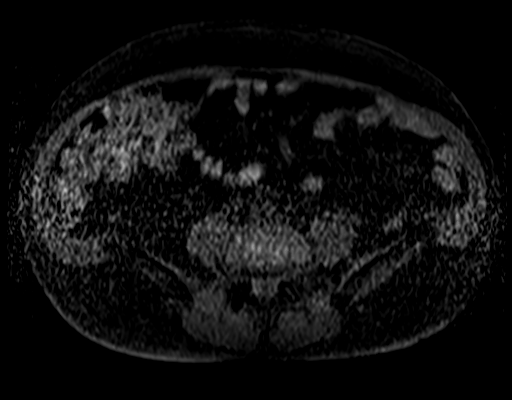
[im 32/96]
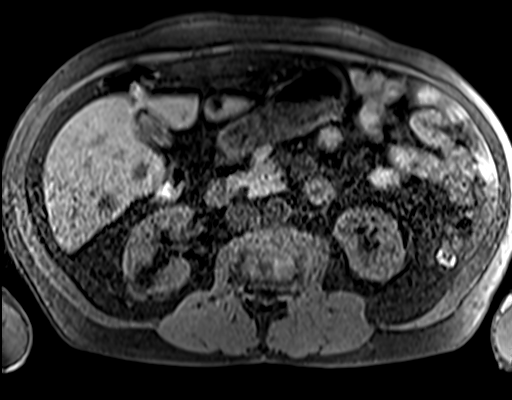
[im 64/96]
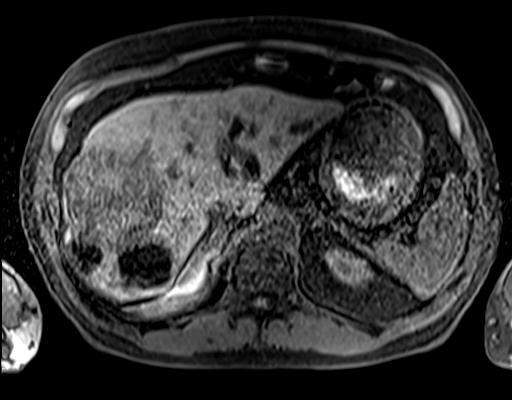
[im 96/96]
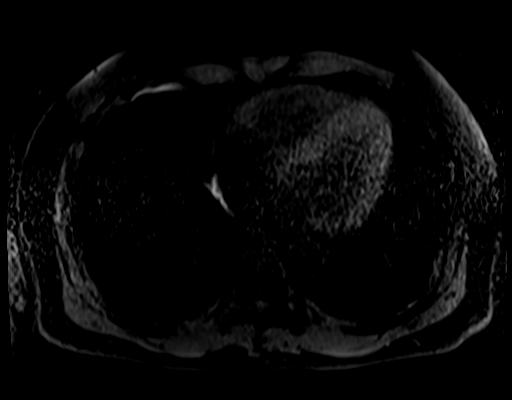

[Series 12: T1 dynamic post-contrast · axial · 2.5mm · 0.78mm/px · z∈[-134,+104]mm · 4 of 96 slices shown (1 of 3)]
[im 1/96]
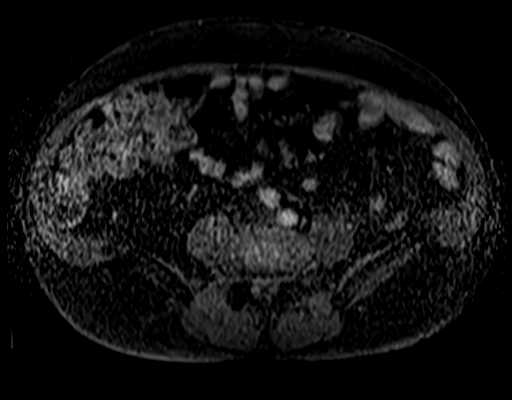
[im 32/96]
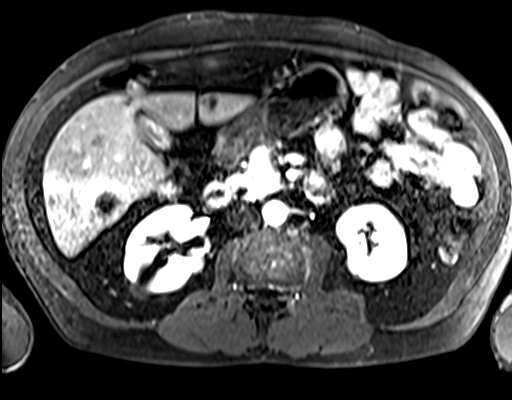
[im 64/96]
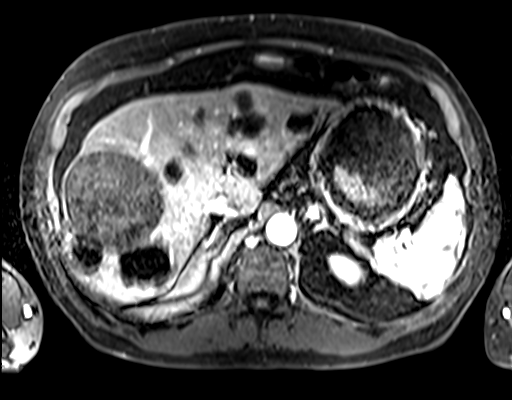
[im 96/96]
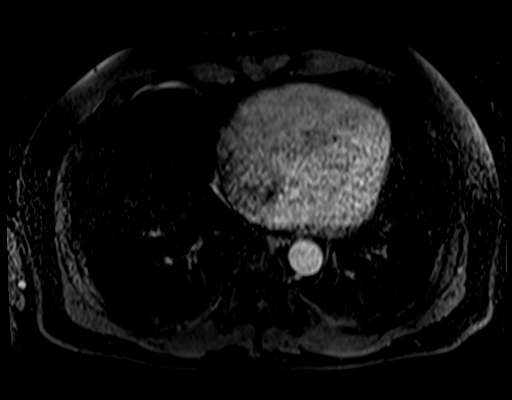

[Series 13: T1 dynamic post-contrast · axial · 2.5mm · 0.78mm/px · z∈[-134,+104]mm · 4 of 96 slices shown (2 of 3)]
[im 1/96]
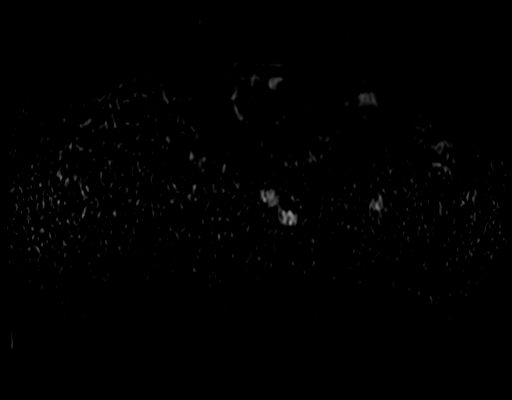
[im 32/96]
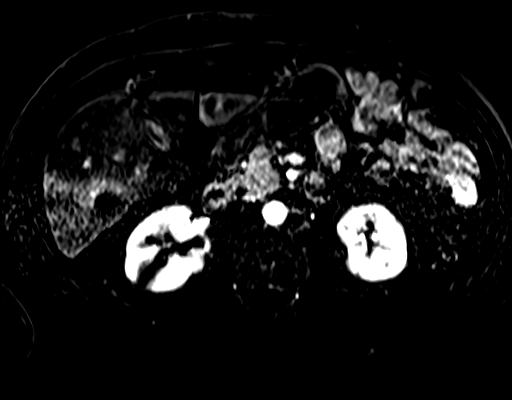
[im 64/96]
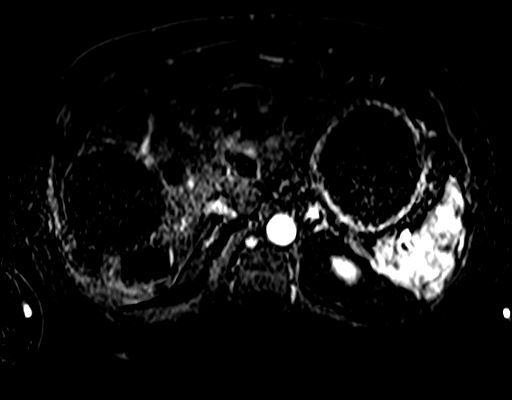
[im 96/96]
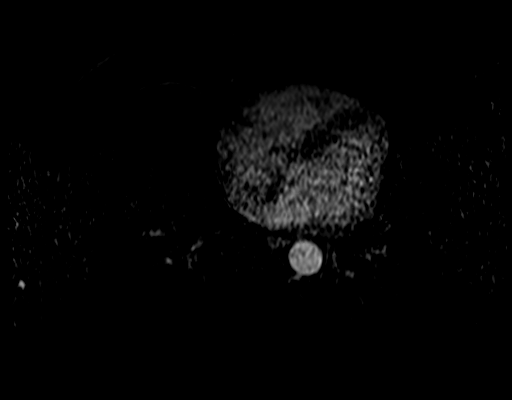

[Series 14: T1 dynamic post-contrast · axial · 2.5mm · 0.78mm/px · z∈[-134,+24]mm · 3 of 96 slices shown (3 of 3)]
[im 1/96]
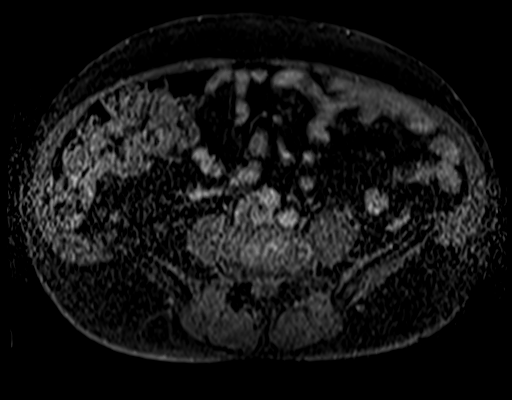
[im 32/96]
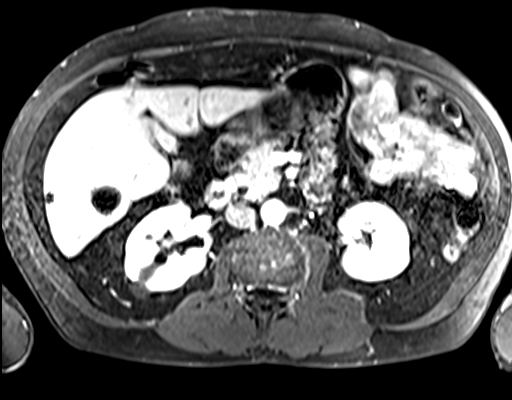
[im 64/96]
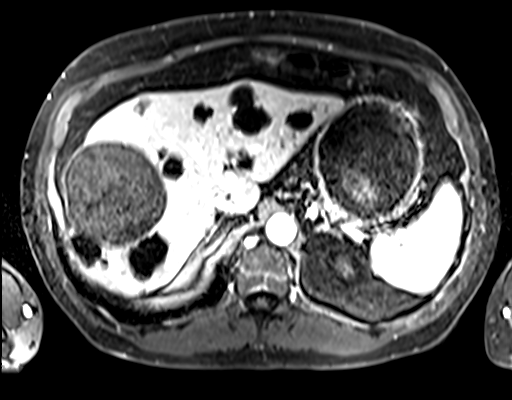

[24 of 48 positions shown; findings below may reference images not displayed]

FINDINGS: Lower chest:  Unremarkable

Hepatobiliary: Innumerable cysts of various size are scattered
throughout the liver, many with thin internal septations, but
without internal nodular enhancement. One lesion posteriorly in the
right hepatic lobe measures 4.7 by 4.2 by 4.4 cm on image [DATE].

Partially exophytic from the right hepatic lobe, there is an 8.7 by
8.3 by 7.3 cm lesion with primarily high but speckled heterogeneous
T2 signal hyperintensity, speckled and heterogeneous but primarily
hypointense T1 signal, and lack of observed enhancement. Based on
size and location this is thought to correspond to the lesions seen
at ultrasound.

Pancreas: Unremarkable

Spleen: Unremarkable

Adrenals/Urinary Tract: Bilateral renal cysts, non with abnormal
enhancement. The dominant right renal cyst spans along some renal
parenchyma which causes a bandlike margin of septation with some
enhancement, but I believe that this is simply normal renal
parenchyma surrounded by the cyst cyst rather than true nodular
enhancement.

Stomach/Bowel: Unremarkable

Vascular/Lymphatic: Unremarkable

Other: No supplemental non-categorized findings.

Musculoskeletal: Thoracic and lumbar spondylosis and degenerative
disc disease
IMPRESSION: 1. Complex lesion in the right hepatic lobe corresponding to the
lesion seen on ultrasound does not have any appreciable enhancement
on subtraction images. I struggle to reconcile the current complete
lack of enhancement in this lesion with the blood flow suggested on
the prior ultrasound. The prior ultrasound was not a dedicated
Doppler exam, and the technologist did not perform any Doppler
waveform characterization of flow within this mass. It may be that
the appearance of flow in the mass on the color Doppler images was
caused by swirling of debris or is not actually real blood flow. I
guess it is possible that potentially vascular portions or bleeding
of this lesion versus from 2 days ago have now completely infarcted
or stopped bleeding, leaving no internal enhancement, but that seems
far fetched. In any case, given the lack of enhancement, I feel that
this lesion might be reasonably followed. I would suggest a 3 month
follow up ultrasound exam to included Doppler waveform interrogation
of the lesion for flow, if any.
2. Innumerable cystic lesions throughout the liver, with multiple
fine septations probably from polycystic liver disease, less likely
correlates disease or biliary hamartomas.
3. Thoracic and lumbar spondylosis and degenerative disc disease.

## 2017-04-30 IMAGING — CR DG ORBITS FOR FOREIGN BODY
2 series · 2 of 2 positions shown · non-contrast
Comparison: None.

CLINICAL DATA: Metal working/exposure; clearance prior to MRI

EXAM:
ORBITS FOR FOREIGN BODY - 2 VIEW

[w orbit pa (1 of 2)]
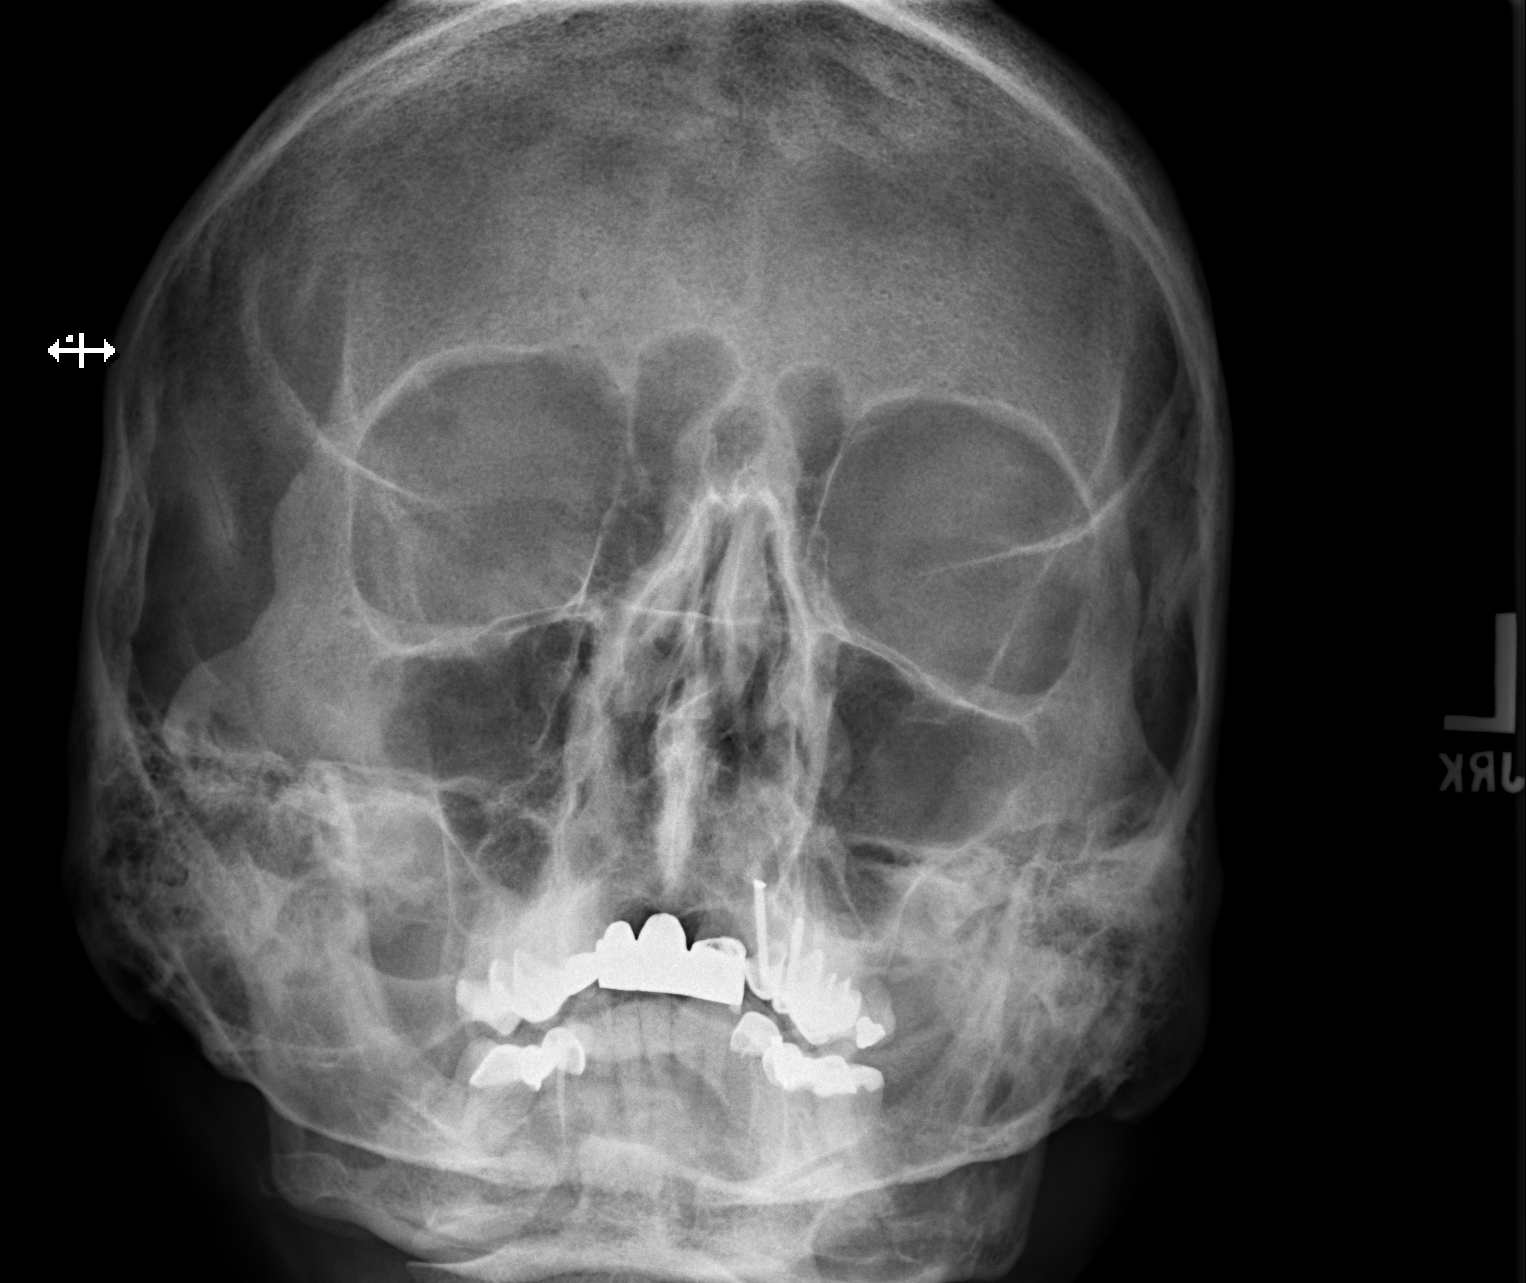

[w orbit pa (2 of 2)]
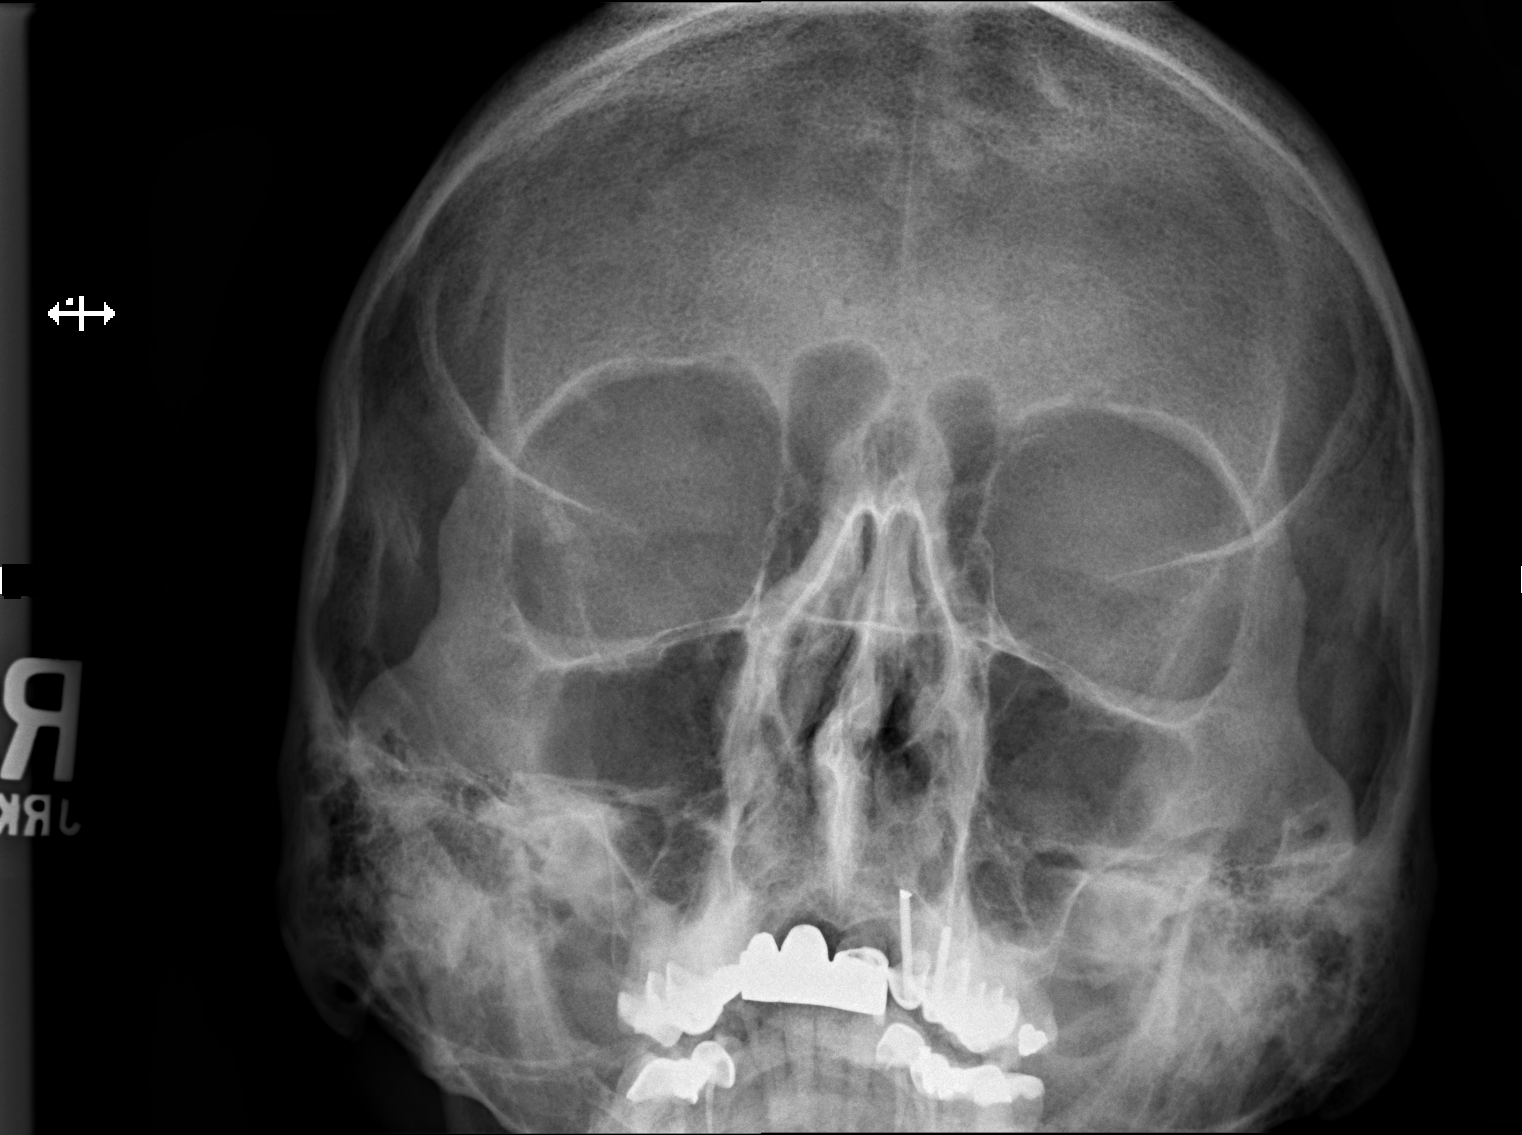

[2 of 2 positions shown; findings below may reference images not displayed]

FINDINGS: There is no evidence of metallic foreign body within the orbits. No
significant bone abnormality identified.
IMPRESSION: No evidence of metallic foreign body within the orbits.

## 2017-05-02 ENCOUNTER — Ambulatory Visit (HOSPITAL_BASED_OUTPATIENT_CLINIC_OR_DEPARTMENT_OTHER)
Admission: RE | Admit: 2017-05-02 | Discharge: 2017-05-02 | Disposition: A | Discharge status: Home / Self Care | Payer: Medicare HMO | Source: Ambulatory Visit | Attending: Family Medicine | Admitting: Family Medicine

## 2017-05-02 DIAGNOSIS — M25551 Pain in right hip: Secondary | ICD-10-CM

## 2017-05-02 DIAGNOSIS — M1611 Unilateral primary osteoarthritis, right hip: Secondary | ICD-10-CM | POA: Diagnosis not present

## 2017-05-02 DIAGNOSIS — M5431 Sciatica, right side: Secondary | ICD-10-CM | POA: Diagnosis not present

## 2017-05-03 ENCOUNTER — Encounter: Payer: Self-pay | Admitting: Family Medicine

## 2017-05-03 ENCOUNTER — Ambulatory Visit: Payer: Medicare HMO | Admitting: Sports Medicine

## 2017-05-03 ENCOUNTER — Ambulatory Visit (INDEPENDENT_AMBULATORY_CARE_PROVIDER_SITE_OTHER): Payer: Medicare HMO | Admitting: Family Medicine

## 2017-05-03 VITALS — BP 138/74 | HR 72 | Temp 98.7°F | Ht 70.0 in | Wt 208.0 lb

## 2017-05-03 DIAGNOSIS — M5431 Sciatica, right side: Secondary | ICD-10-CM | POA: Diagnosis not present

## 2017-05-03 MED ORDER — MELOXICAM 15 MG PO TABS: 15.0000 mg | ORAL_TABLET | Freq: Every day | ORAL | 0 refills | Status: DC

## 2017-05-03 MED ORDER — GABAPENTIN 300 MG PO CAPS: 300.0000 mg | ORAL_CAPSULE | Freq: Three times a day (TID) | ORAL | 3 refills | Status: DC

## 2017-05-03 NOTE — Patient Instructions (Signed)
Thank you for coming in,   Please start the gabapentin once a day and then you can increase it to 3 times a day as you tolerated.  Please try the stretches and exercises when the pain is less than 2 out of 10.  Please do not take the meloxicam and ibuprofen together.  Please follow-up with me in 2-3 weeks if this does not seem to be getting any improvement.   Please feel free to call with any questions or concerns at any time, at [phone]. --Dr. Raeford Razor

## 2017-05-03 NOTE — Progress Notes (Signed)
Matthew Ramirez - 76 y.o. male MRN [id number]  Date of birth: 13-Apr-1941  SUBJECTIVE:  Including CC & ROS.  Chief Complaint  Patient presents with  . Hip Pain    Patient states his right hip-pain extends from hip down to knee-it has been hurting for a month-denies injury. He states he has been to the chiropractor and he has seen a physical therapist in [place].    [name] is a 76 year old male is presenting with right-sided buttock pain that has some radicular symptoms down the lateral aspect of his right leg. This pain stops to just below his knee. He was working on his lawnmower initially lying on his right side when he felt the pain initially. They're felt like there is something digging into his buttock. The pain is moderate to severe and is worse in the morning when he tries getting out of bed. He has to stretch by crossing his right leg over his left and bending 4 to relieve the pain. The pain is worse with prolonged standing. Sitting down seems to alleviate the pain. Ibuprofen seems to control pain as well. Has tried Flexeril which helps him sleep at night.   He has been seen for sciatica on 10/19. He was provided a prednisone taper on 10/4 and referred to physical therapy. He also is taking Flexeril and Vicodin as needed. He has been using anti-inflammatories on a regular basis.   Independent review of a right hip x-ray from 10/22 shows mild osteoarthritic change of the glenohumeral joint.  Independent review of the lumbar films from 01/21/17 shows some loss of lordosis and facet arthritis in the lumbar spine.  Review of his A1c from April 2017 shows 7.0  Review of Systems  Musculoskeletal: Positive for myalgias. Negative for gait problem.  Skin: Negative for color change.  Neurological: Negative for weakness and numbness.    HISTORY: Past Medical, Surgical, Social, and Family History Reviewed & Updated per EMR.   Pertinent Historical Findings include:  Past Medical  History:  Diagnosis Date  . Asthma    as child-no mdi use now  . Bifascicular block 2017  . 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  Stable cysts 03/31/17 MRI.  . DM (diabetes mellitus) ([place]) 05/2011  . 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 External hemorrhoids   . Hearing impairment    Occupational noise damage x years.  Has hearing aids AU.  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  Marland Kitchen History of colon polyps 2010  . HTN (hypertension), benign   . Hyperlipidemia 03/2017   Recommended statin 03/2017; pt declined (he has done this multiple times)  . Liver mass 11/2015   MRI abd equivocal regarding benign/malig--recommended f/u liver u/s with doppler in 3 mo (u/s ordered by Dr. Carlean Purl already)  . 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 Personal history of colonic adenomas 2009; 09/14/2012   Polypectomy 2010 and 2014.  Marland Kitchen Plantar fasciitis    deep tissue laser tx at Dr. Emily Filbert (chiro) helped A LOT!  Marland Kitchen Polycystic kidney disease    Normal renal function as of 11/2015.  Stable on imaging 09/2016--recommended repeat in 6 mo.  Stable bilat cysts on MRI 03/31/17  . Tachycardia 2017   Echo and event monitor negative.  No recurrence as of cardiology f/u 02/2016.     Past Surgical History:  Procedure Laterality  Date  . CIRCUMCISION  age 67  . COLONOSCOPY W/ POLYPECTOMY  2009; 2014   09/2012: tubular adenoma--no high grade dysplasia.  Repeat 09/2017 approx.  Diverticulosis and small int hem  . DENTAL SURGERY     10+ root canals, several bridges  . EYE SURGERY     Right (s/p traumatic injury to eye)  . TRANSTHORACIC ECHOCARDIOGRAM  07/16/08; 11/2015   EF 55-60%, mild LVH and RVH, impaired relaxation.  2017 moderate LVH, EF 65-70%, grade I DD.  Marland Kitchen US AORTA: SCREENING  11/2015   No aneurism but "ectatic" portion of aorta was noted; repeat u/s in 5 yrs recommended    No  Known Allergies  Family History  Problem Relation Age of Onset  . Cancer Mother        leukemia  . Diabetes Mother   . Cancer Sister        ovarian  . Emphysema Father        smoker  . Diabetes Sister   . Colon cancer Neg Hx   . Rectal cancer Neg Hx   . Stomach cancer Neg Hx   . Pancreatic cancer Neg Hx   . Prostate cancer Neg Hx   . Esophageal cancer Neg Hx   . Liver cancer Neg Hx   . Kidney disease Neg Hx   . Liver disease Neg Hx      Social History   Social History  . Marital status: Married    Spouse name: N/A  . Number of children: 4  . Years of education: N/A   Occupational History  . Chief Financial Officer    Social History Main Topics  . Smoking status: Former Smoker    Packs/day: 1.00    Years: 20.00    Types: Cigarettes    Quit date: 07/12/1968  . Smokeless tobacco: Never Used  . Alcohol use 0.0 oz/week     Comment: a couple of drinks a week  . Drug use: No  . Sexual activity: Not on file   Other Topics Concern  . Not on file   Social History Narrative   Married, has 4 grown daughters, 15 grandchildren, and 1 great grandchild.   Occupation: Chief Financial Officer.  Originally from [facility], has lived in [place] since age 17 yrs.   Distant tobacco abuse: quit 1970.  Rare ETOH intake, no hx of ETOH abuse.  No drug use.   Walks about 4 miles per day.     PHYSICAL EXAM:  VS: BP 138/74 (BP Location: Left Arm, Patient Position: Sitting, Cuff Size: Normal)   Pulse 72   Temp 98.7 F (37.1 C) (Oral)   Ht 5\' 10"  (1.778 m)   Wt 208 lb (94.3 kg)   SpO2 98%   BMI 29.84 kg/m  Physical Exam Gen: NAD, alert, cooperative with exam, well-appearing ENT: normal lips, normal nasal mucosa,  Eye: normal EOM, normal conjunctiva and lids CV:  no edema, +2 pedal pulses   Resp: no accessory muscle use, non-labored,  Skin: no rashes, no areas of induration  Neuro: normal tone, normal sensation to touch Psych:  normal insight, alert and oriented MSK:   Back: Some tenderness to palpation of the piriformis. No tenderness to palpation of the greater trochanter, lumbar midline spine, or paraspinal muscles. Normal strength with hip flexion to resistance. Normal knee flexion and extension strength resistance. Normal deep tendon reflexes at the patella bilaterally. Negative straight leg raise bilaterally. Normal FADIR and FABER.  No significant weakness with hip abduction on  the right. 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 Does not appear to have any component of SI joint dysfunction. Possible for nerve root impingement as he does have some facet arthritis but does not have any exacerbation of symptoms on straight leg raise. Has tried a Restaurant manager, fast food and medicines with limited improvement. Has been occurring for 4 weeks or more. Was worsened with physical therapy. - Initiate gabapentin today, switch from ibuprofen to meloxicam. - Counseled on home exercise therapy - If no improvement then advised to follow-up sooner around 2 weeks. Can consider an MRI of his lumbar spine versus nerve conduction study. If this is more piriformis could consider a injection

## 2017-05-03 NOTE — Assessment & Plan Note (Addendum)
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 Does not appear to have any component of SI joint dysfunction. Possible for nerve root impingement as he does have some facet arthritis but does not have any exacerbation of symptoms on straight leg raise. Has tried a Restaurant manager, fast food and medicines with limited improvement. Has been occurring for 4 weeks or more. Was worsened with physical therapy. - Initiate gabapentin today, switch from ibuprofen to meloxicam. - Counseled on home exercise therapy - If no improvement then advised to follow-up sooner around 2 weeks. Can consider an MRI of his lumbar spine versus nerve conduction study. If this is more piriformis could consider a injection

## 2017-05-05 ENCOUNTER — Telehealth: Payer: Self-pay | Admitting: Family Medicine

## 2017-05-05 MED ORDER — HYDROCODONE-ACETAMINOPHEN 10-325 MG PO TABS: ORAL_TABLET | ORAL | 0 refills | Status: DC

## 2017-05-05 NOTE — Telephone Encounter (Signed)
Patient states he is experiencing severe hip and leg pain. He was referred to sports medicine and was seen by Dr. Tamala Julian, who prescribed meloxicam (MOBIC) 15 MG tablet & gabapentin (NEURONTIN) 300 MG capsule.  However, he has not had any relief.  He is unable to sleep at night due to having to sit in chair because of the pain.  He is requesting something for pain relief.

## 2017-05-05 NOTE — Telephone Encounter (Signed)
Printed vicodin 10/325 rx.

## 2017-05-05 NOTE — Telephone Encounter (Signed)
Please advise. Thanks.  

## 2017-05-05 NOTE — Telephone Encounter (Signed)
Pt advised and voiced understanding.  Rx put up front for p/u.  

## 2017-05-18 ENCOUNTER — Other Ambulatory Visit: Payer: Self-pay | Admitting: *Deleted

## 2017-05-18 DIAGNOSIS — R69 Illness, unspecified: Secondary | ICD-10-CM | POA: Diagnosis not present

## 2017-05-18 MED ORDER — GLUCOSE BLOOD VI STRP: ORAL_STRIP | 11 refills | Status: DC

## 2017-05-26 ENCOUNTER — Other Ambulatory Visit: Payer: Self-pay | Admitting: Family Medicine

## 2017-05-30 ENCOUNTER — Other Ambulatory Visit: Payer: Self-pay | Admitting: *Deleted

## 2017-05-30 MED ORDER — MELOXICAM 15 MG PO TABS: ORAL_TABLET | ORAL | 3 refills | Status: DC

## 2017-05-30 NOTE — Telephone Encounter (Signed)
[facility]  RF request for meloxicam LOV: 04/29/17 Next ov: 09/06/17 Last written: 05/03/17 #30 w/ 0RF Rx'ed by Dr. Clearance Coots  Please advise. Thanks.

## 2017-05-30 NOTE — Telephone Encounter (Signed)
Pt has CPE scheduled for 09/06/17 at 8:15am.

## 2017-06-27 ENCOUNTER — Other Ambulatory Visit: Payer: Self-pay | Admitting: Endocrinology

## 2017-06-29 ENCOUNTER — Other Ambulatory Visit: Payer: Self-pay

## 2017-07-01 ENCOUNTER — Other Ambulatory Visit: Payer: Self-pay

## 2017-07-01 MED ORDER — METFORMIN HCL 1000 MG PO TABS: 1000.0000 mg | ORAL_TABLET | Freq: Two times a day (BID) | ORAL | 0 refills | Status: DC

## 2017-07-03 DIAGNOSIS — R69 Illness, unspecified: Secondary | ICD-10-CM | POA: Diagnosis not present

## 2017-07-06 ENCOUNTER — Other Ambulatory Visit: Payer: Self-pay

## 2017-07-06 MED ORDER — PIOGLITAZONE HCL 45 MG PO TABS: ORAL_TABLET | ORAL | 2 refills | Status: DC

## 2017-08-20 DIAGNOSIS — R69 Illness, unspecified: Secondary | ICD-10-CM | POA: Diagnosis not present

## 2017-08-21 ENCOUNTER — Other Ambulatory Visit: Payer: Self-pay | Admitting: Family Medicine

## 2017-09-06 ENCOUNTER — Encounter: Payer: Medicare HMO | Admitting: Family Medicine

## 2017-09-09 IMAGING — DX DG CHEST 1V PORT
1 series · 1 of 1 positions shown · non-contrast
Comparison: None.

CLINICAL DATA: Shortness of breath.  Lightheadedness.  Ex-smoker.

EXAM:
PORTABLE CHEST 1 VIEW

[chest ap]
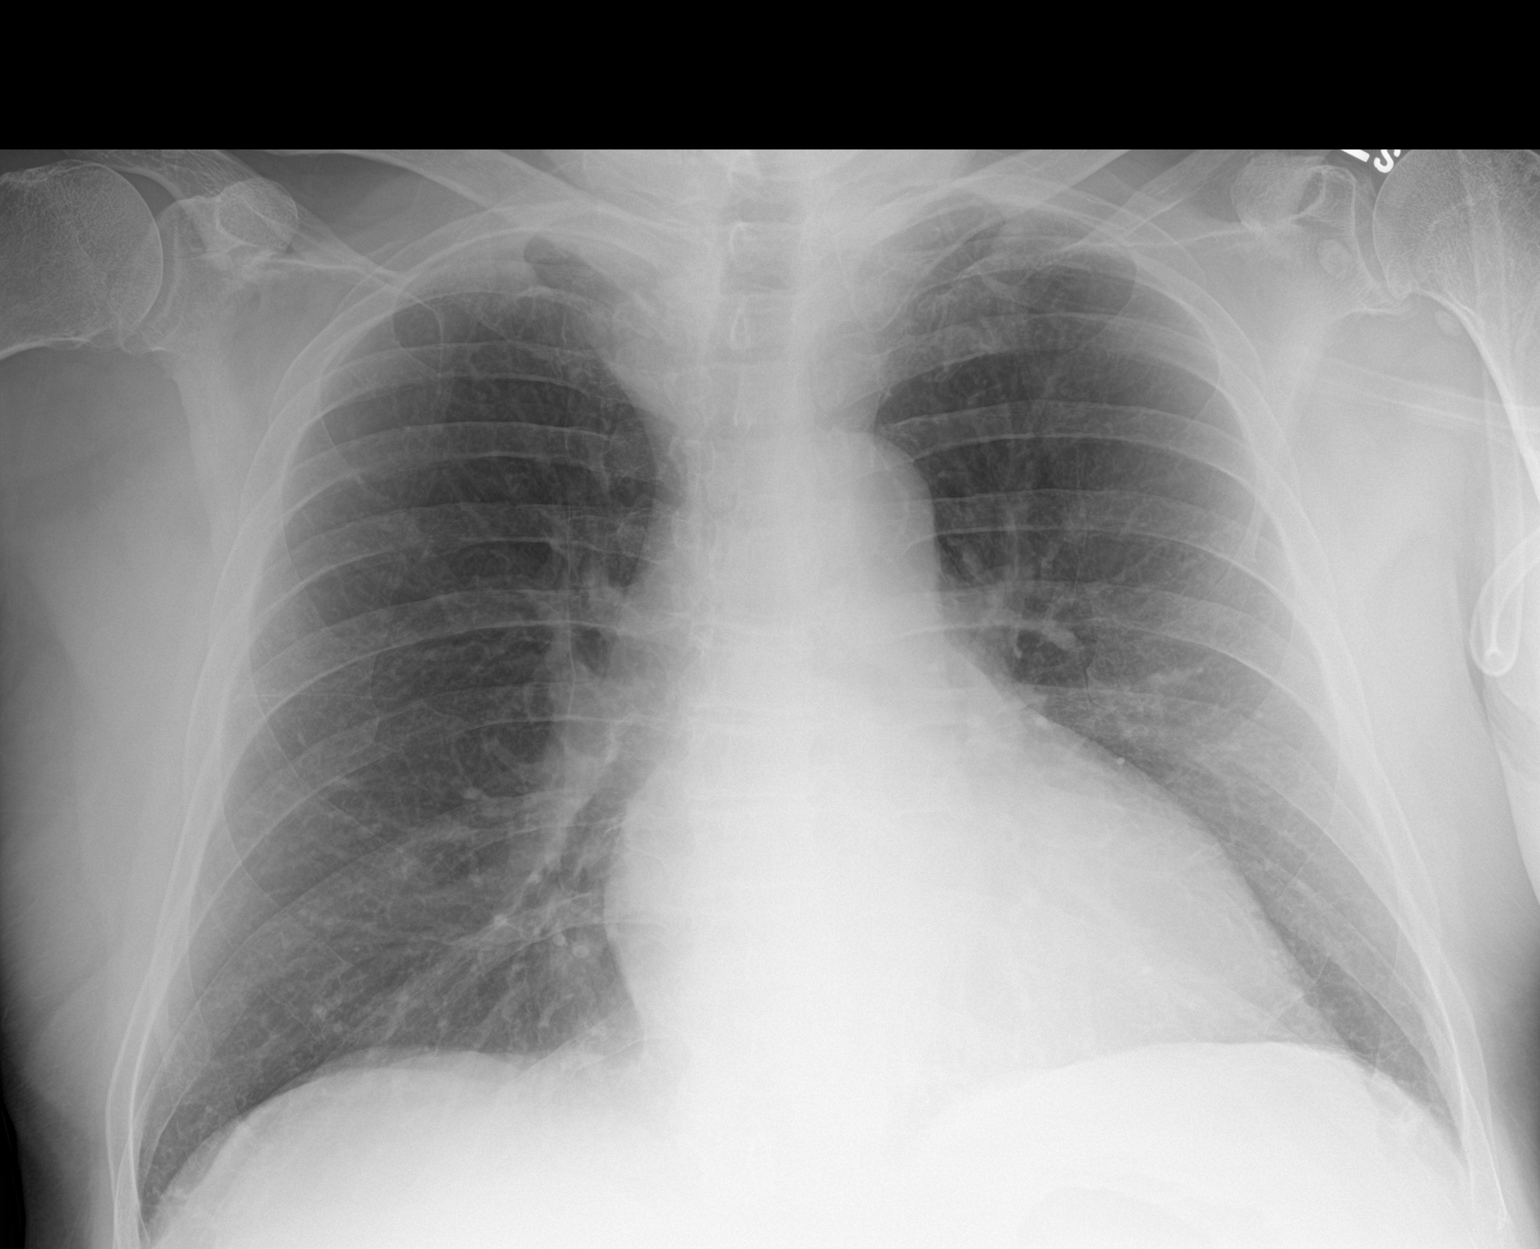

[1 of 1 positions shown; findings below may reference images not displayed]

FINDINGS: Mildly enlarged cardiac silhouette. Mildly tortuous and calcified
thoracic aorta. Clear lungs with normal vascularity. Mild left
shoulder degenerative changes. Diffuse osteopenia.
IMPRESSION: 1. No acute abnormality.
2. Mild cardiomegaly.
3. Aortic atherosclerosis.

## 2017-09-22 ENCOUNTER — Encounter: Payer: Self-pay | Admitting: Internal Medicine

## 2017-09-29 ENCOUNTER — Other Ambulatory Visit: Payer: Self-pay | Admitting: Endocrinology

## 2017-10-03 ENCOUNTER — Ambulatory Visit (INDEPENDENT_AMBULATORY_CARE_PROVIDER_SITE_OTHER): Payer: Medicare HMO | Admitting: Family Medicine

## 2017-10-03 ENCOUNTER — Encounter: Payer: Self-pay | Admitting: Family Medicine

## 2017-10-03 VITALS — BP 125/67 | HR 56 | Temp 97.9°F | Resp 16 | Ht 68.5 in | Wt 206.1 lb

## 2017-10-03 DIAGNOSIS — Z0000 Encounter for general adult medical examination without abnormal findings: Secondary | ICD-10-CM | POA: Diagnosis not present

## 2017-10-03 DIAGNOSIS — E119 Type 2 diabetes mellitus without complications: Secondary | ICD-10-CM | POA: Diagnosis not present

## 2017-10-03 DIAGNOSIS — I10 Essential (primary) hypertension: Secondary | ICD-10-CM | POA: Diagnosis not present

## 2017-10-03 NOTE — Patient Instructions (Signed)

## 2017-10-03 NOTE — Progress Notes (Signed)
Office Note 10/03/2017  CC:  Chief Complaint  Patient presents with  . Annual Exam    Pt is fasting.     HPI:  Matthew Ramirez is a 77 y.o. White male who is here for annual health maintenance exam.  Most recent MD to follow/manage his DM is Dr. Loanne Drilling but pt has not followed up with him in over a year. It was pt's understanding that Dr. Cordelia Pen office would call him if f/u needed (??). Pt states glucoses avg 150 (sounds like he checks infrequently) the last 6 mo or so. He does not exercise regularly but is active, able to split and stack wood.   Past Medical History:  Diagnosis Date  . Asthma    as child-no mdi use now  . Bifascicular block 2017  . 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  Stable cysts 03/31/17 MRI.  . DM (diabetes mellitus) ([place]) 05/2011  . 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 External hemorrhoids   . Hearing impairment    Occupational noise damage x years.  Has hearing aids AU.  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  Marland Kitchen History of colon polyps 2010  . HTN (hypertension), benign   . Hyperlipidemia 03/2017   Recommended statin 03/2017; pt declined (he has done this multiple times)  . Liver mass 11/2015   MRI abd equivocal regarding benign/malig--recommended f/u liver u/s with doppler in 3 mo (u/s ordered by Dr. Carlean Purl already)  . 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 Personal history of colonic adenomas 2009; 09/14/2012   Polypectomy 2010 and 2014.  Marland Kitchen Plantar fasciitis    deep tissue laser tx at Dr. Emily Filbert (chiro) helped A LOT!  Marland Kitchen Polycystic kidney disease    Normal renal function as of 11/2015.  Stable on imaging 09/2016--recommended repeat in 6 mo.  Stable bilat cysts on MRI 03/31/17  . 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  . Tachycardia 2017    Echo and event monitor negative.  No recurrence as of cardiology f/u 02/2016.     Past Surgical History:  Procedure Laterality Date  . CIRCUMCISION  age 33  . COLONOSCOPY W/ POLYPECTOMY  2009; 2014   09/2012: tubular adenoma--no high grade dysplasia.  Repeat 09/2017 approx.  Diverticulosis and small int hem  . DENTAL SURGERY     10+ root canals, several bridges  . EYE SURGERY     Right (s/p traumatic injury to eye)  . TRANSTHORACIC ECHOCARDIOGRAM  07/16/08; 11/2015   EF 55-60%, mild LVH and RVH, impaired relaxation.  2017 moderate LVH, EF 65-70%, grade I DD.  Marland Kitchen US AORTA: SCREENING  11/2015   No aneurism but "ectatic" portion of aorta was noted; repeat u/s in 5 yrs recommended    Family History  Problem Relation Age of Onset  . Cancer Mother        leukemia  . Diabetes Mother   . Cancer Sister        ovarian  . Emphysema Father        smoker  . Diabetes Sister   . Colon cancer Neg Hx   . Rectal cancer Neg Hx   . Stomach cancer Neg Hx   . Pancreatic cancer Neg Hx   . Prostate cancer Neg Hx   . Esophageal cancer Neg Hx   . Liver cancer  Neg Hx   . Kidney disease Neg Hx   . Liver disease Neg Hx     Social History   Socioeconomic History  . Marital status: Married    Spouse name: Not on file  . Number of children: 4  . Years of education: Not on file  . Highest education level: Not on file  Occupational History  . Occupation: Chief Financial Officer  Social Needs  . Financial resource strain: Not on file  . Food insecurity:    Worry: Not on file    Inability: Not on file  . Transportation needs:    Medical: Not on file    Non-medical: Not on file  Tobacco Use  . Smoking status: Former Smoker    Packs/day: 1.00    Years: 20.00    Pack years: 20.00    Types: Cigarettes    Last attempt to quit: 07/12/1968    Years since quitting: 49.2  . Smokeless tobacco: Never Used  Substance and Sexual Activity  . Alcohol use: Yes    Alcohol/week: 0.0 oz    Comment: a couple of  drinks a week  . Drug use: No  . Sexual activity: Not on file  Lifestyle  . Physical activity:    Days per week: Not on file    Minutes per session: Not on file  . Stress: Not on file  Relationships  . Social connections:    Talks on phone: Not on file    Gets together: Not on file    Attends religious service: Not on file    Active member of club or organization: Not on file    Attends meetings of clubs or organizations: Not on file    Relationship status: Not on file  . Intimate partner violence:    Fear of current or ex partner: Not on file    Emotionally abused: Not on file    Physically abused: Not on file    Forced sexual activity: Not on file  Other Topics Concern  . Not on file  Social History Narrative   Married, has 4 grown daughters, 15 grandchildren, and 1 great grandchild.   Occupation: Chief Financial Officer.  Originally from [facility], has lived in [place] since age 38 yrs.   Distant tobacco abuse: quit 1970.  Rare ETOH intake, no hx of ETOH abuse.  No drug use.   Walks about 4 miles per day.    Outpatient Medications Prior to Visit  Medication Sig Dispense Refill  . B Complex-C (SUPER B COMPLEX PO) Take 2 tablets by mouth daily.    . Blood Glucose Monitoring Suppl (ONE TOUCH ULTRA SYSTEM KIT) W/DEVICE KIT 1 kit by Does not apply route once. 100 each 3  . CINNAMON PO Take 1 capsule by mouth daily.    Marland Kitchen glucose blood (ONE TOUCH ULTRA TEST) test strip USE AS DIRECTED TO TEST BLOOD GLUCE TWICE DAILY 100 each 11  . ibuprofen (ADVIL,MOTRIN) 200 MG tablet Take 400 mg by mouth every 3 (three) hours as needed.    Marland Kitchen lisinopril (PRINIVIL,ZESTRIL) 5 MG tablet TAKE 1 TABLET BY MOUTH EVERY DAY 90 tablet 0  . magnesium 30 MG tablet Take 30 mg by mouth daily.    . metFORMIN (GLUCOPHAGE) 1000 MG tablet TAKE 1 TABLET BY MOUTH TWICE A DAY WITH MEALS 180 tablet 0  . nystatin-triamcinolone ointment (MYCOLOG) Apply topically 2 (two) times daily. 30 g 2  . ONETOUCH DELICA  LANCETS MISC Check glucose twice daily 60 each 5  .  pioglitazone (ACTOS) 45 MG tablet TAKE 1 TABLET(45 MG) BY MOUTH DAILY 90 tablet 2  . senna (SENOKOT) 8.6 MG TABS Take 2 tablets by mouth daily.     . cyclobenzaprine (FLEXERIL) 10 MG tablet Take 1 tablet (10 mg total) by mouth 3 (three) times daily as needed for muscle spasms. (Patient not taking: Reported on 10/03/2017) 30 tablet 1  . gabapentin (NEURONTIN) 300 MG capsule Take 1 capsule (300 mg total) by mouth 3 (three) times daily. (Patient not taking: Reported on 10/03/2017) 90 capsule 3  . HYDROcodone-acetaminophen (NORCO) 10-325 MG tablet 1 tab po q6h prn pain (Patient not taking: Reported on 10/03/2017) 30 tablet 0  . meloxicam (MOBIC) 15 MG tablet 1 tab po qd prn for musculoskeletal pain (Patient not taking: Reported on 10/03/2017) 30 tablet 3  . POTASSIUM PO Take 1 tablet by mouth daily.      No facility-administered medications prior to visit.     No Known Allergies  ROS Review of Systems  Constitutional: Negative for appetite change, chills, fatigue and fever.  HENT: Negative for congestion, dental problem, ear pain and sore throat.   Eyes: Negative for discharge, redness and visual disturbance.  Respiratory: Negative for cough, chest tightness, shortness of breath and wheezing.   Cardiovascular: Negative for chest pain, palpitations and leg swelling.  Gastrointestinal: Negative for abdominal pain, blood in stool, diarrhea, nausea and vomiting.  Genitourinary: Negative for difficulty urinating, dysuria, flank pain, frequency, hematuria and urgency.  Musculoskeletal: Negative for arthralgias, back pain, joint swelling, myalgias and neck stiffness.  Skin: Negative for pallor and rash.  Neurological: Negative for dizziness, speech difficulty, weakness and headaches.  Hematological: Negative for adenopathy. Does not bruise/bleed easily.  Psychiatric/Behavioral: Negative for confusion and sleep disturbance. The patient is not  nervous/anxious.    PE; Blood pressure 125/67, pulse (!) 56, temperature 97.9 F (36.6 C), temperature source Oral, resp. rate 16, height 5' 8.5" (1.74 m), weight 206 lb 2 oz (93.5 kg), SpO2 93 %. Gen: Alert, well appearing.  Patient is oriented to person, place, time, and situation. AFFECT: pleasant, lucid thought and speech. ENT: Ears: EACs clear, normal epithelium.  TMs with good light reflex and landmarks bilaterally.  Eyes: no injection, icteris, swelling, or exudate.  EOMI, R pupil oblong shaped, nonreactive (chronic, s/p eye surgery), RR +.  L pupil small, , +RR, reactive to light. Nose: no drainage or turbinate edema/swelling.  No injection or focal lesion.  Mouth: lips without lesion/swelling.  Oral mucosa pink and moist.  Dentition intact and without obvious caries or gingival swelling.  Oropharynx without erythema, exudate, or swelling.  Neck: supple/nontender.  No LAD, mass, or TM.  Carotid pulses 2+ bilaterally, without bruits. CV: RRR, no m/r/g.   LUNGS: CTA bilat, nonlabored resps, good aeration in all lung fields. ABD: soft, NT, ND, BS normal.  No hepatospenomegaly or mass.  No bruits. EXT: no clubbing or cyanosis.  He has 2+ pitting edema bilat in LLs. Musculoskeletal: no joint swelling, erythema, warmth, or tenderness.  ROM of all joints intact. Skin - no sores or suspicious lesions or rashes or color changes  Pertinent labs:  Lab Results  Component Value Date   TSH 2.45 09/23/2014   Lab Results  Component Value Date   WBC 12.6 (H) 04/15/2016   HGB 13.6 04/15/2016   HCT 40.0 04/15/2016   MCV 93.7 04/15/2016   PLT 192 04/15/2016   Lab Results  Component Value Date   CREATININE 0.90 03/31/2017   BUN 24 (H)  04/15/2016   NA 140 04/15/2016   K 4.1 04/15/2016   CL 107 04/15/2016   CO2 27 04/15/2016   Lab Results  Component Value Date   ALT 16 12/23/2015   AST 17 12/23/2015   ALKPHOS 63 12/23/2015   BILITOT 0.6 12/23/2015   Lab Results  Component Value Date    CHOL 225 (H) 03/28/2017   Lab Results  Component Value Date   HDL 58.20 03/28/2017   Lab Results  Component Value Date   LDLCALC 133 (H) 03/28/2017   Lab Results  Component Value Date   TRIG 169.0 (H) 03/28/2017   Lab Results  Component Value Date   CHOLHDL 4 03/28/2017   Lab Results  Component Value Date   PSA 0.55 05/11/2011   Lab Results  Component Value Date   HGBA1C 7.0 10/13/2015    ASSESSMENT AND PLAN:   Health maintenance exam: Reviewed age and gender appropriate health maintenance issues (prudent diet, regular exercise, health risks of tobacco and excessive alcohol, use of seatbelts, fire alarms in home, use of sunscreen).  Also reviewed age and gender appropriate health screening as well as vaccine recommendations. Vaccines:prevnar 13--pt has consistently declined this and pneumovax.  He declined flu vaccine today.   Shingrix discussed--he does not want this vaccine. Labs: CBC, CMET, FLP, A1c (DM 2, pt noncompliant with f/u---it still was not clear from our conversation today whether he was going to still pursue approp f/u with endo or switch back to me for ongoing mgmt). Prostate ca screening: discussed decreased utility of ongoing prostate ca screening in men after age 22: he chose to stop screening at this time. Colon ca screening: next colonoscopy would be due this month (last one was 09/2012)--but pt states that his GI MD told him at the time of the last colonoscopy that he did NOT need any further screening colonoscopies.  Question of accuracy of this recollection per pt, so he'll schedule appt with his GI MD to discuss.  An After Visit Summary was printed and given to the patient.  FOLLOW UP:  Return in about 6 months (around 04/05/2018) for routine chronic illness f/u.  Signed:  [name], MD           10/03/2017

## 2017-10-04 LAB — CBC WITH DIFFERENTIAL/PLATELET
BASOS PCT: 0.4 %
Basophils Absolute: 38 cells/uL (ref 0–200)
EOS ABS: 221 {cells}/uL (ref 15–500)
EOS PCT: 2.3 %
HEMATOCRIT: 42.4 % (ref 38.5–50.0)
HEMOGLOBIN: 14.6 g/dL (ref 13.2–17.1)
Lymphs Abs: 1584 cells/uL (ref 850–3900)
MCH: 30.4 pg (ref 27.0–33.0)
MCHC: 34.4 g/dL (ref 32.0–36.0)
MCV: 88.3 fL (ref 80.0–100.0)
MONOS PCT: 7.3 %
MPV: 10.8 fL (ref 7.5–12.5)
NEUTROS ABS: 7056 {cells}/uL (ref 1500–7800)
Neutrophils Relative %: 73.5 %
Platelets: 202 10*3/uL (ref 140–400)
RBC: 4.8 10*6/uL (ref 4.20–5.80)
RDW: 13.3 % (ref 11.0–15.0)
Total Lymphocyte: 16.5 %
WBC mixed population: 701 cells/uL (ref 200–950)
WBC: 9.6 10*3/uL (ref 3.8–10.8)

## 2017-10-04 LAB — COMPREHENSIVE METABOLIC PANEL
AG Ratio: 1.6 (calc) (ref 1.0–2.5)
ALT: 15 U/L (ref 9–46)
AST: 16 U/L (ref 10–35)
Albumin: 4.4 g/dL (ref 3.6–5.1)
Alkaline phosphatase (APISO): 95 U/L (ref 40–115)
BILIRUBIN TOTAL: 0.6 mg/dL (ref 0.2–1.2)
BUN/Creatinine Ratio: 20 (calc) (ref 6–22)
BUN: 13 mg/dL (ref 7–25)
CALCIUM: 9.2 mg/dL (ref 8.6–10.3)
CO2: 27 mmol/L (ref 20–32)
Chloride: 102 mmol/L (ref 98–110)
Creat: 0.66 mg/dL — ABNORMAL LOW (ref 0.70–1.18)
Globulin: 2.8 g/dL (calc) (ref 1.9–3.7)
Glucose, Bld: 135 mg/dL — ABNORMAL HIGH (ref 65–99)
POTASSIUM: 4.4 mmol/L (ref 3.5–5.3)
Sodium: 138 mmol/L (ref 135–146)
Total Protein: 7.2 g/dL (ref 6.1–8.1)

## 2017-10-04 LAB — LIPID PANEL
CHOL/HDL RATIO: 4.1 (calc) (ref <5.0)
CHOLESTEROL: 200 mg/dL — AB (ref <200)
HDL: 49 mg/dL (ref >40)
LDL CHOLESTEROL (CALC): 120 mg/dL — AB
Non-HDL Cholesterol (Calc): 151 mg/dL (calc) — ABNORMAL HIGH (ref <130)
TRIGLYCERIDES: 191 mg/dL — AB (ref <150)

## 2017-10-04 LAB — HEMOGLOBIN A1C
Hgb A1c MFr Bld: 6.9 % of total Hgb — ABNORMAL HIGH (ref <5.7)
MEAN PLASMA GLUCOSE: 151 (calc)
eAG (mmol/L): 8.4 (calc)

## 2017-10-05 ENCOUNTER — Encounter: Payer: Self-pay | Admitting: Family Medicine

## 2017-10-08 DIAGNOSIS — R69 Illness, unspecified: Secondary | ICD-10-CM | POA: Diagnosis not present

## 2017-10-31 DIAGNOSIS — R69 Illness, unspecified: Secondary | ICD-10-CM | POA: Diagnosis not present

## 2017-11-15 ENCOUNTER — Other Ambulatory Visit: Payer: Self-pay | Admitting: Family Medicine

## 2017-11-28 ENCOUNTER — Encounter: Payer: Self-pay | Admitting: Family Medicine

## 2017-11-28 DIAGNOSIS — Z961 Presence of intraocular lens: Secondary | ICD-10-CM | POA: Diagnosis not present

## 2017-11-28 DIAGNOSIS — E119 Type 2 diabetes mellitus without complications: Secondary | ICD-10-CM | POA: Diagnosis not present

## 2017-11-28 DIAGNOSIS — H04123 Dry eye syndrome of bilateral lacrimal glands: Secondary | ICD-10-CM | POA: Diagnosis not present

## 2017-11-28 DIAGNOSIS — H4322 Crystalline deposits in vitreous body, left eye: Secondary | ICD-10-CM | POA: Diagnosis not present

## 2017-11-28 DIAGNOSIS — H524 Presbyopia: Secondary | ICD-10-CM | POA: Diagnosis not present

## 2017-11-28 DIAGNOSIS — H2512 Age-related nuclear cataract, left eye: Secondary | ICD-10-CM | POA: Diagnosis not present

## 2017-11-28 DIAGNOSIS — H52203 Unspecified astigmatism, bilateral: Secondary | ICD-10-CM | POA: Diagnosis not present

## 2017-11-28 LAB — HM DIABETES EYE EXAM

## 2017-12-19 DIAGNOSIS — R69 Illness, unspecified: Secondary | ICD-10-CM | POA: Diagnosis not present

## 2017-12-21 ENCOUNTER — Other Ambulatory Visit: Payer: Self-pay | Admitting: Endocrinology

## 2018-02-10 ENCOUNTER — Telehealth: Payer: Self-pay

## 2018-02-10 NOTE — Telephone Encounter (Signed)
Returned phone call to patient, message left stating PCP is seeing patients and asked if we could take a message to relay to PCP.

## 2018-02-10 NOTE — Telephone Encounter (Signed)
Copied from [place] [id number]. Topic: Quick Communication - See Telephone Encounter >> Feb 10, 2018  8:51 AM Marja Kays F wrote: Pt is requesting a call back from Dr. Danne Baxter would not go into detail with me   Best number [phone]

## 2018-02-13 ENCOUNTER — Telehealth: Payer: Self-pay | Admitting: Endocrinology

## 2018-02-13 NOTE — Telephone Encounter (Signed)
Patient stated he is trying to get a prescription for Phentermine .     He would like to know if Dr Loanne Drilling thinks this would be a good option for him to help him loose weight. He stated he has tried dieting, adkins diet and fasting but nothing has helped him  Please advise

## 2018-02-13 NOTE — Telephone Encounter (Signed)
SW Dr. Anitra Lauth, he stated that he does not prescribe this medication for his pts.   Pt advised and voiced understanding.

## 2018-02-13 NOTE — Telephone Encounter (Signed)
Patient walked in asking Dr. Anitra Lauth to call him back personally [phone]. He has questions about Phentermine.

## 2018-02-14 NOTE — Telephone Encounter (Signed)
This is outside the scope of our practice.  I would be happy to refer to med weight loss mgmt.

## 2018-02-14 NOTE — Telephone Encounter (Signed)
Please advise 

## 2018-02-15 NOTE — Telephone Encounter (Signed)
I called patient & he stated that he would call us back to let us know for sure if that is something that he wanted to pursue.

## 2018-03-24 ENCOUNTER — Other Ambulatory Visit: Payer: Self-pay | Admitting: Endocrinology

## 2018-03-29 ENCOUNTER — Other Ambulatory Visit: Payer: Self-pay | Admitting: Endocrinology

## 2018-03-29 NOTE — Telephone Encounter (Signed)
Needs OV 1st not see since 2017/thx dmf

## 2018-04-03 ENCOUNTER — Telehealth: Payer: Self-pay | Admitting: Endocrinology

## 2018-04-03 ENCOUNTER — Ambulatory Visit (INDEPENDENT_AMBULATORY_CARE_PROVIDER_SITE_OTHER): Payer: Medicare HMO

## 2018-04-03 ENCOUNTER — Other Ambulatory Visit: Payer: Self-pay

## 2018-04-03 VITALS — BP 132/76 | HR 65 | Ht 69.0 in | Wt 205.0 lb

## 2018-04-03 DIAGNOSIS — E119 Type 2 diabetes mellitus without complications: Secondary | ICD-10-CM

## 2018-04-03 DIAGNOSIS — Z0000 Encounter for general adult medical examination without abnormal findings: Secondary | ICD-10-CM | POA: Diagnosis not present

## 2018-04-03 DIAGNOSIS — Z1211 Encounter for screening for malignant neoplasm of colon: Secondary | ICD-10-CM

## 2018-04-03 DIAGNOSIS — E669 Obesity, unspecified: Secondary | ICD-10-CM

## 2018-04-03 MED ORDER — PIOGLITAZONE HCL 45 MG PO TABS: ORAL_TABLET | ORAL | 2 refills | Status: DC

## 2018-04-03 MED ORDER — METFORMIN HCL 1000 MG PO TABS: 1000.0000 mg | ORAL_TABLET | Freq: Two times a day (BID) | ORAL | 0 refills | Status: DC

## 2018-04-03 NOTE — Progress Notes (Signed)
Subjective:   Matthew Ramirez is a 77 y.o. male who presents for Medicare Annual/Subsequent preventive examination.  Review of Systems:  No ROS.  Medicare Wellness Visit. Additional risk factors are reflected in the social history.  Cardiac Risk Factors include: advanced age (>20mn, >>43women);male gender;dyslipidemia;diabetes mellitus;hypertension;obesity (BMI >30kg/m2)   Sleep patterns: Sleeps 6-7 hours.  Home Safety/Smoke Alarms: Feels safe in home. Smoke alarms in place.  Living environment; residence and Firearm Safety: Lives with wife and family in 115story home with basement.  Seat Belt Safety/Bike Helmet: Wears seat belt.    Male:   CCS-Colonoscopy 10/03/2012, polyps. Recall 5 years. GI referral placed.       PSA-  Lab Results  Component Value Date   PSA 0.55 05/11/2011       Objective:    Vitals: BP 132/76 (BP Location: Left Arm, Patient Position: Sitting, Cuff Size: Normal)   Pulse 65   Ht 5' 9" (1.753 m)   Wt 205 lb (93 kg)   SpO2 96%   BMI 30.27 kg/m   Body mass index is 30.27 kg/m.  Advanced Directives 04/03/2018 03/28/2017 04/15/2016 06/12/2015  Does Patient Have a Medical Advance Directive? No No No No  Would patient like information on creating a medical advance directive? No - Patient declined No - Patient declined No - patient declined information No - patient declined information    Tobacco Social History   Tobacco Use  Smoking Status Former Smoker  . Packs/day: 1.00  . Years: 20.00  . Pack years: 20.00  . Types: Cigarettes  . Last attempt to quit: 07/12/1968  . Years since quitting: 49.7  Smokeless Tobacco Never Used     Counseling given: Not Answered   Past Medical History:  Diagnosis Date  . Asthma    as child-no mdi use now  . Bifascicular block 2017  . 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  Stable cysts 03/31/17 MRI.  . DM (diabetes mellitus) ([place] 05/2011  . Excessive  somnolence disorder 05/04/2011   PSG 04/2011 showed mild OSA  . External hemorrhoids   . Hearing impairment    Occupational noise damage x years.  Has hearing aids AU.  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  .Marland KitchenHistory of colon polyps 2010  . HTN (hypertension), benign   . Hyperlipidemia 03/2017   Recommended statin 03/2017; pt declined (he has done this multiple times, most recently 09/2017.)  . Liver mass 11/2015   MRI abd equivocal regarding benign/malig--recommended f/u liver u/s with doppler in 3 mo (u/s ordered by Dr. GCarlean Purlalready)  . 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 Personal history of colonic adenomas 2009; 09/14/2012   Polypectomy 2010 and 2014.  .Marland KitchenPlantar fasciitis    deep tissue laser tx at Dr. BEmily Filbert(chiro) helped A LOT!  .Marland KitchenPolycystic kidney disease    Normal renal function as of 11/2015.  Stable on imaging 09/2016--recommended repeat in 6 mo.  Stable bilat cysts on MRI 03/31/17  . 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  . Tachycardia 2017   Echo and event monitor negative.  No recurrence as of cardiology f/u 02/2016.    Past Surgical History:  Procedure Laterality Date  . CIRCUMCISION  age 77 . COLONOSCOPY W/ POLYPECTOMY  2009; 2014   09/2012: tubular adenoma--no high grade dysplasia.  Repeat 09/2017 approx.  Diverticulosis and small int hem  . DENTAL SURGERY     10+ root canals, several bridges  . EYE SURGERY     Right (s/p traumatic injury to eye)  . TRANSTHORACIC ECHOCARDIOGRAM  07/16/08; 11/2015   EF 55-60%, mild LVH and RVH, impaired relaxation.  2017 moderate LVH, EF 65-70%, grade I DD.  Marland Kitchen US AORTA: SCREENING  11/2015   No aneurism but "ectatic" portion of aorta was noted; repeat u/s in 5 yrs recommended   Family History  Problem Relation Age of Onset  . Cancer Mother        leukemia  . Diabetes Mother   . Cancer Sister        ovarian  .  Emphysema Father        smoker  . Diabetes Sister   . Kidney disease Brother   . Heart disease Brother   . Colon cancer Neg Hx   . Rectal cancer Neg Hx   . Stomach cancer Neg Hx   . Pancreatic cancer Neg Hx   . Prostate cancer Neg Hx   . Esophageal cancer Neg Hx   . Liver cancer Neg Hx   . Liver disease Neg Hx    Social History   Socioeconomic History  . Marital status: Married    Spouse name: Not on file  . Number of children: 4  . Years of education: Not on file  . Highest education level: Not on file  Occupational History  . Occupation: Chief Financial Officer  Social Needs  . Financial resource strain: Not on file  . Food insecurity:    Worry: Not on file    Inability: Not on file  . Transportation needs:    Medical: Not on file    Non-medical: Not on file  Tobacco Use  . Smoking status: Former Smoker    Packs/day: 1.00    Years: 20.00    Pack years: 20.00    Types: Cigarettes    Last attempt to quit: 07/12/1968    Years since quitting: 49.7  . Smokeless tobacco: Never Used  Substance and Sexual Activity  . Alcohol use: Yes    Alcohol/week: 0.0 standard drinks    Comment: a couple of drinks a week  . Drug use: No  . Sexual activity: Not on file  Lifestyle  . Physical activity:    Days per week: Not on file    Minutes per session: Not on file  . Stress: Not on file  Relationships  . Social connections:    Talks on phone: Not on file    Gets together: Not on file    Attends religious service: Not on file    Active member of club or organization: Not on file    Attends meetings of clubs or organizations: Not on file    Relationship status: Not on file  Other Topics Concern  . Not on file  Social History Narrative   Married, has 4 grown daughters, 15 grandchildren, and 1 great grandchild.   Occupation: Chief Financial Officer.  Originally from [facility] [place] [facility], has lived in [place] since age 31 yrs.   Distant tobacco abuse: quit 1970.  Rare ETOH intake,  no hx of ETOH abuse.  No drug use.   Walks about 4 miles per day.    Outpatient Encounter Medications as of 04/03/2018  Medication Sig  . B Complex-C (SUPER B COMPLEX PO) Take 2 tablets by mouth daily.  . Blood Glucose Monitoring Suppl (ONE TOUCH ULTRA SYSTEM KIT) W/DEVICE  KIT 1 kit by Does not apply route once.  Marland Kitchen glucose blood (ONE TOUCH ULTRA TEST) test strip USE AS DIRECTED TO TEST BLOOD GLUCE TWICE DAILY  . ibuprofen (ADVIL,MOTRIN) 200 MG tablet Take 400 mg by mouth every 3 (three) hours as needed.  Marland Kitchen lisinopril (PRINIVIL,ZESTRIL) 5 MG tablet TAKE 1 TABLET BY MOUTH EVERY DAY  . magnesium 30 MG tablet Take 30 mg by mouth daily.  . metFORMIN (GLUCOPHAGE) 1000 MG tablet TAKE 1 TABLET BY MOUTH TWICE A DAY WITH MEALS  . nystatin-triamcinolone ointment (MYCOLOG) Apply topically 2 (two) times daily.  Glory Rosebush DELICA LANCETS MISC Check glucose twice daily  . pioglitazone (ACTOS) 45 MG tablet TAKE 1 TABLET(45 MG) BY MOUTH DAILY  . senna (SENOKOT) 8.6 MG TABS Take 2 tablets by mouth daily.   . [DISCONTINUED] CINNAMON PO Take 1 capsule by mouth daily.   No facility-administered encounter medications on file as of 04/03/2018.     Activities of Daily Living In your present state of health, do you have any difficulty performing the following activities: 04/03/2018  Hearing? N  Vision? N  Difficulty concentrating or making decisions? N  Walking or climbing stairs? N  Dressing or bathing? N  Doing errands, shopping? N  Preparing Food and eating ? N  Using the Toilet? N  In the past six months, have you accidently leaked urine? N  Do you have problems with loss of bowel control? N  Managing your Medications? N  Managing your Finances? N  Housekeeping or managing your Housekeeping? N  Some recent data might be hidden    Patient Care Team: Tammi Sou, MD as PCP - General (Family Medicine) Rutherford Guys, MD as Consulting Physician (Ophthalmology) Renato Shin, MD as Consulting  Physician (Endocrinology) Martinique, Peter M, MD as Consulting Physician (Cardiology) Gatha Mayer, MD as Consulting Physician (Gastroenterology) Rosemarie Ax, MD as Consulting Physician (Sports Medicine)   Assessment:   This is a routine wellness examination for Matthew Ramirez.  Exercise Activities and Dietary recommendations Current Exercise Habits: Home exercise routine, Type of exercise: walking, Time (Minutes): 45, Frequency (Times/Week): 5, Weekly Exercise (Minutes/Week): 225, Exercise limited by: None identified   Diet (meal preparation, eat out, water intake, caffeinated beverages, dairy products, fruits and vegetables): Drinks diet soda.   Follows low carb diet, 2 meals/day.   Goals    . patient     Maintain current health by staying active.        Fall Risk Fall Risk  04/03/2018 03/28/2017 11/26/2015 06/12/2015 01/23/2015  Falls in the past year? _0     Depression Screen PHQ 2/9 Scores 04/03/2018 03/28/2017 11/26/2015 06/12/2015  PHQ - 2 Score 0 0 0 0    Cognitive Function MMSE - Mini Mental State Exam 04/03/2018  Orientation to time 5  Orientation to Place 5  Registration 3  Attention/ Calculation 5  Recall 1  Language- name 2 objects 2  Language- repeat 1  Language- follow 3 step command 3  Language- read & follow direction 1  Write a sentence 1  Copy design 1  Total score 28        Immunization History  Administered Date(s) Administered  . Tdap 05/04/2011    Screening Tests Health Maintenance  Topic Date Due  . COLONOSCOPY  10/03/2017  . INFLUENZA VACCINE  02/09/2018  . FOOT EXAM  03/28/2018  . PNA vac Low Risk Adult (1 of 2 - PCV13) 10/04/2018 (Originally 11/30/2007)  . HEMOGLOBIN A1C  04/05/2018  . OPHTHALMOLOGY EXAM  11/29/2018  . TETANUS/TDAP  05/03/2021   Diabetic Foot Exam - Simple   Simple Foot Form Diabetic Foot exam was performed with the following findings:  Yes 04/03/2018  8:40 AM  Visual Inspection No deformities, no  ulcerations, no other skin breakdown bilaterally:  Yes Sensation Testing Intact to touch and monofilament testing bilaterally:  Yes Pulse Check Posterior Tibialis and Dorsalis pulse intact bilaterally:  Yes Comments          Plan:    Schedule GI appointment.   Continue doing brain stimulating activities (puzzles, reading, adult coloring books, staying active) to keep memory sharp.   I have personally reviewed and noted the following in the patient's chart:   . Medical and social history . Use of alcohol, tobacco or illicit drugs  . Current medications and supplements . Functional ability and status . Nutritional status . Physical activity . Advanced directives . List of other physicians . Hospitalizations, surgeries, and ER visits in previous 12 months . Vitals . Screenings to include cognitive, depression, and falls . Referrals and appointments  In addition, I have reviewed and discussed with patient certain preventive protocols, quality metrics, and best practice recommendations. A written personalized care plan for preventive services as well as general preventive health recommendations were provided to patient.     Gerilyn Nestle, RN  04/03/2018  PCP Notes: -F/U with PCP 04/07/18

## 2018-04-03 NOTE — Telephone Encounter (Signed)
Sent!

## 2018-04-03 NOTE — Telephone Encounter (Signed)
Patient is out of Metformin AND Pioglipazone. Please send RX for both of the above listed medications to [facility] in [place]

## 2018-04-03 NOTE — Patient Instructions (Addendum)
Schedule GI appointment.   Continue doing brain stimulating activities (puzzles, reading, adult coloring books, staying active) to keep memory sharp.    Health Maintenance, Male A healthy lifestyle and preventive care is important for your health and wellness. Ask your health care provider about what schedule of regular examinations is right for you. What should I know about weight and diet? Eat a Healthy Diet  Eat plenty of vegetables, fruits, whole grains, low-fat dairy products, and lean protein.  Do not eat a lot of foods high in solid fats, added sugars, or salt.  Maintain a Healthy Weight Regular exercise can help you achieve or maintain a healthy weight. You should:  Do at least 150 minutes of exercise each week. The exercise should increase your heart rate and make you sweat (moderate-intensity exercise).  Do strength-training exercises at least twice a week.  Watch Your Levels of Cholesterol and Blood Lipids  Have your blood tested for lipids and cholesterol every 5 years starting at 75 years of age. If you are at high risk for heart disease, you should start having your blood tested when you are 75 years old. You may need to have your cholesterol levels checked more often if: ? Your lipid or cholesterol levels are high. ? You are older than 75 years of age. ? You are at high risk for heart disease.  What should I know about cancer screening? Many types of cancers can be detected early and may often be prevented. Lung Cancer  You should be screened every year for lung cancer if: ? You are a current smoker who has smoked for at least 30 years. ? You are a former smoker who has quit within the past 15 years.  Talk to your health care provider about your screening options, when you should start screening, and how often you should be screened.  Colorectal Cancer  Routine colorectal cancer screening usually begins at 75 years of age and should be repeated every 5-10 years  until you are 75 years old. You may need to be screened more often if early forms of precancerous polyps or small growths are found. Your health care provider may recommend screening at an earlier age if you have risk factors for colon cancer.  Your health care provider may recommend using home test kits to check for hidden blood in the stool.  A small camera at the end of a tube can be used to examine your colon (sigmoidoscopy or colonoscopy). This checks for the earliest forms of colorectal cancer.  Prostate and Testicular Cancer  Depending on your age and overall health, your health care provider may do certain tests to screen for prostate and testicular cancer.  Talk to your health care provider about any symptoms or concerns you have about testicular or prostate cancer.  Skin Cancer  Check your skin from head to toe regularly.  Tell your health care provider about any new moles or changes in moles, especially if: ? There is a change in a mole's size, shape, or color. ? You have a mole that is larger than a pencil eraser.  Always use sunscreen. Apply sunscreen liberally and repeat throughout the day.  Protect yourself by wearing long sleeves, pants, a wide-brimmed hat, and sunglasses when outside.  What should I know about heart disease, diabetes, and high blood pressure?  If you are 3-47 years of age, have your blood pressure checked every 3-5 years. If you are 60 years of age or older, have your  blood pressure checked every year. You should have your blood pressure measured twice-once when you are at a hospital or clinic, and once when you are not at a hospital or clinic. Record the average of the two measurements. To check your blood pressure when you are not at a hospital or clinic, you can use: ? An automated blood pressure machine at a pharmacy. ? A home blood pressure monitor.  Talk to your health care provider about your target blood pressure.  If you are between 63-53  years old, ask your health care provider if you should take aspirin to prevent heart disease.  Have regular diabetes screenings by checking your fasting blood sugar level. ? If you are at a normal weight and have a low risk for diabetes, have this test once every three years after the age of 73. ? If you are overweight and have a high risk for diabetes, consider being tested at a younger age or more often.  A one-time screening for abdominal aortic aneurysm (AAA) by ultrasound is recommended for men aged 89-75 years who are current or former smokers. What should I know about preventing infection? Hepatitis B If you have a higher risk for hepatitis B, you should be screened for this virus. Talk with your health care provider to find out if you are at risk for hepatitis B infection. Hepatitis C Blood testing is recommended for:  Everyone born from [birth year] through 1963.  Anyone with known risk factors for hepatitis C.  Sexually Transmitted Diseases (STDs)  You should be screened each year for STDs including gonorrhea and chlamydia if: ? You are sexually active and are younger than 75 years of age. ? You are older than 75 years of age and your health care provider tells you that you are at risk for this type of infection. ? Your sexual activity has changed since you were last screened and you are at an increased risk for chlamydia or gonorrhea. Ask your health care provider if you are at risk.  Talk with your health care provider about whether you are at high risk of being infected with HIV. Your health care provider may recommend a prescription medicine to help prevent HIV infection.  What else can I do?  Schedule regular health, dental, and eye exams.  Stay current with your vaccines (immunizations).  Do not use any tobacco products, such as cigarettes, chewing tobacco, and e-cigarettes. If you need help quitting, ask your health care provider.  Limit alcohol intake to no more than 2  drinks per day. One drink equals 12 ounces of beer, 5 ounces of wine, or 1 ounces of hard liquor.  Do not use street drugs.  Do not share needles.  Ask your health care provider for help if you need support or information about quitting drugs.  Tell your health care provider if you often feel depressed.  Tell your health care provider if you have ever been abused or do not feel safe at home. This information is not intended to replace advice given to you by your health care provider. Make sure you discuss any questions you have with your health care provider. Document Released: 12/25/2007 Document Revised: 02/25/2016 Document Reviewed: 04/01/2015 Elsevier Interactive Patient Education  [employer].

## 2018-04-07 ENCOUNTER — Encounter: Payer: Self-pay | Admitting: Family Medicine

## 2018-04-07 ENCOUNTER — Ambulatory Visit: Payer: Medicare HMO | Admitting: Family Medicine

## 2018-04-07 NOTE — Progress Notes (Deleted)
OFFICE VISIT  04/07/2018   CC: No chief complaint on file.   HPI:    Patient is a 77 y.o. Caucasian male who presents for 6 mo f/u DM 2, HTN, HLD.  DM 2: it appears that pt is still getting his DM managed by Dr. Ellison, as there is a note from just a few days ago stating that Dr. Ellison sent in rx's for pt's diabetic meds.  HTN:   HLD:  He has repeatedly declined any medication treatment for his high cholesterol.  Colon polyps: pt was unclear about need for repeat colonoscopy or not when I saw him 09/2017-->he was to call his GI MD and ask (as per our records he was supposed to get repeat TCS 09/2017).     Past Medical History:  Diagnosis Date  . Asthma    as child-no mdi use now  . Bifascicular block 2017  . 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  Stable cysts 03/31/17 MRI.  . DM (diabetes mellitus) ([facility]) 05/2011  . 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 External hemorrhoids   . Hearing impairment    Occupational noise damage x years.  Has hearing aids AU.  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  . History of colon polyps 2010  . HTN (hypertension), benign   . Hyperlipidemia 03/2017   Recommended statin 03/2017; pt declined (he has done this multiple times, most recently 09/2017.)  . Liver mass 11/2015   MRI abd equivocal regarding benign/malig--recommended f/u liver u/s with doppler in 3 mo (u/s ordered by Dr. Gessner already)  . 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 Personal history of colonic adenomas 2009; 09/14/2012   Polypectomy 2010 and 2014.  . Plantar fasciitis    deep tissue laser tx at Dr. Boudreaux (chiro) helped A LOT!  . Polycystic kidney disease    Normal renal function as of 11/2015.  Stable on imaging 09/2016--recommended repeat in 6 mo.  Stable bilat cysts on MRI 03/31/17  . 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  . Tachycardia 2017   Echo and event monitor negative.  No recurrence as of cardiology f/u 02/2016.     Past Surgical History:  Procedure Laterality Date  . CIRCUMCISION  age 22  . COLONOSCOPY W/ POLYPECTOMY  2009; 2014   09/2012: tubular adenoma--no high grade dysplasia.  Repeat 09/2017 approx.  Diverticulosis and small int hem  . DENTAL SURGERY     10+ root canals, several bridges  . EYE SURGERY     Right (s/p traumatic injury to eye)  . TRANSTHORACIC ECHOCARDIOGRAM  07/16/08; 11/2015   EF 55-60%, mild LVH and RVH, impaired relaxation.  2017 moderate LVH, EF 65-70%, grade I DD.  . US AORTA: SCREENING  11/2015   No aneurism but "ectatic" portion of aorta was noted; repeat u/s in 5 yrs recommended    Outpatient Medications Prior to Visit  Medication Sig Dispense Refill  . B Complex-C (SUPER B COMPLEX PO) Take 2 tablets by mouth daily.    . Blood Glucose Monitoring Suppl (ONE TOUCH ULTRA SYSTEM KIT) W/DEVICE KIT 1 kit by Does not apply route once. 100 each 3  . glucose blood (ONE TOUCH ULTRA TEST) test strip USE AS DIRECTED TO TEST BLOOD GLUCE TWICE DAILY 100 each 11  . ibuprofen (ADVIL,MOTRIN) 200 MG tablet Take   400 mg by mouth every 3 (three) hours as needed.    . lisinopril (PRINIVIL,ZESTRIL) 5 MG tablet TAKE 1 TABLET BY MOUTH EVERY DAY 90 tablet 1  . magnesium 30 MG tablet Take 30 mg by mouth daily.    . metFORMIN (GLUCOPHAGE) 1000 MG tablet Take 1 tablet (1,000 mg total) by mouth 2 (two) times daily with a meal. 180 tablet 0  . nystatin-triamcinolone ointment (MYCOLOG) Apply topically 2 (two) times daily. 30 g 2  . ONETOUCH DELICA LANCETS MISC Check glucose twice daily 60 each 5  . pioglitazone (ACTOS) 45 MG tablet TAKE 1 TABLET(45 MG) BY MOUTH DAILY 90 tablet 2  . senna (SENOKOT) 8.6 MG TABS Take 2 tablets by mouth daily.      No facility-administered medications prior to visit.     No Known Allergies  ROS As per  HPI  PE: There were no vitals taken for this visit. ***  LABS:  Lab Results  Component Value Date   TSH 2.45 09/23/2014   Lab Results  Component Value Date   WBC 9.6 10/03/2017   HGB 14.6 10/03/2017   HCT 42.4 10/03/2017   MCV 88.3 10/03/2017   PLT 202 10/03/2017   Lab Results  Component Value Date   CREATININE 0.66 (L) 10/03/2017   BUN 13 10/03/2017   NA 138 10/03/2017   K 4.4 10/03/2017   CL 102 10/03/2017   CO2 27 10/03/2017   Lab Results  Component Value Date   ALT 15 10/03/2017   AST 16 10/03/2017   ALKPHOS 63 12/23/2015   BILITOT 0.6 10/03/2017   Lab Results  Component Value Date   CHOL 200 (H) 10/03/2017   Lab Results  Component Value Date   HDL 49 10/03/2017   Lab Results  Component Value Date   LDLCALC 120 (H) 10/03/2017   Lab Results  Component Value Date   TRIG 191 (H) 10/03/2017   Lab Results  Component Value Date   CHOLHDL 4.1 10/03/2017   Lab Results  Component Value Date   PSA 0.55 05/11/2011   Lab Results  Component Value Date   HGBA1C 6.9 (H) 10/03/2017    IMPRESSION AND PLAN:  No problem-specific Assessment & Plan notes found for this encounter.   An After Visit Summary was printed and given to the patient.  FOLLOW UP: No follow-ups on file.  Signed:  Phil McGowen, MD           04/07/2018     

## 2018-04-10 NOTE — Progress Notes (Signed)
AWV reviewed and agree. Signed:  [name], MD           04/10/2018

## 2018-04-20 ENCOUNTER — Encounter: Payer: Self-pay | Admitting: Family Medicine

## 2018-04-20 ENCOUNTER — Ambulatory Visit (INDEPENDENT_AMBULATORY_CARE_PROVIDER_SITE_OTHER): Payer: Medicare HMO | Admitting: Family Medicine

## 2018-04-20 VITALS — BP 122/70 | HR 71 | Temp 98.3°F | Resp 16 | Ht 68.5 in | Wt 208.0 lb

## 2018-04-20 DIAGNOSIS — E119 Type 2 diabetes mellitus without complications: Secondary | ICD-10-CM | POA: Diagnosis not present

## 2018-04-20 DIAGNOSIS — I10 Essential (primary) hypertension: Secondary | ICD-10-CM | POA: Diagnosis not present

## 2018-04-20 NOTE — Progress Notes (Signed)
OFFICE VISIT  04/20/2018   CC:  Chief Complaint  Patient presents with  . Follow-up    RCI, pt is fasting    HPI:    Patient is a 77 y.o. Caucasian male who presents for 6 mo f/u DM 2, HTN,   DM: he is on full dose metformin and pioglitazone, last A1c 6 mo ago was 6.9%. Fasting glucoses daily--150avg, 130-160s range. Fair diabetic diet.  HTN: no home bp monitoring.  Compliant with med.  No CP, palpitations, or SOB, or dizziness.    Past Medical History:  Diagnosis Date  . Asthma    as child-no mdi use now  . Bifascicular block 2017  . 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  Stable cysts 03/31/17 MRI.  . DM (diabetes mellitus) ([place]) 05/2011  . 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 External hemorrhoids   . Hearing impairment    Occupational noise damage x years.  Has hearing aids AU.  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  Marland Kitchen History of colon polyps 2010  . HTN (hypertension), benign   . Hyperlipidemia 03/2017   Recommended statin 03/2017; pt declined (he has done this multiple times, most recently 09/2017.)  . Liver mass 11/2015   MRI abd equivocal regarding benign/malig--recommended f/u liver u/s with doppler in 3 mo (u/s ordered by Dr. Carlean Purl already)  . 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 Personal history of colonic adenomas 2009; 09/14/2012   Polypectomy 2010 and 2014.  Marland Kitchen Plantar fasciitis    deep tissue laser tx at Dr. Emily Filbert (chiro) helped A LOT!  Marland Kitchen Polycystic kidney disease    Normal renal function as of 11/2015.  Stable on imaging 09/2016--recommended repeat in 6 mo.  Stable bilat cysts on MRI 03/31/17  . 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  . Tachycardia 2017   Echo and event monitor negative.  No recurrence as of cardiology f/u 02/2016.     Past Surgical  History:  Procedure Laterality Date  . CIRCUMCISION  age 39  . COLONOSCOPY W/ POLYPECTOMY  2009; 2014   09/2012: tubular adenoma--no high grade dysplasia.  Repeat 09/2017 approx.  Diverticulosis and small int hem  . DENTAL SURGERY     10+ root canals, several bridges  . EYE SURGERY     Right (s/p traumatic injury to eye)  . TRANSTHORACIC ECHOCARDIOGRAM  07/16/08; 11/2015   EF 55-60%, mild LVH and RVH, impaired relaxation.  2017 moderate LVH, EF 65-70%, grade I DD.  Marland Kitchen US AORTA: SCREENING  11/2015   No aneurism but "ectatic" portion of aorta was noted; repeat u/s in 5 yrs recommended    Outpatient Medications Prior to Visit  Medication Sig Dispense Refill  . B Complex-C (SUPER B COMPLEX PO) Take 2 tablets by mouth daily.    . Blood Glucose Monitoring Suppl (ONE TOUCH ULTRA SYSTEM KIT) W/DEVICE KIT 1 kit by Does not apply route once. 100 each 3  . glucose blood (ONE TOUCH ULTRA TEST) test strip USE AS DIRECTED TO TEST BLOOD GLUCE TWICE DAILY 100 each 11  . ibuprofen (ADVIL,MOTRIN) 200 MG tablet Take 400 mg by mouth every 3 (three) hours as needed.    Marland Kitchen lisinopril (PRINIVIL,ZESTRIL) 5 MG tablet TAKE 1 TABLET BY MOUTH EVERY DAY 90 tablet 1  . Magnesium 500 MG TABS Take 2 tablets  by mouth daily.     . metFORMIN (GLUCOPHAGE) 1000 MG tablet Take 1 tablet (1,000 mg total) by mouth 2 (two) times daily with a meal. 180 tablet 0  . nystatin-triamcinolone ointment (MYCOLOG) Apply topically 2 (two) times daily. 30 g 2  . ONETOUCH DELICA LANCETS MISC Check glucose twice daily 60 each 5  . pioglitazone (ACTOS) 45 MG tablet TAKE 1 TABLET(45 MG) BY MOUTH DAILY 90 tablet 2  . senna (SENOKOT) 8.6 MG TABS Take 2 tablets by mouth daily.      No facility-administered medications prior to visit.     No Known Allergies  ROS As per HPI  PE: Blood pressure 122/70, pulse 71, temperature 98.3 F (36.8 C), temperature source Oral, resp. rate 16, height 5' 8.5" (1.74 m), weight 208 lb (94.3 kg), SpO2 93 %. Gen:  Alert, well appearing.  Patient is oriented to person, place, time, and situation. AFFECT: pleasant, lucid thought and speech. CV: RRR, no m/r/g.   LUNGS: CTA bilat, nonlabored resps, good aeration in all lung fields. EXT: no clubbing or cyanosis.  2+ bilat pitting edema.    LABS:  Lab Results  Component Value Date   TSH 2.45 09/23/2014   Lab Results  Component Value Date   WBC 9.6 10/03/2017   HGB 14.6 10/03/2017   HCT 42.4 10/03/2017   MCV 88.3 10/03/2017   PLT 202 10/03/2017   Lab Results  Component Value Date   CREATININE 0.66 (L) 10/03/2017   BUN 13 10/03/2017   NA 138 10/03/2017   K 4.4 10/03/2017   CL 102 10/03/2017   CO2 27 10/03/2017   Lab Results  Component Value Date   ALT 15 10/03/2017   AST 16 10/03/2017   ALKPHOS 63 12/23/2015   BILITOT 0.6 10/03/2017   Lab Results  Component Value Date   CHOL 200 (H) 10/03/2017   Lab Results  Component Value Date   HDL 49 10/03/2017   Lab Results  Component Value Date   LDLCALC 120 (H) 10/03/2017   Lab Results  Component Value Date   TRIG 191 (H) 10/03/2017   Lab Results  Component Value Date   CHOLHDL 4.1 10/03/2017   Lab Results  Component Value Date   PSA 0.55 05/11/2011   Lab Results  Component Value Date   HGBA1C 6.9 (H) 10/03/2017    IMPRESSION AND PLAN:  1) DM 2: historically pretty good control. HbA1c today.  2) HTN: The current medical regimen is effective;  continue present plan and medications. BMET today.  An After Visit Summary was printed and given to the patient.  FOLLOW UP: Return in about 6 months (around 10/20/2018) for annual CPE (fasting).  Signed:  [name], MD           04/20/2018

## 2018-04-21 LAB — BASIC METABOLIC PANEL
BUN: 19 mg/dL (ref 7–25)
CALCIUM: 9.4 mg/dL (ref 8.6–10.3)
CO2: 28 mmol/L (ref 20–32)
CREATININE: 0.82 mg/dL (ref 0.70–1.18)
Chloride: 99 mmol/L (ref 98–110)
GLUCOSE: 143 mg/dL — AB (ref 65–99)
Potassium: 4.2 mmol/L (ref 3.5–5.3)
Sodium: 137 mmol/L (ref 135–146)

## 2018-04-21 LAB — HEMOGLOBIN A1C
EAG (MMOL/L): 8.9 (calc)
HEMOGLOBIN A1C: 7.2 %{Hb} — AB (ref <5.7)
MEAN PLASMA GLUCOSE: 160 (calc)

## 2018-05-11 ENCOUNTER — Other Ambulatory Visit: Payer: Self-pay | Admitting: Family Medicine

## 2018-06-25 ENCOUNTER — Other Ambulatory Visit: Payer: Self-pay | Admitting: Endocrinology

## 2018-07-03 ENCOUNTER — Other Ambulatory Visit: Payer: Self-pay

## 2018-07-03 MED ORDER — METFORMIN HCL 1000 MG PO TABS: 1000.0000 mg | ORAL_TABLET | Freq: Two times a day (BID) | ORAL | 0 refills | Status: DC

## 2018-07-18 ENCOUNTER — Encounter: Payer: Self-pay | Admitting: Family Medicine

## 2018-09-25 ENCOUNTER — Other Ambulatory Visit: Payer: Self-pay | Admitting: Endocrinology

## 2018-09-27 ENCOUNTER — Other Ambulatory Visit: Payer: Self-pay | Admitting: Family Medicine

## 2018-09-27 DIAGNOSIS — R69 Illness, unspecified: Secondary | ICD-10-CM | POA: Diagnosis not present

## 2018-10-17 ENCOUNTER — Encounter: Payer: Medicare HMO | Admitting: Family Medicine

## 2018-11-07 ENCOUNTER — Other Ambulatory Visit: Payer: Self-pay

## 2018-11-07 MED ORDER — LISINOPRIL 5 MG PO TABS: 5.0000 mg | ORAL_TABLET | Freq: Every day | ORAL | 0 refills | Status: DC

## 2018-11-13 DIAGNOSIS — R69 Illness, unspecified: Secondary | ICD-10-CM | POA: Diagnosis not present

## 2018-11-29 ENCOUNTER — Encounter: Payer: Medicare HMO | Admitting: Family Medicine

## 2018-12-05 DIAGNOSIS — H04123 Dry eye syndrome of bilateral lacrimal glands: Secondary | ICD-10-CM | POA: Diagnosis not present

## 2018-12-05 DIAGNOSIS — Z961 Presence of intraocular lens: Secondary | ICD-10-CM | POA: Diagnosis not present

## 2018-12-05 DIAGNOSIS — H524 Presbyopia: Secondary | ICD-10-CM | POA: Diagnosis not present

## 2018-12-05 DIAGNOSIS — E119 Type 2 diabetes mellitus without complications: Secondary | ICD-10-CM | POA: Diagnosis not present

## 2018-12-05 DIAGNOSIS — H25812 Combined forms of age-related cataract, left eye: Secondary | ICD-10-CM | POA: Diagnosis not present

## 2018-12-05 DIAGNOSIS — H4322 Crystalline deposits in vitreous body, left eye: Secondary | ICD-10-CM | POA: Diagnosis not present

## 2018-12-05 DIAGNOSIS — H52203 Unspecified astigmatism, bilateral: Secondary | ICD-10-CM | POA: Diagnosis not present

## 2018-12-05 LAB — HM DIABETES EYE EXAM

## 2018-12-13 ENCOUNTER — Encounter: Payer: Self-pay | Admitting: Family Medicine

## 2018-12-19 ENCOUNTER — Other Ambulatory Visit: Payer: Self-pay | Admitting: Family Medicine

## 2018-12-28 ENCOUNTER — Other Ambulatory Visit: Payer: Self-pay | Admitting: Endocrinology

## 2018-12-31 DIAGNOSIS — R69 Illness, unspecified: Secondary | ICD-10-CM | POA: Diagnosis not present

## 2019-01-11 ENCOUNTER — Other Ambulatory Visit: Payer: Self-pay | Admitting: Family Medicine

## 2019-01-29 ENCOUNTER — Other Ambulatory Visit: Payer: Self-pay | Admitting: Family Medicine

## 2019-02-02 ENCOUNTER — Other Ambulatory Visit: Payer: Self-pay | Admitting: Family Medicine

## 2019-02-02 NOTE — Telephone Encounter (Signed)
Patient will have enough medication to last until appointment

## 2019-02-09 ENCOUNTER — Encounter: Payer: Medicare HMO | Admitting: Family Medicine

## 2019-02-16 ENCOUNTER — Encounter: Payer: Self-pay | Admitting: Family Medicine

## 2019-02-16 ENCOUNTER — Other Ambulatory Visit: Payer: Self-pay

## 2019-02-16 ENCOUNTER — Other Ambulatory Visit: Payer: Self-pay | Admitting: Family Medicine

## 2019-02-16 ENCOUNTER — Ambulatory Visit (INDEPENDENT_AMBULATORY_CARE_PROVIDER_SITE_OTHER): Payer: Medicare HMO | Admitting: Family Medicine

## 2019-02-16 VITALS — BP 119/74 | HR 73 | Temp 97.9°F | Resp 16 | Ht 68.5 in | Wt 197.6 lb

## 2019-02-16 DIAGNOSIS — I10 Essential (primary) hypertension: Secondary | ICD-10-CM | POA: Diagnosis not present

## 2019-02-16 DIAGNOSIS — E7800 Pure hypercholesterolemia, unspecified: Secondary | ICD-10-CM

## 2019-02-16 DIAGNOSIS — E119 Type 2 diabetes mellitus without complications: Secondary | ICD-10-CM | POA: Diagnosis not present

## 2019-02-16 NOTE — Patient Instructions (Signed)

## 2019-02-16 NOTE — Progress Notes (Signed)
Office Note 02/16/2019  CC:  Chief Complaint  Patient presents with  . Annual Exam    pt is fasting    HPI:  Matthew Ramirez is a 78 y.o. White male who is here for annual health maintenance exam.  He is eating less on purpose b/c he wants to lose some wt. Working in yard a lot. Has lost 10 lb in the last year.  DM: glucoses usually 150-160. A couple days ago he had some glucoses in 200-300 range. He can't understand why b/c he says he has eaten better lately than.  He did say he has been less active than normal during these days. Hasn't had actos in about 1 mo--ran out and thought his endocrinologist would fill it.  Past Medical History:  Diagnosis Date  . Asthma    as child-no mdi use now  . Bifascicular block 2017  . 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  Stable cysts 03/31/17 MRI.  . DM (diabetes mellitus) ([place]) 05/2011  . 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 External hemorrhoids   . Hearing impairment    Occupational noise damage x years.  Has hearing aids AU.  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  Marland Kitchen History of colon polyps 2010  . HTN (hypertension), benign   . Hyperlipidemia 03/2017   Recommended statin 03/2017; pt declined (he has done this multiple times, most recently 09/2017.)  . Liver mass 11/2015   MRI abd equivocal regarding benign/malig--recommended f/u liver u/s with doppler in 3 mo (u/s ordered by Dr. Carlean Purl already)  . 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 Personal history of colonic adenomas 2009; 09/14/2012   Polypectomy 2010 and 2014.  Marland Kitchen Plantar fasciitis    deep tissue laser tx at Dr. Emily Filbert (chiro) helped A LOT!  Marland Kitchen Polycystic kidney disease    Normal renal function as of 11/2015.  Stable on imaging 09/2016--recommended repeat in 6 mo.  Stable bilat cysts on MRI 03/31/17  . 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  . Tachycardia 2017   Echo and event monitor negative.  No recurrence as of cardiology f/u 02/2016.     Past Surgical History:  Procedure Laterality Date  . CIRCUMCISION  age 74  . COLONOSCOPY W/ POLYPECTOMY  2009; 2014   09/2012: tubular adenoma--no high grade dysplasia.  Repeat 09/2017 approx.  Diverticulosis and small int hem  . DENTAL SURGERY     10+ root canals, several bridges  . EYE SURGERY     Right (s/p traumatic injury to eye)  . TRANSTHORACIC ECHOCARDIOGRAM  07/16/08; 11/2015   EF 55-60%, mild LVH and RVH, impaired relaxation.  2017 moderate LVH, EF 65-70%, grade I DD.  Marland Kitchen US AORTA: SCREENING  11/2015   No aneurism but "ectatic" portion of aorta was noted; repeat u/s in 5 yrs recommended    Family History  Problem Relation Age of Onset  . Cancer Mother        leukemia  . Diabetes Mother   . Cancer Sister        ovarian  . Emphysema Father        smoker  . Diabetes Sister   . Kidney disease Brother   . Heart disease Brother   . Colon cancer Neg Hx   . Rectal cancer Neg Hx   .  Stomach cancer Neg Hx   . Pancreatic cancer Neg Hx   . Prostate cancer Neg Hx   . Esophageal cancer Neg Hx   . Liver cancer Neg Hx   . Liver disease Neg Hx     Social History   Socioeconomic History  . Marital status: Married    Spouse name: Not on file  . Number of children: 4  . Years of education: Not on file  . Highest education level: Not on file  Occupational History  . Occupation: Chief Financial Officer  Social Needs  . Financial resource strain: Not on file  . Food insecurity    Worry: Not on file    Inability: Not on file  . Transportation needs    Medical: Not on file    Non-medical: Not on file  Tobacco Use  . Smoking status: Former Smoker    Packs/day: 1.00    Years: 20.00    Pack years: 20.00    Types: Cigarettes    Quit date: 07/12/1968    Years since quitting: 50.6  . Smokeless tobacco:  Never Used  Substance and Sexual Activity  . Alcohol use: Yes    Alcohol/week: 0.0 standard drinks    Comment: a couple of drinks a week  . Drug use: No  . Sexual activity: Not on file  Lifestyle  . Physical activity    Days per week: Not on file    Minutes per session: Not on file  . Stress: Not on file  Relationships  . Social Herbalist on phone: Not on file    Gets together: Not on file    Attends religious service: Not on file    Active member of club or organization: Not on file    Attends meetings of clubs or organizations: Not on file    Relationship status: Not on file  . Intimate partner violence    Fear of current or ex partner: Not on file    Emotionally abused: Not on file    Physically abused: Not on file    Forced sexual activity: Not on file  Other Topics Concern  . Not on file  Social History Narrative   Married, has 4 grown daughters, 15 grandchildren, and 1 great grandchild.   Occupation: Chief Financial Officer.  Originally from [facility] [place] [facility], has lived in [place] since age 27 yrs.   Distant tobacco abuse: quit 1970.  Rare ETOH intake, no hx of ETOH abuse.  No drug use.   Walks about 4 miles per day.    Outpatient Medications Prior to Visit  Medication Sig Dispense Refill  . B Complex-C (SUPER B COMPLEX PO) Take 2 tablets by mouth daily.    . Blood Glucose Monitoring Suppl (ONE TOUCH ULTRA SYSTEM KIT) W/DEVICE KIT 1 kit by Does not apply route once. 100 each 3  . glucose blood test strip USE AS DIRECTED TO TEST BLOOD GLUCE TWICE DAILY 100 each 11  . ibuprofen (ADVIL,MOTRIN) 200 MG tablet Take 400 mg by mouth every 3 (three) hours as needed.    Marland Kitchen lisinopril (ZESTRIL) 5 MG tablet TAKE 1 TABLET BY MOUTH EVERY DAY 90 tablet 0  . Magnesium 500 MG TABS Take 2 tablets by mouth daily.     . metFORMIN (GLUCOPHAGE) 1000 MG tablet TAKE 1 TABLET (1,000 MG TOTAL) BY MOUTH 2 (TWO) TIMES DAILY WITH A MEAL. 60 tablet 0  . ONETOUCH DELICA LANCETS MISC  Check glucose twice daily 60 each 5  .  senna (SENOKOT) 8.6 MG TABS Take 2 tablets by mouth daily.     Marland Kitchen nystatin-triamcinolone ointment (MYCOLOG) Apply topically 2 (two) times daily. (Patient not taking: Reported on 02/16/2019) 30 g 2  . pioglitazone (ACTOS) 45 MG tablet TAKE 1 TABLET(45 MG) BY MOUTH DAILY (Patient not taking: Reported on 02/16/2019) 90 tablet 2   No facility-administered medications prior to visit.     No Known Allergies  ROS Review of Systems  Constitutional: Negative for appetite change, chills, fatigue and fever.  HENT: Negative for congestion, dental problem, ear pain and sore throat.   Eyes: Negative for discharge, redness and visual disturbance.  Respiratory: Negative for cough, chest tightness, shortness of breath and wheezing.   Cardiovascular: Negative for chest pain, palpitations and leg swelling.  Gastrointestinal: Negative for abdominal pain, blood in stool, diarrhea, nausea and vomiting.  Genitourinary: Negative for difficulty urinating, dysuria, flank pain, frequency, hematuria and urgency.  Musculoskeletal: Negative for arthralgias, back pain, joint swelling, myalgias and neck stiffness.  Skin: Negative for pallor and rash.  Neurological: Negative for dizziness, speech difficulty, weakness and headaches.  Hematological: Negative for adenopathy. Does not bruise/bleed easily.  Psychiatric/Behavioral: Negative for confusion and sleep disturbance. The patient is not nervous/anxious.     PE; Blood pressure 119/74, pulse 73, temperature 97.9 F (36.6 C), temperature source Temporal, resp. rate 16, height 5' 8.5" (1.74 m), weight 197 lb 9.6 oz (89.6 kg), SpO2 93 %. Body mass index is 29.61 kg/m.  Gen: Alert, well appearing.  Patient is oriented to person, place, time, and situation. AFFECT: pleasant, lucid thought and speech. ENT: Ears: EACs clear, normal epithelium.  TMs with good light reflex and landmarks bilaterally.  Eyes: no injection, icteris, swelling,  or exudate.  EOMI, PERRLA. Nose: no drainage or turbinate edema/swelling.  No injection or focal lesion.  Mouth: lips without lesion/swelling.  Oral mucosa pink and moist.  Dentition intact and without obvious caries or gingival swelling.  Oropharynx without erythema, exudate, or swelling.  Neck: supple/nontender.  No LAD, mass, or TM.  Carotid pulses 2+ bilaterally, without bruits. CV: RRR, no m/r/g.   LUNGS: CTA bilat, nonlabored resps, good aeration in all lung fields. ABD: soft, NT, ND, BS normal.  No hepatospenomegaly or mass.  No bruits. EXT: no clubbing, cyanosis, or edema.  Musculoskeletal: no joint swelling, erythema, warmth, or tenderness.  ROM of all joints intact. Skin - no sores or suspicious lesions or rashes or color changes   Pertinent labs:  Lab Results  Component Value Date   TSH 2.45 09/23/2014   Lab Results  Component Value Date   WBC 9.6 10/03/2017   HGB 14.6 10/03/2017   HCT 42.4 10/03/2017   MCV 88.3 10/03/2017   PLT 202 10/03/2017   Lab Results  Component Value Date   CREATININE 0.82 04/20/2018   BUN 19 04/20/2018   NA 137 04/20/2018   K 4.2 04/20/2018   CL 99 04/20/2018   CO2 28 04/20/2018   Lab Results  Component Value Date   ALT 15 10/03/2017   AST 16 10/03/2017   ALKPHOS 63 12/23/2015   BILITOT 0.6 10/03/2017   Lab Results  Component Value Date   CHOL 200 (H) 10/03/2017   Lab Results  Component Value Date   HDL 49 10/03/2017   Lab Results  Component Value Date   LDLCALC 120 (H) 10/03/2017   Lab Results  Component Value Date   TRIG 191 (H) 10/03/2017   Lab Results  Component Value Date  CHOLHDL 4.1 10/03/2017   Lab Results  Component Value Date   PSA 0.55 05/11/2011   Lab Results  Component Value Date   HGBA1C 7.2 (H) 04/20/2018    ASSESSMENT AND PLAN:   Health maintenance exam: Reviewed age and gender appropriate health maintenance issues (prudent diet, regular exercise, health risks of tobacco and excessive alcohol,  use of seatbelts, fire alarms in home, use of sunscreen).  Also reviewed age and gender appropriate health screening as well as vaccine recommendations. Vaccines: pt continues to decline prevnar, pneumovax, and shingrix vaccines. Labs: CBC, CMET, FLP, A1c.  Pt says he is fine having me follow his DM at this time (used to be managed by Dr. Loanne Drilling). Prostate ca screening: no further prostate ca screening. Colon ca screening: pt declines any further colon cancer screening.  An After Visit Summary was printed and given to the patient.  FOLLOW UP:  Return in about 6 months (around 08/19/2019) for routine chronic illness f/u.  Signed:  [name], MD           02/16/2019

## 2019-02-17 LAB — CBC WITH DIFFERENTIAL/PLATELET
Absolute Monocytes: 688 cells/uL (ref 200–950)
Basophils Absolute: 40 cells/uL (ref 0–200)
Basophils Relative: 0.5 %
Eosinophils Absolute: 184 cells/uL (ref 15–500)
Eosinophils Relative: 2.3 %
HCT: 41.8 % (ref 38.5–50.0)
Hemoglobin: 14.3 g/dL (ref 13.2–17.1)
Lymphs Abs: 1760 cells/uL (ref 850–3900)
MCH: 30.9 pg (ref 27.0–33.0)
MCHC: 34.2 g/dL (ref 32.0–36.0)
MCV: 90.3 fL (ref 80.0–100.0)
MPV: 10.9 fL (ref 7.5–12.5)
Monocytes Relative: 8.6 %
Neutro Abs: 5328 cells/uL (ref 1500–7800)
Neutrophils Relative %: 66.6 %
Platelets: 205 10*3/uL (ref 140–400)
RBC: 4.63 10*6/uL (ref 4.20–5.80)
RDW: 13 % (ref 11.0–15.0)
Total Lymphocyte: 22 %
WBC: 8 10*3/uL (ref 3.8–10.8)

## 2019-02-17 LAB — LIPID PANEL
Cholesterol: 210 mg/dL — ABNORMAL HIGH (ref <200)
HDL: 43 mg/dL (ref >=40)
LDL Cholesterol (Calc): 135 mg/dL (calc) — ABNORMAL HIGH
Non-HDL Cholesterol (Calc): 167 mg/dL (calc) — ABNORMAL HIGH (ref <130)
Total CHOL/HDL Ratio: 4.9 (calc) (ref <5.0)
Triglycerides: 186 mg/dL — ABNORMAL HIGH (ref <150)

## 2019-02-17 LAB — HEMOGLOBIN A1C
Hgb A1c MFr Bld: 7.6 % of total Hgb — ABNORMAL HIGH (ref <5.7)
Mean Plasma Glucose: 171 (calc)
eAG (mmol/L): 9.5 (calc)

## 2019-02-17 LAB — COMPREHENSIVE METABOLIC PANEL
AG Ratio: 1.8 (calc) (ref 1.0–2.5)
ALT: 17 U/L (ref 9–46)
AST: 22 U/L (ref 10–35)
Albumin: 4.1 g/dL (ref 3.6–5.1)
Alkaline phosphatase (APISO): 87 U/L (ref 35–144)
BUN: 18 mg/dL (ref 7–25)
CO2: 23 mmol/L (ref 20–32)
Calcium: 8.9 mg/dL (ref 8.6–10.3)
Chloride: 101 mmol/L (ref 98–110)
Creat: 0.84 mg/dL (ref 0.70–1.18)
Globulin: 2.3 g/dL (calc) (ref 1.9–3.7)
Glucose, Bld: 175 mg/dL — ABNORMAL HIGH (ref 65–99)
Potassium: 4.4 mmol/L (ref 3.5–5.3)
Sodium: 136 mmol/L (ref 135–146)
Total Bilirubin: 0.7 mg/dL (ref 0.2–1.2)
Total Protein: 6.4 g/dL (ref 6.1–8.1)

## 2019-02-19 DIAGNOSIS — R69 Illness, unspecified: Secondary | ICD-10-CM | POA: Diagnosis not present

## 2019-02-21 ENCOUNTER — Other Ambulatory Visit: Payer: Self-pay

## 2019-02-21 MED ORDER — SITAGLIPTIN PHOSPHATE 100 MG PO TABS: 100.0000 mg | ORAL_TABLET | Freq: Every day | ORAL | 6 refills | Status: DC

## 2019-02-21 NOTE — Progress Notes (Signed)
janu

## 2019-02-22 ENCOUNTER — Telehealth: Payer: Self-pay

## 2019-02-22 ENCOUNTER — Other Ambulatory Visit: Payer: Self-pay

## 2019-02-22 MED ORDER — PIOGLITAZONE HCL 45 MG PO TABS: ORAL_TABLET | ORAL | 1 refills | Status: DC

## 2019-02-22 NOTE — Telephone Encounter (Signed)
Received voicemail that pt needed to speak with someone regarding one of his Rx's. Spoke with patient and he said he was not able to afford Januvia and needed a refill for the Actos 45mg .

## 2019-03-20 ENCOUNTER — Telehealth: Payer: Self-pay | Admitting: Family Medicine

## 2019-03-20 NOTE — Telephone Encounter (Signed)
Patient states bp med is making him feel lethargic, sleepy. Can he take something else?

## 2019-03-20 NOTE — Telephone Encounter (Signed)
LM for pt to return call regarding current symptoms.

## 2019-03-21 ENCOUNTER — Other Ambulatory Visit: Payer: Self-pay

## 2019-03-21 MED ORDER — METFORMIN HCL 1000 MG PO TABS: 1000.0000 mg | ORAL_TABLET | Freq: Two times a day (BID) | ORAL | 1 refills | Status: DC

## 2019-03-21 NOTE — Telephone Encounter (Signed)
LM for pt to return call regarding current symptoms.

## 2019-03-21 NOTE — Telephone Encounter (Signed)
He has been on lisinopril since approximately 2013 and has never mentioned this problem. Why does he think it is the lisinopril causing this??

## 2019-03-21 NOTE — Telephone Encounter (Signed)
PCP recommended that he stay off med for 2 weeks and see if symptoms resolve. Pt will call back with report after 2 weeks.

## 2019-03-21 NOTE — Telephone Encounter (Signed)
Spoke with patient, he has been feeling lethargic and sleepy since around the time he first starting taking lisinopril. It's not very often but once in a while, he can't function enough o drive at night. Denied any other reactions or side effects. Would like to try something else.  Please advise, thanks.

## 2019-04-07 DIAGNOSIS — R69 Illness, unspecified: Secondary | ICD-10-CM | POA: Diagnosis not present

## 2019-04-22 ENCOUNTER — Other Ambulatory Visit: Payer: Self-pay | Admitting: Family Medicine

## 2019-05-18 DIAGNOSIS — Z20828 Contact with and (suspected) exposure to other viral communicable diseases: Secondary | ICD-10-CM | POA: Diagnosis not present

## 2019-05-18 DIAGNOSIS — Z6827 Body mass index (BMI) 27.0-27.9, adult: Secondary | ICD-10-CM | POA: Diagnosis not present

## 2019-08-15 ENCOUNTER — Other Ambulatory Visit: Payer: Self-pay | Admitting: Family Medicine

## 2019-08-23 ENCOUNTER — Ambulatory Visit: Payer: Medicare HMO | Admitting: Family Medicine

## 2019-08-29 ENCOUNTER — Other Ambulatory Visit: Payer: Self-pay

## 2019-08-29 ENCOUNTER — Encounter: Payer: Self-pay | Admitting: Family Medicine

## 2019-08-29 ENCOUNTER — Ambulatory Visit (INDEPENDENT_AMBULATORY_CARE_PROVIDER_SITE_OTHER): Payer: Medicare HMO | Admitting: Family Medicine

## 2019-08-29 VITALS — BP 134/84 | HR 77 | Temp 98.0°F | Resp 16 | Ht 65.0 in | Wt 207.0 lb

## 2019-08-29 DIAGNOSIS — E119 Type 2 diabetes mellitus without complications: Secondary | ICD-10-CM

## 2019-08-29 DIAGNOSIS — E7800 Pure hypercholesterolemia, unspecified: Secondary | ICD-10-CM

## 2019-08-29 DIAGNOSIS — I10 Essential (primary) hypertension: Secondary | ICD-10-CM | POA: Diagnosis not present

## 2019-08-29 LAB — COMPREHENSIVE METABOLIC PANEL
ALT: 15 U/L (ref 0–53)
AST: 14 U/L (ref 0–37)
Albumin: 4.1 g/dL (ref 3.5–5.2)
Alkaline Phosphatase: 86 U/L (ref 39–117)
BUN: 17 mg/dL (ref 6–23)
CO2: 30 mEq/L (ref 19–32)
Calcium: 9.2 mg/dL (ref 8.4–10.5)
Chloride: 101 mEq/L (ref 96–112)
Creatinine, Ser: 0.84 mg/dL (ref 0.40–1.50)
GFR: 88.67 mL/min (ref >60.00)
Glucose, Bld: 161 mg/dL — ABNORMAL HIGH (ref 70–99)
Potassium: 4.6 mEq/L (ref 3.5–5.1)
Sodium: 136 mEq/L (ref 135–145)
Total Bilirubin: 0.6 mg/dL (ref 0.2–1.2)
Total Protein: 6.7 g/dL (ref 6.0–8.3)

## 2019-08-29 LAB — LIPID PANEL
Cholesterol: 207 mg/dL — ABNORMAL HIGH (ref 0–200)
HDL: 49.8 mg/dL (ref >39.00)
LDL Cholesterol: 137 mg/dL — ABNORMAL HIGH (ref 0–99)
NonHDL: 157.11
Total CHOL/HDL Ratio: 4
Triglycerides: 103 mg/dL (ref 0.0–149.0)
VLDL: 20.6 mg/dL (ref 0.0–40.0)

## 2019-08-29 LAB — MICROALBUMIN / CREATININE URINE RATIO
Creatinine,U: 88.7 mg/dL
Microalb Creat Ratio: 2.9 mg/g (ref 0.0–30.0)
Microalb, Ur: 2.6 mg/dL — ABNORMAL HIGH (ref 0.0–1.9)

## 2019-08-29 LAB — HEMOGLOBIN A1C: Hgb A1c MFr Bld: 8.5 % — ABNORMAL HIGH (ref 4.6–6.5)

## 2019-08-29 NOTE — Progress Notes (Signed)
OFFICE VISIT  08/29/2019   CC:  Chief Complaint  Patient presents with  . Follow-up    RCI, pt is fasting    HPI:    Patient is a 79 y.o. Caucasian male who presents for 6 mo f/u DM, HTN, HLD. Feeling good.  DM: this had been managed by endocrinologist long term until last visit with me, at which time he preferred I go ahead and assume mgmt of this condition for him. Home gluc 150-200, up and down.  Occ over 200.   HTN: home bp's consistently <130/80s.  HLD: no formal exercise last 6 mo.  Diet fairly good. He declines statins.  ROS: no CP, no SOB, no wheezing, no cough, no dizziness, no HAs, no rashes, no melena/hematochezia.  No polyuria or polydipsia.  No myalgias or arthralgias.   Past Medical History:  Diagnosis Date  . Asthma    as child-no mdi use now  . Bifascicular block 2017  . 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  Stable cysts 03/31/17 MRI.  . DM (diabetes mellitus) ([place]) 05/2011  . 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 External hemorrhoids   . Hearing impairment    Occupational noise damage x years.  Has hearing aids AU.  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  Marland Kitchen History of colon polyps 2010  . HTN (hypertension), benign   . Hyperlipidemia 03/2017   Recommended statin 03/2017; pt declined (he has done this multiple times, most recently 09/2017.)  . Liver mass 11/2015   MRI abd equivocal regarding benign/malig--recommended f/u liver u/s with doppler in 3 mo (u/s ordered by Dr. Carlean Purl already)  . 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 Personal history of colonic adenomas 2009; 09/14/2012   Polypectomy 2010 and 2014.  Marland Kitchen Plantar fasciitis    deep tissue laser tx at Dr. Emily Filbert (chiro) helped A LOT!  Marland Kitchen Polycystic kidney disease    Normal renal function as of 11/2015.  Stable on imaging 09/2016--recommended repeat in 6 mo.   Stable bilat cysts on MRI 03/31/17  . 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  . Tachycardia 2017   Echo and event monitor negative.  No recurrence as of cardiology f/u 02/2016.     Past Surgical History:  Procedure Laterality Date  . CIRCUMCISION  age 56  . COLONOSCOPY W/ POLYPECTOMY  2009; 2014   09/2012: tubular adenoma--no high grade dysplasia.  Repeat 09/2017 approx.  Diverticulosis and small int hem  . DENTAL SURGERY     10+ root canals, several bridges  . EYE SURGERY     Right (s/p traumatic injury to eye)  . TRANSTHORACIC ECHOCARDIOGRAM  07/16/08; 11/2015   EF 55-60%, mild LVH and RVH, impaired relaxation.  2017 moderate LVH, EF 65-70%, grade I DD.  Marland Kitchen US AORTA: SCREENING  11/2015   No aneurism but "ectatic" portion of aorta was noted; repeat u/s in 5 yrs recommended    Outpatient Medications Prior to Visit  Medication Sig Dispense Refill  . B Complex-C (SUPER B COMPLEX PO) Take 2 tablets by mouth daily.    . Blood Glucose Monitoring Suppl (ONE TOUCH ULTRA SYSTEM KIT) W/DEVICE KIT 1 kit by Does not apply route once. 100 each 3  . glucose blood test strip USE AS DIRECTED TO TEST BLOOD GLUCE TWICE DAILY 100 each 11  .  ibuprofen (ADVIL,MOTRIN) 200 MG tablet Take 400 mg by mouth every 3 (three) hours as needed.    . Magnesium 500 MG TABS Take 2 tablets by mouth daily.     . metFORMIN (GLUCOPHAGE) 1000 MG tablet Take 1 tablet (1,000 mg total) by mouth 2 (two) times daily with a meal. 180 tablet 1  . ONETOUCH DELICA LANCETS MISC Check glucose twice daily 60 each 5  . pioglitazone (ACTOS) 45 MG tablet TAKE 1 TABLET(45 MG) BY MOUTH DAILY 30 tablet 0  . nystatin-triamcinolone ointment (MYCOLOG) Apply topically 2 (two) times daily. (Patient not taking: Reported on 02/16/2019) 30 g 2  . senna (SENOKOT) 8.6 MG TABS Take 2 tablets by mouth daily.     Marland Kitchen lisinopril (ZESTRIL) 5 MG tablet TAKE 1 TABLET BY MOUTH EVERY DAY (Patient not  taking: Reported on 08/29/2019) 90 tablet 0  . sitaGLIPtin (JANUVIA) 100 MG tablet Take 1 tablet (100 mg total) by mouth daily with breakfast. (Patient not taking: Reported on 08/29/2019) 30 tablet 6   No facility-administered medications prior to visit.    No Known Allergies  ROS As per HPI  PE: Blood pressure 134/84, pulse 77, temperature 98 F (36.7 C), temperature source Temporal, resp. rate 16, height 5' 5" (1.651 m), weight 207 lb (93.9 kg), SpO2 93 %. Body mass index is 34.45 kg/m.  Gen: Alert, well appearing.  Patient is oriented to person, place, time, and situation. AFFECT: pleasant, lucid thought and speech. CV: RRR, distant S1 and S2, no m/r/g.   LUNGS: CTA bilat, nonlabored resps, good aeration in all lung fields. EXT: no clubbing or cyanosis.  1+ R LL pitting and trace L LL pitting edema.    LABS:  Lab Results  Component Value Date   TSH 2.45 09/23/2014   Lab Results  Component Value Date   WBC 8.0 02/16/2019   HGB 14.3 02/16/2019   HCT 41.8 02/16/2019   MCV 90.3 02/16/2019   PLT 205 02/16/2019   Lab Results  Component Value Date   CREATININE 0.84 02/16/2019   BUN 18 02/16/2019   NA 136 02/16/2019   K 4.4 02/16/2019   CL 101 02/16/2019   CO2 23 02/16/2019   Lab Results  Component Value Date   ALT 17 02/16/2019   AST 22 02/16/2019   ALKPHOS 63 12/23/2015   BILITOT 0.7 02/16/2019   Lab Results  Component Value Date   CHOL 210 (H) 02/16/2019   Lab Results  Component Value Date   HDL 43 02/16/2019   Lab Results  Component Value Date   LDLCALC 135 (H) 02/16/2019   Lab Results  Component Value Date   TRIG 186 (H) 02/16/2019   Lab Results  Component Value Date   CHOLHDL 4.9 02/16/2019   Lab Results  Component Value Date   PSA 0.55 05/11/2011   Lab Results  Component Value Date   HGBA1C 7.6 (H) 02/16/2019   IMPRESSION AND PLAN:  1) DM 2: control not ideal.  Has not been on Januvia for >6-12 mo. Work on [employer], increasing  activity. HbA1c, lytes/cr, and urine microalb/cr today.  2) HLD: pt has persistently elected to decline statin treatment. TLC emphasized.  FLP and hepatic panel today.  3) HTN: The current medical regimen is effective;  continue present plan and medications. Has been off lisinopril now for 6-12 mo and bp's have stayed normal.  An After Visit Summary was printed and given to the patient.  FOLLOW UP: Return in about 6 months (  around 02/26/2020) for annual CPE (fasting).  Signed:  Phil McGowen, MD           08/29/2019     

## 2019-08-31 ENCOUNTER — Other Ambulatory Visit: Payer: Self-pay

## 2019-08-31 DIAGNOSIS — E119 Type 2 diabetes mellitus without complications: Secondary | ICD-10-CM

## 2019-08-31 MED ORDER — GLIPIZIDE ER 5 MG PO TB24: 5.0000 mg | ORAL_TABLET | Freq: Every day | ORAL | 3 refills | Status: DC

## 2019-09-08 ENCOUNTER — Other Ambulatory Visit: Payer: Self-pay | Admitting: Family Medicine

## 2019-09-10 ENCOUNTER — Other Ambulatory Visit: Payer: Self-pay | Admitting: Family Medicine

## 2019-11-05 ENCOUNTER — Other Ambulatory Visit: Payer: Self-pay | Admitting: Family Medicine

## 2019-11-05 DIAGNOSIS — R69 Illness, unspecified: Secondary | ICD-10-CM | POA: Diagnosis not present

## 2019-11-26 ENCOUNTER — Other Ambulatory Visit: Payer: Self-pay

## 2019-11-26 ENCOUNTER — Ambulatory Visit (INDEPENDENT_AMBULATORY_CARE_PROVIDER_SITE_OTHER): Payer: Medicare HMO | Admitting: Family Medicine

## 2019-11-26 DIAGNOSIS — E119 Type 2 diabetes mellitus without complications: Secondary | ICD-10-CM

## 2019-11-26 LAB — HEMOGLOBIN A1C: Hgb A1c MFr Bld: 7.5 % — ABNORMAL HIGH (ref 4.6–6.5)

## 2019-11-29 ENCOUNTER — Other Ambulatory Visit: Payer: Self-pay

## 2019-11-29 MED ORDER — GLIPIZIDE ER 10 MG PO TB24: 10.0000 mg | ORAL_TABLET | Freq: Every day | ORAL | 3 refills | Status: DC

## 2019-12-03 ENCOUNTER — Other Ambulatory Visit: Payer: Self-pay | Admitting: Family Medicine

## 2019-12-11 DIAGNOSIS — H25812 Combined forms of age-related cataract, left eye: Secondary | ICD-10-CM | POA: Diagnosis not present

## 2019-12-11 DIAGNOSIS — E119 Type 2 diabetes mellitus without complications: Secondary | ICD-10-CM | POA: Diagnosis not present

## 2019-12-11 DIAGNOSIS — H52203 Unspecified astigmatism, bilateral: Secondary | ICD-10-CM | POA: Diagnosis not present

## 2019-12-11 DIAGNOSIS — H04123 Dry eye syndrome of bilateral lacrimal glands: Secondary | ICD-10-CM | POA: Diagnosis not present

## 2019-12-11 DIAGNOSIS — H4322 Crystalline deposits in vitreous body, left eye: Secondary | ICD-10-CM | POA: Diagnosis not present

## 2019-12-11 DIAGNOSIS — Z961 Presence of intraocular lens: Secondary | ICD-10-CM | POA: Diagnosis not present

## 2019-12-11 DIAGNOSIS — H524 Presbyopia: Secondary | ICD-10-CM | POA: Diagnosis not present

## 2019-12-11 LAB — HM DIABETES EYE EXAM

## 2019-12-13 ENCOUNTER — Encounter: Payer: Self-pay | Admitting: Family Medicine

## 2019-12-24 DIAGNOSIS — R69 Illness, unspecified: Secondary | ICD-10-CM | POA: Diagnosis not present

## 2020-02-12 DIAGNOSIS — R69 Illness, unspecified: Secondary | ICD-10-CM | POA: Diagnosis not present

## 2020-02-28 ENCOUNTER — Other Ambulatory Visit: Payer: Self-pay | Admitting: Family Medicine

## 2020-03-03 ENCOUNTER — Other Ambulatory Visit: Payer: Self-pay | Admitting: Family Medicine

## 2020-03-05 ENCOUNTER — Other Ambulatory Visit: Payer: Self-pay

## 2020-03-05 ENCOUNTER — Ambulatory Visit (INDEPENDENT_AMBULATORY_CARE_PROVIDER_SITE_OTHER): Payer: Medicare HMO | Admitting: Family Medicine

## 2020-03-05 ENCOUNTER — Encounter: Payer: Self-pay | Admitting: Family Medicine

## 2020-03-05 VITALS — BP 137/64 | HR 69 | Temp 97.9°F | Resp 16 | Ht 65.0 in | Wt 205.8 lb

## 2020-03-05 DIAGNOSIS — E119 Type 2 diabetes mellitus without complications: Secondary | ICD-10-CM | POA: Diagnosis not present

## 2020-03-05 DIAGNOSIS — E7800 Pure hypercholesterolemia, unspecified: Secondary | ICD-10-CM

## 2020-03-05 DIAGNOSIS — I10 Essential (primary) hypertension: Secondary | ICD-10-CM | POA: Diagnosis not present

## 2020-03-05 NOTE — Progress Notes (Signed)
OFFICE VISIT  03/05/2020   CC:  Chief Complaint  Patient presents with  . Follow-up    RCI, pt is fasting   HPI:    Patient is a 79 y.o. Caucasian male who presents for 6 mo f/u DM, HTN, HLD. A/P as of last visit: "1) DM 2: control not ideal.  Has not been on Januvia for >6-12 mo. Work on [employer], increasing activity. HbA1c, lytes/cr, and urine microalb/cr today.  2) HLD: pt has persistently elected to decline statin treatment. TLC emphasized.  FLP and hepatic panel today.  3) HTN: The current medical regimen is effective;  continue present plan and medications. Has been off lisinopril now for 6-12 mo and bp's have stayed normal."  INTERIM HX: Last f/u visit his A1c was 8.5% so I added glipizide xl $RemoveBefo'5mg'SQaijNtdzEP$  qd. Hba1c came down to 7.5% on recheck 3 mo later.  I increased glipizide to $RemoveBefo'10mg'XIkFHcDhvuK$  qd at that time. Feeling fine, no probs with med.  DM: Gluc 137 fasting this morning.  Some measurements still 200 or more.  NO postprandial checks.  Says gluc's fasting consistently <150 since getting on glipizide. Admits to mostly sedentary lifestyle.  No hypoglycemia.  Feet:  No burning, tingling, or numbness.  HTN:  No home monitoring since last visit b/c they had consistently been normal on daily home checks for several months.--no longer on any bp med.  ROS: no fevers, no CP, no SOB, no wheezing, no cough, no dizziness, no HAs, no rashes, no melena/hematochezia.  No polyuria or polydipsia.  No myalgias or arthralgias.  No focal weakness, paresthesias, or tremors.  No acute vision or hearing abnormalities. No n/v/d or abd pain.  No palpitations.     Past Medical History:  Diagnosis Date  . Asthma    as child-no mdi use now  . Bifascicular block 2017  . 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  Stable cysts 03/31/17 MRI.  . DM (diabetes mellitus) ([place]) 05/2011  . 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 External hemorrhoids   . Hearing impairment    Occupational noise damage x years.  Has hearing aids AU.  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  Marland Kitchen History of colon polyps 2010  . HTN (hypertension), benign   . Hyperlipidemia 03/2017   Recommended statin 03/2017; pt declined (he has done this multiple times, most recently 09/2017.)  . Liver mass 11/2015   MRI abd equivocal regarding benign/malig--recommended f/u liver u/s with doppler in 3 mo (u/s ordered by Dr. Carlean Purl already)  . 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 Personal history of colonic adenomas 2009; 09/14/2012   Polypectomy 2010 and 2014.  Marland Kitchen Plantar fasciitis    deep tissue laser tx at Dr. Emily Filbert (chiro) helped A LOT!  Marland Kitchen Polycystic kidney disease    Normal renal function as of 11/2015.  Stable on imaging 09/2016--recommended repeat in 6 mo.  Stable bilat cysts on MRI 03/31/17  . 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  . Tachycardia 2017   Echo and event monitor negative.  No recurrence as of cardiology f/u 02/2016.     Past Surgical History:  Procedure Laterality Date  . CIRCUMCISION  age 55  . COLONOSCOPY W/ POLYPECTOMY  2009; 2014   09/2012: tubular adenoma--no high grade dysplasia.  Repeat 09/2017 approx.  Diverticulosis and  small int hem  . DENTAL SURGERY     10+ root canals, several bridges  . EYE SURGERY     Right (s/p traumatic injury to eye)  . TRANSTHORACIC ECHOCARDIOGRAM  07/16/08; 11/2015   EF 55-60%, mild LVH and RVH, impaired relaxation.  2017 moderate LVH, EF 65-70%, grade I DD.  Marland Kitchen US AORTA: SCREENING  11/2015   No aneurism but "ectatic" portion of aorta was noted; repeat u/s in 5 yrs recommended    Outpatient Medications Prior to Visit  Medication Sig Dispense Refill  . Blood Glucose Monitoring Suppl (ONE TOUCH ULTRA SYSTEM KIT) W/DEVICE KIT 1 kit by Does not apply route once. 100 each 3  .  Cholecalciferol (VITAMIN D3 PO) Take 25 mcg by mouth. 2000 iu    . Cyanocobalamin (B-12) 1000 MCG TABS Take 1,000 mcg by mouth daily.    Marland Kitchen glipiZIDE (GLUCOTROL XL) 10 MG 24 hr tablet TAKE 1 TABLET (10 MG TOTAL) BY MOUTH DAILY WITH BREAKFAST. 30 tablet 0  . ibuprofen (ADVIL,MOTRIN) 200 MG tablet Take 400 mg by mouth every 3 (three) hours as needed.    . Magnesium 500 MG TABS Take 2 tablets by mouth daily.     . metFORMIN (GLUCOPHAGE) 1000 MG tablet TAKE 1 TABLET (1,000 MG TOTAL) BY MOUTH 2 TIMES DAILY WITH A MEAL. 60 tablet 0  . Omega-3 Fatty Acids (FISH OIL) 1200 MG CAPS Take 1,200 mg by mouth 2 (two) times daily.    Glory Rosebush DELICA LANCETS MISC Check glucose twice daily 60 each 5  . ONETOUCH ULTRA test strip USE AS DIRECTED TO TEST BLOOD GLUCE TWICE DAILY 100 strip 11  . pioglitazone (ACTOS) 45 MG tablet TAKE 1 TABLET(45 MG) BY MOUTH DAILY 30 tablet 0  . TURMERIC PO Take 1,000 mg by mouth daily.    . Zinc 50 MG CAPS Take 50 mg by mouth daily.    Marland Kitchen nystatin-triamcinolone ointment (MYCOLOG) Apply topically 2 (two) times daily. (Patient not taking: Reported on 02/16/2019) 30 g 2  . senna (SENOKOT) 8.6 MG TABS Take 2 tablets by mouth daily.  (Patient not taking: Reported on 03/05/2020)    . B Complex-C (SUPER B COMPLEX PO) Take 2 tablets by mouth daily. (Patient not taking: Reported on 03/05/2020)     No facility-administered medications prior to visit.    No Known Allergies  ROS As per HPI  PE: Vitals with BMI 03/05/2020 08/29/2019 02/16/2019  Height $Remov'5\' 5"'UNssAh$  $Remove'5\' 5"'tsyPiCS$  5' 8.5"  Weight 205 lbs 13 oz 207 lbs 197 lbs 10 oz  BMI 34.25 58.52 77.8  Systolic 242 353 614  Diastolic 64 84 74  Pulse 69 77 73  O2 sat 94% on RA today  Gen: Alert, well appearing.  Patient is oriented to person, place, time, and situation. AFFECT: pleasant, lucid thought and speech. Foot exam -no swelling, tenderness or skin or vascular lesions. Color and temperature is normal. Sensation is intact but a bit diminished overall  on L plantar surface. Peripheral pulses are palpable. Toenails are normal.   LABS:    Chemistry      Component Value Date/Time   NA 136 08/29/2019 0914   K 4.6 08/29/2019 0914   CL 101 08/29/2019 0914   CO2 30 08/29/2019 0914   BUN 17 08/29/2019 0914   CREATININE 0.84 08/29/2019 0914   CREATININE 0.84 02/16/2019 0826      Component Value Date/Time   CALCIUM 9.2 08/29/2019 0914   ALKPHOS 86 08/29/2019 0914   AST  14 08/29/2019 0914   ALT 15 08/29/2019 0914   BILITOT 0.6 08/29/2019 0914     Lab Results  Component Value Date   HGBA1C 7.5 (H) 11/26/2019   Lab Results  Component Value Date   CHOL 207 (H) 08/29/2019   HDL 49.80 08/29/2019   LDLCALC 137 (H) 08/29/2019   TRIG 103.0 08/29/2019   CHOLHDL 4 08/29/2019    IMPRESSION AND PLAN:  1) DM 2, improved fasting gluc's since getting on glipizide. Feet exam showing just a bit of diminished sensation to monofilament testing today. HbA1c and lytes/cr today.  2) HTN: bp consistently good off med for many months now. Lytes/cr today.  3) HLD: statin ontol, pt does not want to try any further statins. Last LDL 6 mo ago 137, HDL 50, trigs normal.  Diet fair at best, not exercising. Plan repeat lipids 6 mo.  An After Visit Summary was printed and given to the patient.  FOLLOW UP: Return in about 6 months (around 09/05/2020) for routine chronic illness f/u.  Signed:  [name], MD           03/05/2020

## 2020-03-06 LAB — HEMOGLOBIN A1C
Hgb A1c MFr Bld: 6.6 % of total Hgb — ABNORMAL HIGH (ref <5.7)
Mean Plasma Glucose: 143 (calc)
eAG (mmol/L): 7.9 (calc)

## 2020-03-06 LAB — BASIC METABOLIC PANEL
BUN: 16 mg/dL (ref 7–25)
CO2: 25 mmol/L (ref 20–32)
Calcium: 9 mg/dL (ref 8.6–10.3)
Chloride: 103 mmol/L (ref 98–110)
Creat: 0.76 mg/dL (ref 0.70–1.18)
Glucose, Bld: 123 mg/dL — ABNORMAL HIGH (ref 65–99)
Potassium: 4.5 mmol/L (ref 3.5–5.3)
Sodium: 138 mmol/L (ref 135–146)

## 2020-03-25 ENCOUNTER — Other Ambulatory Visit: Payer: Self-pay | Admitting: Family Medicine

## 2020-03-27 ENCOUNTER — Other Ambulatory Visit: Payer: Self-pay | Admitting: Family Medicine

## 2020-04-15 NOTE — Progress Notes (Signed)
Subjective:   Matthew Ramirez is a 79 y.o. male who presents for Medicare Annual/Subsequent preventive examination.   I connected with [place] today by telephone and verified that I am speaking with the correct person using two identifiers. Location patient: home Location provider: work Persons participating in the virtual visit: patient, Marine scientist.    I discussed the limitations, risks, security and privacy concerns of performing an evaluation and management service by telephone and the availability of in person appointments. I also discussed with the patient that there may be a patient responsible charge related to this service. The patient expressed understanding and verbally consented to this telephonic visit.    Interactive audio and video telecommunications were attempted between this provider and patient, however failed, due to patient having technical difficulties OR patient did not have access to video capability.  We continued and completed visit with audio only.  Some vital signs may be absent or patient reported.   Time Spent with patient on telephone encounter: 20 minutes  Review of Systems     Cardiac Risk Factors include: advanced age (>20men, >64 women);diabetes mellitus;dyslipidemia;hypertension;male gender;obesity (BMI >30kg/m2);sedentary lifestyle     Objective:    Today's Vitals   04/16/20 0826  Weight: 205 lb (93 kg)  Height: $Remove'5\' 5"'ZhbYRdE$  (1.651 m)   Body mass index is 34.11 kg/m.  Advanced Directives 04/16/2020 04/03/2018 03/28/2017 04/15/2016 06/12/2015  Does Patient Have a Medical Advance Directive? No No No No No  Would patient like information on creating a medical advance directive? No - Patient declined No - Patient declined No - Patient declined No - patient declined information No - patient declined information    Current Medications (verified) Outpatient Encounter Medications as of 04/16/2020  Medication Sig  . Blood Glucose Monitoring Suppl (ONE TOUCH ULTRA  SYSTEM KIT) W/DEVICE KIT 1 kit by Does not apply route once.  . Cholecalciferol (VITAMIN D3 PO) Take 25 mcg by mouth. 2000 iu  . Cyanocobalamin (B-12) 1000 MCG TABS Take 1,000 mcg by mouth daily.  Marland Kitchen glipiZIDE (GLUCOTROL XL) 10 MG 24 hr tablet TAKE 1 TABLET (10 MG TOTAL) BY MOUTH DAILY WITH BREAKFAST.  Marland Kitchen ibuprofen (ADVIL,MOTRIN) 200 MG tablet Take 400 mg by mouth every 3 (three) hours as needed.  . Magnesium 500 MG TABS Take 2 tablets by mouth daily.   . metFORMIN (GLUCOPHAGE) 1000 MG tablet TAKE 1 TABLET BY MOUTH 2 TIMES DAILY WITH A MEAL.  Marland Kitchen nystatin-triamcinolone ointment (MYCOLOG) Apply topically 2 (two) times daily.  . Omega-3 Fatty Acids (FISH OIL) 1200 MG CAPS Take 1,200 mg by mouth 2 (two) times daily.  Glory Rosebush DELICA LANCETS MISC Check glucose twice daily  . ONETOUCH ULTRA test strip USE AS DIRECTED TO TEST BLOOD GLUCE TWICE DAILY  . pioglitazone (ACTOS) 45 MG tablet TAKE 1 TABLET(45 MG) BY MOUTH DAILY  . senna (SENOKOT) 8.6 MG TABS Take 2 tablets by mouth daily.    . TURMERIC PO Take 1,000 mg by mouth daily.  . Zinc 50 MG CAPS Take 50 mg by mouth daily.   No facility-administered encounter medications on file as of 04/16/2020.    Allergies (verified) Patient has no known allergies.   History: Past Medical History:  Diagnosis Date  . Asthma    as child-no mdi use now  . Bifascicular block 2017  . 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  Stable cysts 03/31/17 MRI.  . DM (diabetes mellitus) ([place]) 05/2011  .  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 External hemorrhoids   . Hearing impairment    Occupational noise damage x years.  Has hearing aids AU.  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  Marland Kitchen History of colon polyps 2010  . HTN (hypertension), benign   . Hyperlipidemia 03/2017   Recommended statin 03/2017; pt declined (he has done this multiple times, most recently  09/2017.)  . Liver mass 11/2015   MRI abd equivocal regarding benign/malig--recommended f/u liver u/s with doppler in 3 mo (u/s ordered by Dr. Carlean Purl already)  . 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 Personal history of colonic adenomas 2009; 09/14/2012   Polypectomy 2010 and 2014.  Marland Kitchen Plantar fasciitis    deep tissue laser tx at Dr. Emily Filbert (chiro) helped A LOT!  Marland Kitchen Polycystic kidney disease    Normal renal function as of 11/2015.  Stable on imaging 09/2016--recommended repeat in 6 mo.  Stable bilat cysts on MRI 03/31/17  . 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  . Tachycardia 2017   Echo and event monitor negative.  No recurrence as of cardiology f/u 02/2016.    Past Surgical History:  Procedure Laterality Date  . CIRCUMCISION  age 89  . COLONOSCOPY W/ POLYPECTOMY  2009; 2014   09/2012: tubular adenoma--no high grade dysplasia.  Repeat 09/2017 approx.  Diverticulosis and small int hem  . DENTAL SURGERY     10+ root canals, several bridges  . EYE SURGERY     Right (s/p traumatic injury to eye)  . TRANSTHORACIC ECHOCARDIOGRAM  07/16/08; 11/2015   EF 55-60%, mild LVH and RVH, impaired relaxation.  2017 moderate LVH, EF 65-70%, grade I DD.  Marland Kitchen US AORTA: SCREENING  11/2015   No aneurism but "ectatic" portion of aorta was noted; repeat u/s in 5 yrs recommended   Family History  Problem Relation Age of Onset  . Cancer Mother        leukemia  . Diabetes Mother   . Cancer Sister        ovarian  . Emphysema Father        smoker  . Diabetes Sister   . Kidney disease Brother   . Heart disease Brother   . Colon cancer Neg Hx   . Rectal cancer Neg Hx   . Stomach cancer Neg Hx   . Pancreatic cancer Neg Hx   . Prostate cancer Neg Hx   . Esophageal cancer Neg Hx   . Liver cancer Neg Hx   . Liver disease Neg Hx    Social History   Socioeconomic History  . Marital status: Married    Spouse name: Not  on file  . Number of children: 4  . Years of education: Not on file  . Highest education level: Not on file  Occupational History  . Occupation: Chief Financial Officer  Tobacco Use  . Smoking status: Former Smoker    Packs/day: 1.00    Years: 20.00    Pack years: 20.00    Types: Cigarettes    Quit date: 07/12/1968    Years since quitting: 51.7  . Smokeless tobacco: Never Used  Substance and Sexual Activity  . Alcohol use: Yes    Alcohol/week: 0.0 standard drinks    Comment: a couple of drinks a week  . Drug use: No  . Sexual activity: Not on file  Other Topics Concern  .  Not on file  Social History Narrative   Married, has 4 grown daughters, 15 grandchildren, and 1 great grandchild.   Occupation: Chief Financial Officer.  Originally from [place], has lived in [place] since age 10 yrs.   Distant tobacco abuse: quit 1970.  Rare ETOH intake, no hx of ETOH abuse.  No drug use.   Walks about 4 miles per day.   Social Determinants of Health   Financial Resource Strain: Low Risk   . Difficulty of Paying Living Expenses: Not hard at all  Food Insecurity: No Food Insecurity  . Worried About Charity fundraiser in the Last Year: Never true  . Ran Out of Food in the Last Year: Never true  Transportation Needs: No Transportation Needs  . Lack of Transportation (Medical): No  . Lack of Transportation (Non-Medical): No  Physical Activity: Inactive  . Days of Exercise per Week: 0 days  . Minutes of Exercise per Session: 0 min  Stress: No Stress Concern Present  . Feeling of Stress : Not at all  Social Connections: Socially Integrated  . Frequency of Communication with Friends and Family: More than three times a week  . Frequency of Social Gatherings with Friends and Family: More than three times a week  . Attends Religious Services: More than 4 times per year  . Active Member of Clubs or Organizations: Yes  . Attends Archivist Meetings: More than 4 times per year   . Marital Status: Married    Tobacco Counseling Counseling given: Not Answered   Clinical Intake:  Pre-visit preparation completed: Yes  Pain : No/denies pain     Nutritional Status: BMI > 30  Obese Nutritional Risks: None Diabetes: Yes CBG done?: No Did pt. bring in CBG monitor from home?: No  How often do you need to have someone help you when you read instructions, pamphlets, or other written materials from your doctor or pharmacy?: 1 - Never What is the last grade level you completed in school?: 12th grade  Diabetes:  Is the patient diabetic?  Yes  If diabetic, was a CBG obtained today?  No  Did the patient bring in their glucometer from home?  No phone visit How often do you monitor your CBG's? Daily.   Financial Strains and Diabetes Management:  Are you having any financial strains with the device, your supplies or your medication? No .  Does the patient want to be seen by Chronic Care Management for management of their diabetes?  No  Would the patient like to be referred to a Nutritionist or for Diabetic Management?  No   Diabetic Exams:  Diabetic Eye Exam: Completed 12/11/2019   Diabetic Foot Exam: Completed 03/05/2020    Interpreter Needed?: No  Comments: Caroleen Hamman LPN   Activities of Daily Living In your present state of health, do you have any difficulty performing the following activities: 04/16/2020  Hearing? Y  Comment hearing aids  Vision? N  Difficulty concentrating or making decisions? N  Walking or climbing stairs? N  Dressing or bathing? N  Doing errands, shopping? N  Preparing Food and eating ? N  Using the Toilet? N  In the past six months, have you accidently leaked urine? N  Do you have problems with loss of bowel control? N  Managing your Medications? N  Managing your Finances? N  Housekeeping or managing your Housekeeping? N  Some recent data might be hidden    Patient Care Team: Tammi Sou, MD as  PCP - General  (Family Medicine) Rutherford Guys, MD as Consulting Physician (Ophthalmology) Renato Shin, MD as Consulting Physician (Endocrinology) Martinique, Peter M, MD as Consulting Physician (Cardiology) Gatha Mayer, MD as Consulting Physician (Gastroenterology) Rosemarie Ax, MD as Consulting Physician (Sports Medicine)  Indicate any recent Medical Services you may have received from other than [facility] providers in the past year (date may be approximate).     Assessment:   This is a routine wellness examination for Rufino.  Hearing/Vision screen  Hearing Screening   '125Hz'$  $Remo'250Hz'uDVVB$'500Hz'$'1000Hz'$'2000Hz'$'3000Hz'$'4000Hz'$'6000Hz'$'8000Hz'$   Right ear:           Left ear:           Comments: Hearing aids  Vision Screening Comments: Wears glasses  Dietary issues and exercise activities discussed: Current Exercise Habits: The patient does not participate in regular exercise at present, Exercise limited by: None identified  Goals    . patient     Maintain current health by staying active.       Depression Screen PHQ 2/9 Scores 04/16/2020 08/29/2019 02/16/2019 04/03/2018 03/28/2017 11/26/2015 06/12/2015  PHQ - 2 Score 0 0 0 0 0 0 0    Fall Risk Fall Risk  04/16/2020 08/29/2019 02/16/2019 04/03/2018 03/28/2017  Falls in the past year? 0 0 0 No No  Number falls in past yr: 0 0 0 - -  Injury with Fall? 0 0 0 - -  Follow up Falls prevention discussed Falls evaluation completed Falls evaluation completed - -    Any stairs in or around the home? Yes  If so, are there any without handrails? No  Home free of loose throw rugs in walkways, pet beds, electrical cords, etc? Yes  Adequate lighting in your home to reduce risk of falls? Yes   ASSISTIVE DEVICES UTILIZED TO PREVENT FALLS:  Life alert? No  Use of a cane, walker or w/c? No  Grab bars in the bathroom? No  Shower chair or bench in shower? No  Elevated toilet seat or a handicapped toilet? No   TIMED UP AND GO:  Was the test performed? No . Phone  visit   Cognitive Function:No cognitive impairment noted MMSE - Mini Mental State Exam 04/03/2018  Orientation to time 5  Orientation to Place 5  Registration 3  Attention/ Calculation 5  Recall 1  Language- name 2 objects 2  Language- repeat 1  Language- follow 3 step command 3  Language- read & follow direction 1  Write a sentence 1  Copy design 1  Total score 28        Immunizations Immunization History  Administered Date(s) Administered  . Tdap 05/04/2011    TDAP status: Up to date   Flu Vaccine status: Declined, Education has been provided regarding the importance of this vaccine but patient still declined. Advised may receive this vaccine at local pharmacy or Health Dept. Aware to provide a copy of the vaccination record if obtained from local pharmacy or Health Dept. Verbalized acceptance and understanding.   Pneumococcal vaccine status: Declined,  Education has been provided regarding the importance of this vaccine but patient still declined. Advised may receive this vaccine at local pharmacy or Health Dept. Aware to provide a copy of the vaccination record if obtained from local pharmacy or Health Dept. Verbalized acceptance and understanding.    Covid-19 vaccine status: Declined, Education has been provided regarding the importance of this vaccine but patient still declined. Advised may receive this vaccine  at local pharmacy or Health Dept.or vaccine clinic. Aware to provide a copy of the vaccination record if obtained from local pharmacy or Health Dept. Verbalized acceptance and understanding.  Qualifies for Shingles Vaccine? Yes   Zostavax completed No   Shingrix Completed?: No.    Education has been provided regarding the importance of this vaccine. Patient has been advised to call insurance company to determine out of pocket expense if they have not yet received this vaccine. Advised may also receive vaccine at local pharmacy or Health Dept. Verbalized acceptance and  understanding.  Screening Tests Health Maintenance  Topic Date Due  . Hepatitis C Screening  Never done  . COVID-19 Vaccine (1) Never done  . PNA vac Low Risk Adult (1 of 2 - PCV13) Never done  . COLONOSCOPY  10/03/2017  . INFLUENZA VACCINE  02/10/2020  . URINE MICROALBUMIN  08/28/2020  . HEMOGLOBIN A1C  09/05/2020  . OPHTHALMOLOGY EXAM  12/10/2020  . FOOT EXAM  03/05/2021  . TETANUS/TDAP  05/03/2021    Health Maintenance  Health Maintenance Due  Topic Date Due  . Hepatitis C Screening  Never done  . COVID-19 Vaccine (1) Never done  . PNA vac Low Risk Adult (1 of 2 - PCV13) Never done  . COLONOSCOPY  10/03/2017  . INFLUENZA VACCINE  02/10/2020    Colorectal cancer screening: No longer required.   Lung Cancer Screening: (Low Dose CT Chest recommended if Age 36-80 years, 30 pack-year currently smoking OR have quit w/in 15years.) does not qualify.   Additional Screening:  Hepatitis C Screening: does not qualify  Vision Screening: Recommended annual ophthalmology exams for early detection of glaucoma and other disorders of the eye. Is the patient up to date with their annual eye exam?  Yes  Who is the provider or what is the name of the office in which the patient attends annual eye exams? Dr. Gershon Crane   Dental Screening: Recommended annual dental exams for proper oral hygiene  Community Resource Referral / Chronic Care Management: CRR required this visit?  No   CCM required this visit?  No      Plan:     I have personally reviewed and noted the following in the patient's chart:   . Medical and social history . Use of alcohol, tobacco or illicit drugs  . Current medications and supplements . Functional ability and status . Nutritional status . Physical activity . Advanced directives . List of other physicians . Hospitalizations, surgeries, and ER visits in previous 12 months . Vitals . Screenings to include cognitive, depression, and falls . Referrals and  appointments  In addition, I have reviewed and discussed with patient certain preventive protocols, quality metrics, and best practice recommendations. A written personalized care plan for preventive services as well as general preventive health recommendations were provided to patient.  Due to this being a telephonic visit, the after visit summary with patients personalized plan was offered to patient via mail or my-chart.  Per request, copy mailed to patient.     Marta Antu, LPN   16/07/958  Nurse Health Advisor  Nurse Notes: None

## 2020-04-16 ENCOUNTER — Ambulatory Visit (INDEPENDENT_AMBULATORY_CARE_PROVIDER_SITE_OTHER): Payer: Medicare HMO

## 2020-04-16 ENCOUNTER — Other Ambulatory Visit: Payer: Self-pay | Admitting: Family Medicine

## 2020-04-16 VITALS — Ht 65.0 in | Wt 205.0 lb

## 2020-04-16 DIAGNOSIS — Z0000 Encounter for general adult medical examination without abnormal findings: Secondary | ICD-10-CM | POA: Diagnosis not present

## 2020-04-16 NOTE — Patient Instructions (Signed)
Matthew Ramirez , Thank you for taking time to complete your Medicare Wellness Visit. I appreciate your ongoing commitment to your health goals. Please review the following plan we discussed and let me know if I can assist you in the future.   Screening recommendations/referrals: Colonoscopy: No longer indicated Recommended yearly ophthalmology/optometry visit for glaucoma screening and checkup Recommended yearly dental visit for hygiene and checkup  Vaccinations: Influenza vaccine: Declined Pneumococcal vaccine: declined Tdap vaccine: Up to Date-05/03/2021 Shingles vaccine: Declined  Covid-19:Declined  Advanced directives: Declined information  Conditions/risks identified: See problem list  Next appointment: Follow up in one year for your annual wellness visit. 04/22/2021 @ 8:15am  Preventive Care 77 Years and Older, Male Preventive care refers to lifestyle choices and visits with your health care provider that can promote health and wellness. What does preventive care include?  A yearly physical exam. This is also called an annual well check.  Dental exams once or twice a year.  Routine eye exams. Ask your health care provider how often you should have your eyes checked.  Personal lifestyle choices, including:  Daily care of your teeth and gums.  Regular physical activity.  Eating a healthy diet.  Avoiding tobacco and drug use.  Limiting alcohol use.  Practicing safe sex.  Taking low doses of aspirin every day.  Taking vitamin and mineral supplements as recommended by your health care provider. What happens during an annual well check? The services and screenings done by your health care provider during your annual well check will depend on your age, overall health, lifestyle risk factors, and family history of disease. Counseling  Your health care provider may ask you questions about your:  Alcohol use.  Tobacco use.  Drug use.  Emotional  well-being.  Home and relationship well-being.  Sexual activity.  Eating habits.  History of falls.  Memory and ability to understand (cognition).  Work and work Statistician. Screening  You may have the following tests or measurements:  Height, weight, and BMI.  Blood pressure.  Lipid and cholesterol levels. These may be checked every 5 years, or more frequently if you are over 92 years old.  Skin check.  Lung cancer screening. You may have this screening every year starting at age 27 if you have a 30-pack-year history of smoking and currently smoke or have quit within the past 15 years.  Fecal occult blood test (FOBT) of the stool. You may have this test every year starting at age 65.  Flexible sigmoidoscopy or colonoscopy. You may have a sigmoidoscopy every 5 years or a colonoscopy every 10 years starting at age 55.  Prostate cancer screening. Recommendations will vary depending on your family history and other risks.  Hepatitis C blood test.  Hepatitis B blood test.  Sexually transmitted disease (STD) testing.  Diabetes screening. This is done by checking your blood sugar (glucose) after you have not eaten for a while (fasting). You may have this done every 1-3 years.  Abdominal aortic aneurysm (AAA) screening. You may need this if you are a current or former smoker.  Osteoporosis. You may be screened starting at age 92 if you are at high risk. Talk with your health care provider about your test results, treatment options, and if necessary, the need for more tests. Vaccines  Your health care provider may recommend certain vaccines, such as:  Influenza vaccine. This is recommended every year.  Tetanus, diphtheria, and acellular pertussis (Tdap, Td) vaccine. You may need a Td booster every 10 years.  Zoster vaccine. You may need this after age 67.  Pneumococcal 13-valent conjugate (PCV13) vaccine. One dose is recommended after age 36.  Pneumococcal  polysaccharide (PPSV23) vaccine. One dose is recommended after age 70. Talk to your health care provider about which screenings and vaccines you need and how often you need them. This information is not intended to replace advice given to you by your health care provider. Make sure you discuss any questions you have with your health care provider. Document Released: 07/25/2015 Document Revised: 03/17/2016 Document Reviewed: 04/29/2015 Elsevier Interactive Patient Education  2017 [place] Prevention in the Home Falls can cause injuries. They can happen to people of all ages. There are many things you can do to make your home safe and to help prevent falls. What can I do on the outside of my home?  Regularly fix the edges of walkways and driveways and fix any cracks.  Remove anything that might make you trip as you walk through a door, such as a raised step or threshold.  Trim any bushes or trees on the path to your home.  Use bright outdoor lighting.  Clear any walking paths of anything that might make someone trip, such as rocks or tools.  Regularly check to see if handrails are loose or broken. Make sure that both sides of any steps have handrails.  Any raised decks and porches should have guardrails on the edges.  Have any leaves, snow, or ice cleared regularly.  Use sand or salt on walking paths during winter.  Clean up any spills in your garage right away. This includes oil or grease spills. What can I do in the bathroom?  Use night lights.  Install grab bars by the toilet and in the tub and shower. Do not use towel bars as grab bars.  Use non-skid mats or decals in the tub or shower.  If you need to sit down in the shower, use a plastic, non-slip stool.  Keep the floor dry. Clean up any water that spills on the floor as soon as it happens.  Remove soap buildup in the tub or shower regularly.  Attach bath mats securely with double-sided non-slip rug  tape.  Do not have throw rugs and other things on the floor that can make you trip. What can I do in the bedroom?  Use night lights.  Make sure that you have a light by your bed that is easy to reach.  Do not use any sheets or blankets that are too big for your bed. They should not hang down onto the floor.  Have a firm chair that has side arms. You can use this for support while you get dressed.  Do not have throw rugs and other things on the floor that can make you trip. What can I do in the kitchen?  Clean up any spills right away.  Avoid walking on wet floors.  Keep items that you use a lot in easy-to-reach places.  If you need to reach something above you, use a strong step stool that has a grab bar.  Keep electrical cords out of the way.  Do not use floor polish or wax that makes floors slippery. If you must use wax, use non-skid floor wax.  Do not have throw rugs and other things on the floor that can make you trip. What can I do with my stairs?  Do not leave any items on the stairs.  Make sure that there are  handrails on both sides of the stairs and use them. Fix handrails that are broken or loose. Make sure that handrails are as long as the stairways.  Check any carpeting to make sure that it is firmly attached to the stairs. Fix any carpet that is loose or worn.  Avoid having throw rugs at the top or bottom of the stairs. If you do have throw rugs, attach them to the floor with carpet tape.  Make sure that you have a light switch at the top of the stairs and the bottom of the stairs. If you do not have them, ask someone to add them for you. What else can I do to help prevent falls?  Wear shoes that:  Do not have high heels.  Have rubber bottoms.  Are comfortable and fit you well.  Are closed at the toe. Do not wear sandals.  If you use a stepladder:  Make sure that it is fully opened. Do not climb a closed stepladder.  Make sure that both sides of the  stepladder are locked into place.  Ask someone to hold it for you, if possible.  Clearly mark and make sure that you can see:  Any grab bars or handrails.  First and last steps.  Where the edge of each step is.  Use tools that help you move around (mobility aids) if they are needed. These include:  Canes.  Walkers.  Scooters.  Crutches.  Turn on the lights when you go into a dark area. Replace any light bulbs as soon as they burn out.  Set up your furniture so you have a clear path. Avoid moving your furniture around.  If any of your floors are uneven, fix them.  If there are any pets around you, be aware of where they are.  Review your medicines with your doctor. Some medicines can make you feel dizzy. This can increase your chance of falling. Ask your doctor what other things that you can do to help prevent falls. This information is not intended to replace advice given to you by your health care provider. Make sure you discuss any questions you have with your health care provider. Document Released: 04/24/2009 Document Revised: 12/04/2015 Document Reviewed: 08/02/2014 Elsevier Interactive Patient Education  2017 [employer].

## 2020-04-22 ENCOUNTER — Other Ambulatory Visit: Payer: Self-pay | Admitting: Family Medicine

## 2020-05-06 DIAGNOSIS — R69 Illness, unspecified: Secondary | ICD-10-CM | POA: Diagnosis not present

## 2020-06-26 DIAGNOSIS — R69 Illness, unspecified: Secondary | ICD-10-CM | POA: Diagnosis not present

## 2020-07-11 DIAGNOSIS — J1282 Pneumonia due to coronavirus disease 2019: Secondary | ICD-10-CM | POA: Diagnosis not present

## 2020-07-11 DIAGNOSIS — I10 Essential (primary) hypertension: Secondary | ICD-10-CM | POA: Diagnosis not present

## 2020-07-11 DIAGNOSIS — Z79899 Other long term (current) drug therapy: Secondary | ICD-10-CM | POA: Diagnosis not present

## 2020-07-11 DIAGNOSIS — T380X5A Adverse effect of glucocorticoids and synthetic analogues, initial encounter: Secondary | ICD-10-CM | POA: Diagnosis not present

## 2020-07-11 DIAGNOSIS — E1165 Type 2 diabetes mellitus with hyperglycemia: Secondary | ICD-10-CM | POA: Diagnosis not present

## 2020-07-11 DIAGNOSIS — U071 COVID-19: Secondary | ICD-10-CM | POA: Diagnosis not present

## 2020-07-11 DIAGNOSIS — E785 Hyperlipidemia, unspecified: Secondary | ICD-10-CM | POA: Diagnosis not present

## 2020-07-11 DIAGNOSIS — J9601 Acute respiratory failure with hypoxia: Secondary | ICD-10-CM | POA: Diagnosis not present

## 2020-07-11 DIAGNOSIS — Z7984 Long term (current) use of oral hypoglycemic drugs: Secondary | ICD-10-CM | POA: Diagnosis not present

## 2020-07-11 DIAGNOSIS — D6869 Other thrombophilia: Secondary | ICD-10-CM | POA: Diagnosis not present

## 2020-07-11 LAB — HEMOGLOBIN A1C: Hemoglobin A1C: 7.8

## 2020-07-12 DIAGNOSIS — J984 Other disorders of lung: Secondary | ICD-10-CM | POA: Diagnosis not present

## 2020-07-12 DIAGNOSIS — J1282 Pneumonia due to coronavirus disease 2019: Secondary | ICD-10-CM | POA: Diagnosis not present

## 2020-07-12 DIAGNOSIS — J9601 Acute respiratory failure with hypoxia: Secondary | ICD-10-CM | POA: Diagnosis not present

## 2020-07-12 DIAGNOSIS — R5381 Other malaise: Secondary | ICD-10-CM | POA: Diagnosis not present

## 2020-07-12 DIAGNOSIS — U071 COVID-19: Secondary | ICD-10-CM | POA: Diagnosis not present

## 2020-07-12 DIAGNOSIS — D6869 Other thrombophilia: Secondary | ICD-10-CM | POA: Diagnosis not present

## 2020-07-12 DIAGNOSIS — I10 Essential (primary) hypertension: Secondary | ICD-10-CM | POA: Diagnosis not present

## 2020-07-12 DIAGNOSIS — I517 Cardiomegaly: Secondary | ICD-10-CM | POA: Diagnosis not present

## 2020-07-12 DIAGNOSIS — E785 Hyperlipidemia, unspecified: Secondary | ICD-10-CM | POA: Diagnosis not present

## 2020-07-12 DIAGNOSIS — T380X5A Adverse effect of glucocorticoids and synthetic analogues, initial encounter: Secondary | ICD-10-CM | POA: Diagnosis not present

## 2020-07-12 DIAGNOSIS — R918 Other nonspecific abnormal finding of lung field: Secondary | ICD-10-CM | POA: Diagnosis not present

## 2020-07-12 DIAGNOSIS — J159 Unspecified bacterial pneumonia: Secondary | ICD-10-CM | POA: Diagnosis not present

## 2020-07-12 DIAGNOSIS — R7989 Other specified abnormal findings of blood chemistry: Secondary | ICD-10-CM | POA: Diagnosis not present

## 2020-07-12 DIAGNOSIS — E119 Type 2 diabetes mellitus without complications: Secondary | ICD-10-CM | POA: Diagnosis not present

## 2020-07-12 DIAGNOSIS — Z7984 Long term (current) use of oral hypoglycemic drugs: Secondary | ICD-10-CM | POA: Diagnosis not present

## 2020-07-12 DIAGNOSIS — E1165 Type 2 diabetes mellitus with hyperglycemia: Secondary | ICD-10-CM | POA: Diagnosis not present

## 2020-07-12 DIAGNOSIS — R0602 Shortness of breath: Secondary | ICD-10-CM | POA: Diagnosis not present

## 2020-07-13 DIAGNOSIS — J9601 Acute respiratory failure with hypoxia: Secondary | ICD-10-CM | POA: Insufficient documentation

## 2020-07-13 DIAGNOSIS — J1282 Pneumonia due to coronavirus disease 2019: Secondary | ICD-10-CM | POA: Diagnosis not present

## 2020-07-13 DIAGNOSIS — E1165 Type 2 diabetes mellitus with hyperglycemia: Secondary | ICD-10-CM | POA: Diagnosis not present

## 2020-07-13 DIAGNOSIS — U071 COVID-19: Secondary | ICD-10-CM | POA: Diagnosis not present

## 2020-07-14 DIAGNOSIS — D6869 Other thrombophilia: Secondary | ICD-10-CM | POA: Diagnosis not present

## 2020-07-14 DIAGNOSIS — E1165 Type 2 diabetes mellitus with hyperglycemia: Secondary | ICD-10-CM | POA: Diagnosis not present

## 2020-07-14 DIAGNOSIS — U071 COVID-19: Secondary | ICD-10-CM | POA: Diagnosis not present

## 2020-07-14 DIAGNOSIS — J1282 Pneumonia due to coronavirus disease 2019: Secondary | ICD-10-CM | POA: Diagnosis not present

## 2020-07-14 DIAGNOSIS — R5381 Other malaise: Secondary | ICD-10-CM | POA: Diagnosis not present

## 2020-07-14 DIAGNOSIS — J9601 Acute respiratory failure with hypoxia: Secondary | ICD-10-CM | POA: Diagnosis not present

## 2020-07-15 DIAGNOSIS — E1165 Type 2 diabetes mellitus with hyperglycemia: Secondary | ICD-10-CM | POA: Diagnosis not present

## 2020-07-15 DIAGNOSIS — U071 COVID-19: Secondary | ICD-10-CM | POA: Diagnosis not present

## 2020-07-15 DIAGNOSIS — R5381 Other malaise: Secondary | ICD-10-CM | POA: Diagnosis not present

## 2020-07-15 DIAGNOSIS — J1282 Pneumonia due to coronavirus disease 2019: Secondary | ICD-10-CM | POA: Diagnosis not present

## 2020-07-15 DIAGNOSIS — J9601 Acute respiratory failure with hypoxia: Secondary | ICD-10-CM | POA: Diagnosis not present

## 2020-07-15 DIAGNOSIS — D6869 Other thrombophilia: Secondary | ICD-10-CM | POA: Diagnosis not present

## 2020-07-16 DIAGNOSIS — R5381 Other malaise: Secondary | ICD-10-CM | POA: Diagnosis not present

## 2020-07-16 DIAGNOSIS — E1165 Type 2 diabetes mellitus with hyperglycemia: Secondary | ICD-10-CM | POA: Diagnosis not present

## 2020-07-16 DIAGNOSIS — D6869 Other thrombophilia: Secondary | ICD-10-CM | POA: Diagnosis not present

## 2020-07-16 DIAGNOSIS — J9601 Acute respiratory failure with hypoxia: Secondary | ICD-10-CM | POA: Diagnosis not present

## 2020-07-16 DIAGNOSIS — U071 COVID-19: Secondary | ICD-10-CM | POA: Diagnosis not present

## 2020-07-16 DIAGNOSIS — J1282 Pneumonia due to coronavirus disease 2019: Secondary | ICD-10-CM | POA: Diagnosis not present

## 2020-07-16 DIAGNOSIS — J159 Unspecified bacterial pneumonia: Secondary | ICD-10-CM | POA: Diagnosis not present

## 2020-07-17 DIAGNOSIS — J9601 Acute respiratory failure with hypoxia: Secondary | ICD-10-CM | POA: Diagnosis not present

## 2020-07-17 DIAGNOSIS — E1165 Type 2 diabetes mellitus with hyperglycemia: Secondary | ICD-10-CM | POA: Diagnosis not present

## 2020-07-17 DIAGNOSIS — U071 COVID-19: Secondary | ICD-10-CM | POA: Diagnosis not present

## 2020-07-17 DIAGNOSIS — J1282 Pneumonia due to coronavirus disease 2019: Secondary | ICD-10-CM | POA: Diagnosis not present

## 2020-07-17 DIAGNOSIS — D6869 Other thrombophilia: Secondary | ICD-10-CM | POA: Diagnosis not present

## 2020-07-18 DIAGNOSIS — E1165 Type 2 diabetes mellitus with hyperglycemia: Secondary | ICD-10-CM | POA: Diagnosis not present

## 2020-07-18 DIAGNOSIS — J9601 Acute respiratory failure with hypoxia: Secondary | ICD-10-CM | POA: Diagnosis not present

## 2020-07-18 DIAGNOSIS — R0602 Shortness of breath: Secondary | ICD-10-CM | POA: Diagnosis not present

## 2020-07-18 DIAGNOSIS — J1282 Pneumonia due to coronavirus disease 2019: Secondary | ICD-10-CM | POA: Diagnosis not present

## 2020-07-18 DIAGNOSIS — J984 Other disorders of lung: Secondary | ICD-10-CM | POA: Diagnosis not present

## 2020-07-18 DIAGNOSIS — U071 COVID-19: Secondary | ICD-10-CM | POA: Diagnosis not present

## 2020-07-18 DIAGNOSIS — D6869 Other thrombophilia: Secondary | ICD-10-CM | POA: Diagnosis not present

## 2020-07-19 DIAGNOSIS — U071 COVID-19: Secondary | ICD-10-CM | POA: Diagnosis not present

## 2020-07-19 DIAGNOSIS — D6869 Other thrombophilia: Secondary | ICD-10-CM | POA: Diagnosis not present

## 2020-07-19 DIAGNOSIS — E1165 Type 2 diabetes mellitus with hyperglycemia: Secondary | ICD-10-CM | POA: Diagnosis not present

## 2020-07-19 DIAGNOSIS — J9601 Acute respiratory failure with hypoxia: Secondary | ICD-10-CM | POA: Diagnosis not present

## 2020-07-19 DIAGNOSIS — J1282 Pneumonia due to coronavirus disease 2019: Secondary | ICD-10-CM | POA: Diagnosis not present

## 2020-07-25 ENCOUNTER — Telehealth: Payer: Self-pay | Admitting: Family Medicine

## 2020-07-25 NOTE — Telephone Encounter (Signed)
Pt had to change Hospital fu appt due to weather, daughter is calling in regards to pt blood thinner medication, states the hospital did not send him home with a refill and she is concerned that pt will be 2 wks without it.dughter is not sure if sh is on the DPR however states if not please call pt and let him know if Dr. Anitra Lauth will send in a rx for the blood thinner. Her name is [employer].

## 2020-07-25 NOTE — Telephone Encounter (Signed)
Spoke with pt regarding daughters concerns of needing a blood thinner due to being on blood thinners during hospitalization. Pt was informed he did not show any signs of PE when had a CXR and would need more of a medical reason to start anticoagulation, such as labs. Pt was also informed that the hospital can prescribe blood thinners to pt at discharge and the provider would have done so during discharge if felt necessary. Pt verbalized understanding and will inform daughter.   FYI [place]

## 2020-07-25 NOTE — Telephone Encounter (Signed)
Pt daughter call in regards to needing a possible blood thinner but not on DPR so told her I would call this pt and discuss

## 2020-07-25 NOTE — Telephone Encounter (Signed)
Please get more information. I reviewed hospitalization records from [facility] medical center 1/1 to 1/8, 2022. There is no mention of any blood thinner or any diagnosis that would require a blood thinner. Can pt or family member give any more specific information about what they are requesting?? -thx!

## 2020-07-25 NOTE — Telephone Encounter (Signed)
Please advise 

## 2020-07-25 NOTE — Telephone Encounter (Signed)
Noted  

## 2020-07-28 ENCOUNTER — Inpatient Hospital Stay: Payer: Medicare HMO | Admitting: Family Medicine

## 2020-07-31 ENCOUNTER — Other Ambulatory Visit: Payer: Self-pay

## 2020-08-01 ENCOUNTER — Other Ambulatory Visit: Payer: Self-pay

## 2020-08-01 ENCOUNTER — Encounter: Payer: Self-pay | Admitting: Family Medicine

## 2020-08-01 ENCOUNTER — Inpatient Hospital Stay: Payer: Medicare HMO | Admitting: Family Medicine

## 2020-08-01 ENCOUNTER — Ambulatory Visit (INDEPENDENT_AMBULATORY_CARE_PROVIDER_SITE_OTHER): Payer: Medicare HMO | Admitting: Family Medicine

## 2020-08-01 VITALS — BP 122/54 | HR 109 | Temp 97.6°F | Resp 16 | Ht 65.0 in | Wt 205.4 lb

## 2020-08-01 DIAGNOSIS — E1165 Type 2 diabetes mellitus with hyperglycemia: Secondary | ICD-10-CM

## 2020-08-01 DIAGNOSIS — E119 Type 2 diabetes mellitus without complications: Secondary | ICD-10-CM

## 2020-08-01 DIAGNOSIS — J1282 Pneumonia due to coronavirus disease 2019: Secondary | ICD-10-CM

## 2020-08-01 DIAGNOSIS — U071 COVID-19: Secondary | ICD-10-CM

## 2020-08-01 DIAGNOSIS — R350 Frequency of micturition: Secondary | ICD-10-CM | POA: Diagnosis not present

## 2020-08-01 DIAGNOSIS — J9601 Acute respiratory failure with hypoxia: Secondary | ICD-10-CM | POA: Diagnosis not present

## 2020-08-01 LAB — POCT URINALYSIS DIPSTICK
Bilirubin, UA: NEGATIVE
Blood, UA: NEGATIVE
Glucose, UA: POSITIVE — AB
Ketones, UA: NEGATIVE
Leukocytes, UA: NEGATIVE
Nitrite, UA: NEGATIVE
Protein, UA: NEGATIVE
Spec Grav, UA: 1.015 (ref 1.010–1.025)
Urobilinogen, UA: 0.2 E.U./dL
pH, UA: 6 (ref 5.0–8.0)

## 2020-08-01 MED ORDER — PIOGLITAZONE HCL 45 MG PO TABS: ORAL_TABLET | ORAL | 3 refills | Status: DC

## 2020-08-01 NOTE — Progress Notes (Signed)
08/01/2020  CC:  Chief Complaint  Patient presents with  . Hospitalization Follow-up    Pt is not fasting    Patient is a 80 y.o. Caucasian male who presents for  hospital follow up, specifically [facility] face-to-face visit. Dates hospitalized: 1/1-1/8, 2022. Days since d/c from hospital: 13 Patient was discharged from hospital to home. Reason for admission to hospital: acute hypoxic RF due to covid pneumonia Date of interactive (phone) contact with patient and/or caregiver: 07/22/20  I have reviewed patient's discharge summary plus pertinent specific notes, labs, and imaging from the hospitalization.   Fatigue onset around 07/06/20, home covid test pos, started checking oxygen sat at home, noted to drop into 80s, slight fever + mild hypoxia in ED, admitted and put on oxygen and steroids and remdesivir.  D-dimer elevated, CTA neg for PE but +multilobar ground glass infiltrates c/w covid 19 pneumonia.  Purulent sputum towards end of hosp so pt was put on augmentin to go home on and also went home to finish oral steroids (decadron 85m qd).  Home on oxygen 6L. Wt on 07/18/20 documented as 199 lbs.  CURRENTLY:  Feeling well!  He no longer requires oxygen: sats consistently >92% on RA. He finished his abx and steroids. Home glucoses since hosp consistently 250+ fasting.  Glucs up in hosp as well, required insulin.  This is compared to 150 or so prior to getting ill.  He does note that he hasn't taken pioglit in the last few months. Has frequent urination but no unusual urgency and no dysuria.  No excessive thirst but family has been good at pushing him to drink lots of clear fluids. No prob with urinary frequency prior to recent illness.  Medication reconciliation was done today and patient is taking meds as recommended by discharging hospitalist/specialist.    PMH:  Past Medical History:  Diagnosis Date  . Asthma    as child-no mdi use now  . Bifascicular block 2017  .  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  Stable cysts 03/31/17 MRI.  . [place]  . 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 External hemorrhoids   . Hearing impairment    Occupational noise damage x years.  Has hearing aids AU.  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  .Marland KitchenHistory of colon polyps 2010  . HTN (hypertension), benign   . Hyperlipidemia 03/2017   Recommended statin 03/2017; pt declined (he has done this multiple times, most recently 09/2017.)  . Liver mass 11/2015   MRI abd equivocal regarding benign/malig--recommended f/u liver u/s with doppler in 3 mo (u/s ordered by Dr. GCarlean Purlalready)  . 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 Personal history of colonic adenomas 2009; 09/14/2012   Polypectomy 2010 and 2014.  .Marland KitchenPlantar fasciitis    deep tissue laser tx at Dr. BEmily Filbert(chiro) helped A LOT!  .Marland KitchenPolycystic kidney disease    Normal renal function as of 11/2015.  Stable on imaging 09/2016--recommended repeat in 6 mo.  Stable bilat cysts on MRI 03/31/17  . 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  . Tachycardia 2017   Echo and event monitor negative.  No recurrence as of cardiology f/u 02/2016.     PSH:  Past Surgical History:  Procedure Laterality Date  . CIRCUMCISION  age 80 .  COLONOSCOPY W/ POLYPECTOMY  2009; 2014   09/2012: tubular adenoma--no high grade dysplasia.  Repeat 09/2017 approx.  Diverticulosis and small int hem  . DENTAL SURGERY     10+ root canals, several bridges  . EYE SURGERY     Right (s/p traumatic injury to eye)  . TRANSTHORACIC ECHOCARDIOGRAM  07/16/08; 11/2015   EF 55-60%, mild LVH and RVH, impaired relaxation.  2017 moderate LVH, EF 65-70%, grade I DD.  Marland Kitchen US AORTA: SCREENING  11/2015   No aneurism but "ectatic" portion of aorta was noted;  repeat u/s in 5 yrs recommended    MEDS:  Outpatient Medications Prior to Visit  Medication Sig Dispense Refill  . Blood Glucose Monitoring Suppl (ONE TOUCH ULTRA SYSTEM KIT) W/DEVICE KIT 1 kit by Does not apply route once. 100 each 3  . Cholecalciferol (VITAMIN D3 PO) Take 25 mcg by mouth. 2000 iu    . Cyanocobalamin (B-12) 1000 MCG TABS Take 1,000 mcg by mouth daily.    Marland Kitchen glipiZIDE (GLUCOTROL XL) 10 MG 24 hr tablet TAKE 1 TABLET (10 MG TOTAL) BY MOUTH DAILY WITH BREAKFAST. 90 tablet 1  . ibuprofen (ADVIL,MOTRIN) 200 MG tablet Take 400 mg by mouth every 3 (three) hours as needed.    . Magnesium 500 MG TABS Take 2 tablets by mouth daily.     . metFORMIN (GLUCOPHAGE) 1000 MG tablet TAKE 1 TABLET BY MOUTH 2 TIMES DAILY WITH A MEAL. 180 tablet 1  . Omega-3 Fatty Acids (FISH OIL) 1200 MG CAPS Take 1,200 mg by mouth 2 (two) times daily.    Glory Rosebush DELICA LANCETS MISC Check glucose twice daily 60 each 5  . ONETOUCH ULTRA test strip USE AS DIRECTED TO TEST BLOOD GLUCE TWICE DAILY 100 strip 11  . senna (SENOKOT) 8.6 MG TABS Take 2 tablets by mouth daily.      . TURMERIC PO Take 1,000 mg by mouth daily.    . Zinc 50 MG CAPS Take 50 mg by mouth daily.    Marland Kitchen albuterol (VENTOLIN HFA) 108 (90 Base) MCG/ACT inhaler Inhale into the lungs. (Patient not taking: Reported on 08/01/2020)    . nystatin-triamcinolone ointment (MYCOLOG) Apply topically 2 (two) times daily. (Patient not taking: Reported on 08/01/2020) 30 g 2  . pioglitazone (ACTOS) 45 MG tablet TAKE 1 TABLET(45 MG) BY MOUTH DAILY (Patient not taking: Reported on 08/01/2020) 30 tablet 0   No facility-administered medications prior to visit.   EXAM: Vitals with BMI 08/01/2020 04/16/2020 03/05/2020  Height _0  _1  _2   Weight 205 lbs 6 oz 205 lbs 205 lbs 13 oz  BMI 34.18 10.62 69.48  Systolic 546 - 270  Diastolic 54 - 64  Pulse 350 - 69  O2 sat 92% on RA today  Gen: Alert, well appearing.  Patient is oriented to person, place, time, and  situation. AFFECT: pleasant, lucid thought and speech. CV: RRR, no m/r/g.   LUNGS: CTA bilat, nonlabored resps, good aeration in all lung fields. EXT: no clubbing or cyanosis.  2+ bilat LL pitting edema.    Pertinent labs/imaging   Chemistry      Component Value Date/Time   NA 138 03/05/2020 0914   K 4.5 03/05/2020 0914   CL 103 03/05/2020 0914   CO2 25 03/05/2020 0914   BUN 16 03/05/2020 0914   CREATININE 0.76 03/05/2020 0914      Component Value Date/Time   CALCIUM 9.0 03/05/2020 0914   ALKPHOS 86 08/29/2019 0914   AST  14 08/29/2019 0914   ALT 15 08/29/2019 0914   BILITOT 0.6 08/29/2019 0914     Lab Results  Component Value Date   HGBA1C 7.8 07/11/2020   Recent Labs  Lab 07/18/20 0357  WBC 10.3  HGB 13.7  HCT 40.5  PLT 251   Recent Labs  Lab 07/18/20 0357  NA 135*  K 4.3  CL 99  CO2 23  BUN 20  CREATININE 0.68*  CALCIUM 8.6   Recent Labs  Lab 07/13/20 0231  MAGNESIUM 2.4   Recent Labs  Lab 07/12/20 0501  HGBA1C 7.8*   Recent Labs  Lab 07/18/20 0357  BILITOT 0.57  AST 17  ALT 30  ALKPHOS 88  ALBUMIN 3.3*    POC CC dipstick UA today: 1000 gluc, o/w normal.  ASSESSMENT/PLAN:  1) Acute hypoxic RF due to covid pneumonia: doing great, no longer requiring oxygen supplementation.  Cont to monitor oxygen sat at home and if no drop of sat to <92% on RA then he's okay to send oxygen back next week.  2) DM, with hyperglycemia : his control had been acceptable up until covid illness+ IV and PO steroids given lately for this.  Also has been OFF his pioglitazone for unclear duration but sounds like 1-2 mo.  Get back on pioglit 45 qd and cont full dose metformin and glipizide.   Monitor gluc bid and if not returning to much better levels in 2 wks then we'll start insulin for at least short term.  3) Urinary frequency: secondary to hyperglycemia/glucosuria. No sign of infection. Should gradually improve as glucose control improves.   Medical decision  making of moderate complexity was utilized today.  FOLLOW UP:  2 wks  Signed:  [name], MD           08/01/2020

## 2020-08-01 NOTE — Patient Instructions (Signed)
Restart pioglitazone (rx sent to pharmacy today). Continue to take your glipizide and metformin.  Check sugar fasting daily and also 2H after a meal daily and write numbers down to review with me at f/u in 2 wks.

## 2020-08-02 LAB — BASIC METABOLIC PANEL
BUN: 10 mg/dL (ref 7–25)
CO2: 28 mmol/L (ref 20–32)
Calcium: 9.2 mg/dL (ref 8.6–10.3)
Chloride: 98 mmol/L (ref 98–110)
Creat: 0.72 mg/dL (ref 0.70–1.18)
Glucose, Bld: 342 mg/dL — ABNORMAL HIGH (ref 65–99)
Potassium: 4.2 mmol/L (ref 3.5–5.3)
Sodium: 137 mmol/L (ref 135–146)

## 2020-08-02 LAB — CBC WITH DIFFERENTIAL/PLATELET
Absolute Monocytes: 779 cells/uL (ref 200–950)
Basophils Absolute: 33 cells/uL (ref 0–200)
Basophils Relative: 0.4 %
Eosinophils Absolute: 98 cells/uL (ref 15–500)
Eosinophils Relative: 1.2 %
HCT: 41.4 % (ref 38.5–50.0)
Hemoglobin: 14.3 g/dL (ref 13.2–17.1)
Lymphs Abs: 1443 cells/uL (ref 850–3900)
MCH: 31 pg (ref 27.0–33.0)
MCHC: 34.5 g/dL (ref 32.0–36.0)
MCV: 89.8 fL (ref 80.0–100.0)
MPV: 11.5 fL (ref 7.5–12.5)
Monocytes Relative: 9.5 %
Neutro Abs: 5847 cells/uL (ref 1500–7800)
Neutrophils Relative %: 71.3 %
Platelets: 146 10*3/uL (ref 140–400)
RBC: 4.61 10*6/uL (ref 4.20–5.80)
RDW: 12.8 % (ref 11.0–15.0)
Total Lymphocyte: 17.6 %
WBC: 8.2 10*3/uL (ref 3.8–10.8)

## 2020-08-14 ENCOUNTER — Other Ambulatory Visit: Payer: Self-pay

## 2020-08-15 ENCOUNTER — Encounter: Payer: Self-pay | Admitting: Family Medicine

## 2020-08-15 ENCOUNTER — Other Ambulatory Visit: Payer: Self-pay | Admitting: Family Medicine

## 2020-08-15 ENCOUNTER — Ambulatory Visit (INDEPENDENT_AMBULATORY_CARE_PROVIDER_SITE_OTHER): Payer: Medicare HMO | Admitting: Family Medicine

## 2020-08-15 VITALS — BP 127/73 | HR 77 | Temp 97.8°F | Resp 16 | Ht 65.0 in | Wt 204.6 lb

## 2020-08-15 DIAGNOSIS — E1165 Type 2 diabetes mellitus with hyperglycemia: Secondary | ICD-10-CM | POA: Diagnosis not present

## 2020-08-15 MED ORDER — LANTUS SOLOSTAR 100 UNIT/ML ~~LOC~~ SOPN: PEN_INJECTOR | SUBCUTANEOUS | 3 refills | Status: DC

## 2020-08-15 NOTE — Telephone Encounter (Signed)
Please advise on if ok to change rx due to insurance coverage

## 2020-08-15 NOTE — Progress Notes (Signed)
OFFICE VISIT  08/15/2020  CC:  Chief Complaint  Patient presents with  . Follow-up    DM, covid. Pt is fasting    HPI:    Patient is a 80 y.o. Caucasian male who presents for 2 week f/u DM with recent poor control/hyperglycemia. A/P as of last visit: "1) Acute hypoxic RF due to covid pneumonia: doing great, no longer requiring oxygen supplementation.  Cont to monitor oxygen sat at home and if no drop of sat to <92% on RA then he's okay to send oxygen back next week.  2) DM, with hyperglycemia : his control had been acceptable up until covid illness+ IV and PO steroids given lately for this.  Also has been OFF his pioglitazone for unclear duration but sounds like 1-2 mo.  Get back on pioglit 45 qd and cont full dose metformin and glipizide.   Monitor gluc bid and if not returning to much better levels in 2 wks then we'll start insulin for at least short term.  3) Urinary frequency: secondary to hyperglycemia/glucosuria. No sign of infection. Should gradually improve as glucose control improves."  INTERIM HX: Feeling fine. Has finished oral steroids. Glucoses still high but gradually trending down into upper 100s to mid 200s more consistently. His polyuria is leveling off a little lately.  No SOB or CP or DOE. Energy level still down from illness but getting better gradually.  He hit the butt end (not chain) of his chainsaw against R LL about a week ago when it kicked back.  No signif pain but notes inc swelling of R LL.  Didn't break the skin.  Past Medical History:  Diagnosis Date  . Asthma    as child-no mdi use now  . Bifascicular block 2017  . 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  Stable cysts 03/31/17 MRI.  . DM (diabetes mellitus) ([place]) 05/2011  . 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 External hemorrhoids   . Hearing impairment    Occupational noise damage x years.  Has  hearing aids AU.  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  Marland Kitchen HTN (hypertension), benign   . Hyperlipidemia 03/2017   Recommended statin 03/2017; pt declined (he has done this multiple times, most recently 09/2017.)  . 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 Personal history of colonic adenomas 2009; 09/14/2012   Polypectomy 2010 and 2014.  Marland Kitchen Plantar fasciitis    deep tissue laser tx at Dr. Emily Filbert (chiro) helped A LOT!  Marland Kitchen Polycystic kidney disease    Normal renal function as of 11/2015.  Stable on imaging 09/2016--recommended repeat in 6 mo.  Stable bilat cysts on MRI 03/31/17  . 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  . Tachycardia 2017   Echo and event monitor negative.  No recurrence as of cardiology f/u 02/2016.     Past Surgical History:  Procedure Laterality Date  . CIRCUMCISION  age 60  . COLONOSCOPY W/ POLYPECTOMY  2009; 2014   09/2012: tubular adenoma--no high grade dysplasia.  Repeat 09/2017 approx.  Diverticulosis and small int hem  . DENTAL SURGERY     10+ root canals, several bridges  . EYE SURGERY     Right (s/p traumatic injury to eye)  . TRANSTHORACIC ECHOCARDIOGRAM  07/16/08; 11/2015   EF 55-60%, mild LVH and RVH, impaired relaxation.  2017  moderate LVH, EF 65-70%, grade I DD.  Marland Kitchen US AORTA: SCREENING  11/2015   No aneurism but "ectatic" portion of aorta was noted; repeat u/s in 5 yrs recommended    Outpatient Medications Prior to Visit  Medication Sig Dispense Refill  . Blood Glucose Monitoring Suppl (ONE TOUCH ULTRA SYSTEM KIT) W/DEVICE KIT 1 kit by Does not apply route once. 100 each 3  . Cholecalciferol (VITAMIN D3 PO) Take 25 mcg by mouth. 2000 iu    . Cyanocobalamin (B-12) 1000 MCG TABS Take 1,000 mcg by mouth daily.    Marland Kitchen glipiZIDE (GLUCOTROL XL) 10 MG 24 hr tablet TAKE 1 TABLET (10 MG TOTAL) BY MOUTH DAILY WITH BREAKFAST. 90 tablet 1  . ibuprofen (ADVIL,MOTRIN)  200 MG tablet Take 400 mg by mouth every 3 (three) hours as needed.    . Magnesium 500 MG TABS Take 2 tablets by mouth daily.     . metFORMIN (GLUCOPHAGE) 1000 MG tablet TAKE 1 TABLET BY MOUTH 2 TIMES DAILY WITH A MEAL. 180 tablet 1  . nystatin-triamcinolone ointment (MYCOLOG) Apply topically 2 (two) times daily. 30 g 2  . Omega-3 Fatty Acids (FISH OIL) 1200 MG CAPS Take 1,200 mg by mouth 2 (two) times daily.    Glory Rosebush DELICA LANCETS MISC Check glucose twice daily 60 each 5  . ONETOUCH ULTRA test strip USE AS DIRECTED TO TEST BLOOD GLUCE TWICE DAILY 100 strip 11  . pioglitazone (ACTOS) 45 MG tablet 1 tab po qd 90 tablet 3  . senna (SENOKOT) 8.6 MG TABS Take 2 tablets by mouth daily.      . TURMERIC PO Take 1,000 mg by mouth daily.    . Zinc 50 MG CAPS Take 50 mg by mouth daily.    Marland Kitchen albuterol (VENTOLIN HFA) 108 (90 Base) MCG/ACT inhaler Inhale into the lungs. (Patient not taking: No sig reported)     No facility-administered medications prior to visit.    No Known Allergies  ROS As per HPI  PE: Vitals with BMI 08/15/2020 08/01/2020 04/16/2020  Height _0  _1  _2   Weight 204 lbs 10 oz 205 lbs 6 oz 205 lbs  BMI 34.05 93.81 01.75  Systolic 102 585 -  Diastolic 73 54 -  Pulse 77 109 -     Gen: Alert, well appearing.  Patient is oriented to person, place, time, and situation. AFFECT: pleasant, lucid thought and speech. R calf and ankle circ mildly greater than L. Symmetric 2+ pitting edema.  Minimal TTP over pretibial surface of R LL.  No skin breakdown or ecchymoses.  LABS:    Chemistry      Component Value Date/Time   NA 137 08/01/2020 1111   K 4.2 08/01/2020 1111   CL 98 08/01/2020 1111   CO2 28 08/01/2020 1111   BUN 10 08/01/2020 1111   CREATININE 0.72 08/01/2020 1111      Component Value Date/Time   CALCIUM 9.2 08/01/2020 1111   ALKPHOS 86 08/29/2019 0914   AST 14 08/29/2019 0914   ALT 15 08/29/2019 0914   BILITOT 0.6 08/29/2019 0914     Lab Results   Component Value Date   WBC 8.2 08/01/2020   HGB 14.3 08/01/2020   HCT 41.4 08/01/2020   MCV 89.8 08/01/2020   PLT 146 08/01/2020   Lab Results  Component Value Date   HGBA1C 7.8 07/11/2020     IMPRESSION AND PLAN:  1) DM 2, historically his control has been adequate but not excellent.  Then, he's had some unclear amount of time off actos in last 4-6 mo and this causes some difficulty with assessing control.  Now, has been back on actos 62m qd for 2 wks and has lingering signif elev glucoses after signif covid infection/illness + systemic steroids for same.  Overall, I really think he needs to be on insulin and we've been delaying the inevitable for a while b/c he really just doesn't want to be on this med.  I recommended he start lantus today and he was agreeable to try it.  Start 5 U qhs and titrate by 1 unit qd until fasting gluc is in 100-110 range. Continue metformin 1000 mg bid, glipizide xl 128mqd, and actos 4574md. Next a1c due in 2 mo (after 10/10/20).  2) LE edema: chronic, stable. Venous insuff + actos side effect. Recent R LL contusion as noted in HPI. Reassured.  An After Visit Summary was printed and given to the patient.  FOLLOW UP: Return in about 4 weeks (around 09/12/2020) for f/u DM/insulin.  Signed:  PhiCrissie SicklesD           08/15/2020

## 2020-08-15 NOTE — Telephone Encounter (Signed)
Basaglar pen eRx'd

## 2020-08-19 DIAGNOSIS — U071 COVID-19: Secondary | ICD-10-CM | POA: Diagnosis not present

## 2020-10-21 ENCOUNTER — Other Ambulatory Visit: Payer: Self-pay | Admitting: Family Medicine

## 2020-11-16 ENCOUNTER — Other Ambulatory Visit: Payer: Self-pay | Admitting: Family Medicine

## 2020-12-11 DIAGNOSIS — H04123 Dry eye syndrome of bilateral lacrimal glands: Secondary | ICD-10-CM | POA: Diagnosis not present

## 2020-12-11 DIAGNOSIS — Z961 Presence of intraocular lens: Secondary | ICD-10-CM | POA: Diagnosis not present

## 2020-12-11 DIAGNOSIS — H524 Presbyopia: Secondary | ICD-10-CM | POA: Diagnosis not present

## 2020-12-11 DIAGNOSIS — H52203 Unspecified astigmatism, bilateral: Secondary | ICD-10-CM | POA: Diagnosis not present

## 2020-12-11 DIAGNOSIS — H25812 Combined forms of age-related cataract, left eye: Secondary | ICD-10-CM | POA: Diagnosis not present

## 2020-12-11 DIAGNOSIS — E1136 Type 2 diabetes mellitus with diabetic cataract: Secondary | ICD-10-CM | POA: Diagnosis not present

## 2020-12-11 DIAGNOSIS — Z7984 Long term (current) use of oral hypoglycemic drugs: Secondary | ICD-10-CM | POA: Diagnosis not present

## 2020-12-11 LAB — HM DIABETES EYE EXAM

## 2020-12-14 ENCOUNTER — Other Ambulatory Visit: Payer: Self-pay | Admitting: Family Medicine

## 2021-02-16 ENCOUNTER — Other Ambulatory Visit: Payer: Self-pay | Admitting: Family Medicine

## 2021-02-20 ENCOUNTER — Other Ambulatory Visit: Payer: Self-pay

## 2021-02-20 ENCOUNTER — Encounter: Payer: Self-pay | Admitting: Family Medicine

## 2021-02-20 ENCOUNTER — Ambulatory Visit (INDEPENDENT_AMBULATORY_CARE_PROVIDER_SITE_OTHER): Payer: Medicare HMO | Admitting: Family Medicine

## 2021-02-20 VITALS — BP 131/69 | HR 76 | Temp 97.6°F | Resp 16 | Ht 65.0 in | Wt 214.8 lb

## 2021-02-20 DIAGNOSIS — Z8679 Personal history of other diseases of the circulatory system: Secondary | ICD-10-CM

## 2021-02-20 DIAGNOSIS — E119 Type 2 diabetes mellitus without complications: Secondary | ICD-10-CM

## 2021-02-20 MED ORDER — GLIPIZIDE ER 10 MG PO TB24: 10.0000 mg | ORAL_TABLET | Freq: Every day | ORAL | 3 refills | Status: DC

## 2021-02-20 MED ORDER — ONETOUCH ULTRA VI STRP: ORAL_STRIP | 11 refills | Status: AC

## 2021-02-20 MED ORDER — METFORMIN HCL 1000 MG PO TABS: 1000.0000 mg | ORAL_TABLET | Freq: Two times a day (BID) | ORAL | 3 refills | Status: AC

## 2021-02-20 NOTE — Progress Notes (Signed)
OFFICE VISIT  02/20/2021  CC:  Chief Complaint  Patient presents with   Follow-up    RCI, pt is not fasting    HPI:    Patient is a 80 y.o. Caucasian male who presents for 6 mo f/u DM, h/o HTN, and HLD.  DM:  daily fasting glucose 120-150 for the most part, but >200 last few days. Has been out of glipizide. Eats moderate carb diet.  HTN: has been off med for a while now b/c bp's off med consistently normal.  HLD: has had elevated LDL and has consistently declined statin.  ROS as above, plus--> no fevers, no CP, no SOB, no wheezing, no cough, no dizziness, no HAs, no rashes, no melena/hematochezia.  No polyuria or polydipsia.  No myalgias or arthralgias.  No focal weakness, paresthesias, or tremors.  No acute vision or hearing abnormalities.  No dysuria or unusual/new urinary urgency or frequency.  No recent changes in lower legs. No n/v/d or abd pain.  No palpitations.     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  Stable cysts 03/31/17 MRI.   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  Has hearing aids AU.  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   Plantar fasciitis    deep tissue laser tx at Dr. Emily Filbert (chiro) helped A LOT!   Polycystic kidney disease    Normal renal function as of 11/2015.  Stable on imaging 09/2016--recommended repeat in 6 mo.  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   Tachycardia 2017   Echo and event monitor negative.  No recurrence as of cardiology f/u 02/2016.     Past Surgical History:  Procedure Laterality Date   CIRCUMCISION  age 53   COLONOSCOPY W/ POLYPECTOMY  2009; 2014   09/2012: tubular adenoma--no high grade dysplasia.  Repeat 09/2017 approx.  Diverticulosis and small int hem   DENTAL SURGERY     10+ root canals, several bridges   EYE SURGERY     Right (s/p traumatic injury to eye)   TRANSTHORACIC ECHOCARDIOGRAM  07/16/08; 11/2015   EF 55-60%, mild LVH and RVH, impaired relaxation.  2017 moderate LVH, EF 65-70%, grade I DD.   US AORTA: SCREENING  11/2015   No aneurism but "ectatic" portion of aorta was noted; repeat u/s in 5 yrs recommended    Outpatient Medications Prior to Visit  Medication Sig Dispense Refill   Blood Glucose Monitoring Suppl (ONE TOUCH ULTRA SYSTEM KIT) W/DEVICE KIT 1 kit by Does not apply route once. 100 each 3   Cholecalciferol (VITAMIN D3 PO) Take 25 mcg by mouth. 2000 iu     Cyanocobalamin (B-12) 1000 MCG TABS Take 1,000 mcg by mouth daily.     glipiZIDE (GLUCOTROL XL)  10 MG 24 hr tablet TAKE 1 TABLET (10 MG TOTAL) BY MOUTH DAILY WITH BREAKFAST. 14 tablet 0   ibuprofen (ADVIL,MOTRIN) 200 MG tablet Take 400 mg by mouth every 3 (three) hours as needed.     Insulin Glargine (BASAGLAR KWIKPEN) 100 UNIT/ML 5 Units SQ qhs, increase by 1 unit per day until fasting glucose is in 100-110 range 15 mL 0   Magnesium 500 MG TABS Take 2 tablets by mouth daily.      metFORMIN (GLUCOPHAGE) 1000 MG tablet TAKE 1 TABLET BY MOUTH 2 TIMES DAILY WITH A MEAL. 28 tablet 0   nystatin-triamcinolone ointment (MYCOLOG) Apply topically 2 (two) times daily. 30 g 2   Omega-3 Fatty Acids (FISH OIL) 1200 MG CAPS Take 1,200 mg by mouth 2 (two) times daily.     ONETOUCH DELICA LANCETS MISC Check glucose twice daily 60  each 5   ONETOUCH ULTRA test strip USE AS DIRECTED TO TEST BLOOD GLUCE TWICE DAILY 100 strip 11   pioglitazone (ACTOS) 45 MG tablet 1 tab po qd 90 tablet 3   senna (SENOKOT) 8.6 MG TABS Take 2 tablets by mouth daily.       TURMERIC PO Take 1,000 mg by mouth daily.     Zinc 50 MG CAPS Take 50 mg by mouth daily.     albuterol (VENTOLIN HFA) 108 (90 Base) MCG/ACT inhaler Inhale into the lungs. (Patient not taking: No sig reported)     No facility-administered medications prior to visit.    No Known Allergies  ROS As per HPI  PE: Vitals with BMI 02/20/2021 08/15/2020 08/01/2020  Height $Remov'5\' 5"'FfjDPC$  $Remove'5\' 5"'bzEfjQQ$  $RemoveB'5\' 5"'NhhHbELP$   Weight 214 lbs 13 oz 204 lbs 10 oz 205 lbs 6 oz  BMI 35.74 35.82 51.89  Systolic 842 103 128  Diastolic 69 73 54  Pulse 76 77 109   Gen: Alert, well appearing.  Patient is oriented to person, place, time, and situation. AFFECT: pleasant, lucid thought and speech. CV: RRR, no m/r/g.   LUNGS: CTA bilat, nonlabored resps, good aeration in all lung fields. EXT: no clubbing or cyanosis.  2+ bilat LL pitting edema. Bilat LL hyperpigmentation changes.    LABS:  Lab Results  Component Value Date   TSH 2.45 09/23/2014   Lab Results  Component Value Date   WBC 8.2 08/01/2020   HGB 14.3 08/01/2020   HCT 41.4 08/01/2020   MCV 89.8 08/01/2020   PLT 146 08/01/2020   Lab Results  Component Value Date   CREATININE 0.72 08/01/2020   BUN 10 08/01/2020   NA 137 08/01/2020   K 4.2 08/01/2020   CL 98 08/01/2020   CO2 28 08/01/2020   Lab Results  Component Value Date   ALT 15 08/29/2019   AST 14 08/29/2019   ALKPHOS 86 08/29/2019   BILITOT 0.6 08/29/2019   Lab Results  Component Value Date   CHOL 207 (H) 08/29/2019   Lab Results  Component Value Date   HDL 49.80 08/29/2019   Lab Results  Component Value Date   LDLCALC 137 (H) 08/29/2019   Lab Results  Component Value Date   TRIG 103.0 08/29/2019   Lab Results  Component Value Date   CHOLHDL 4 08/29/2019   Lab  Results  Component Value Date   PSA 0.55 05/11/2011   Lab Results  Component Value Date   HGBA1C 7.8 07/11/2020   IMPRESSION AND PLAN:  1) DM 2, control sounds improved per home glucose measurements. Cont actos 45  qd, metformin 1000 bid, and glipizide xl 10 qd.   He chose not to start the insulin I rx'd last visit. Hba1c and urine microalbumin/cr today.  2) HTN, hx of, no meds at this time. BP fine here today and on home measurements lately.  3) HLD: has had elevated LDL and has consistently declined statin.  An After Visit Summary was printed and given to the patient.  FOLLOW UP: No follow-ups on file.  Signed:  [name], MD           02/20/2021

## 2021-02-21 LAB — HEMOGLOBIN A1C
Hgb A1c MFr Bld: 7.1 % of total Hgb — ABNORMAL HIGH (ref <5.7)
Mean Plasma Glucose: 157 mg/dL
eAG (mmol/L): 8.7 mmol/L

## 2021-02-21 LAB — BASIC METABOLIC PANEL
BUN: 20 mg/dL (ref 7–25)
CO2: 31 mmol/L (ref 20–32)
Calcium: 9.4 mg/dL (ref 8.6–10.3)
Chloride: 102 mmol/L (ref 98–110)
Creat: 0.8 mg/dL (ref 0.70–1.28)
Glucose, Bld: 203 mg/dL — ABNORMAL HIGH (ref 65–99)
Potassium: 4.1 mmol/L (ref 3.5–5.3)
Sodium: 139 mmol/L (ref 135–146)

## 2021-02-21 LAB — MICROALBUMIN / CREATININE URINE RATIO
Creatinine, Urine: 102 mg/dL (ref 20–320)
Microalb Creat Ratio: 21 mcg/mg creat (ref <30)
Microalb, Ur: 2.1 mg/dL

## 2021-04-01 ENCOUNTER — Telehealth: Payer: Self-pay

## 2021-04-01 NOTE — Telephone Encounter (Signed)
Patient called in wanting a ZPACK because he knows he has the flu, advised that we would have to have a virtual appointment. Matthew Ramirez then declined and said never mind and hung up.

## 2021-04-01 NOTE — Telephone Encounter (Signed)
FYI

## 2021-04-02 NOTE — Telephone Encounter (Signed)
Noted  

## 2021-04-22 ENCOUNTER — Ambulatory Visit: Payer: Medicare HMO

## 2021-04-22 ENCOUNTER — Telehealth: Payer: Self-pay | Admitting: *Deleted

## 2021-04-22 NOTE — Telephone Encounter (Signed)
Unable to reach patient for AWV

## 2021-05-08 DIAGNOSIS — E119 Type 2 diabetes mellitus without complications: Secondary | ICD-10-CM | POA: Diagnosis not present

## 2021-05-08 DIAGNOSIS — Z7984 Long term (current) use of oral hypoglycemic drugs: Secondary | ICD-10-CM | POA: Diagnosis not present

## 2021-06-09 DIAGNOSIS — R5383 Other fatigue: Secondary | ICD-10-CM | POA: Diagnosis not present

## 2021-06-09 DIAGNOSIS — E119 Type 2 diabetes mellitus without complications: Secondary | ICD-10-CM | POA: Diagnosis not present

## 2021-06-11 DIAGNOSIS — R5383 Other fatigue: Secondary | ICD-10-CM | POA: Diagnosis not present

## 2021-06-11 DIAGNOSIS — Z7984 Long term (current) use of oral hypoglycemic drugs: Secondary | ICD-10-CM | POA: Diagnosis not present

## 2021-06-11 DIAGNOSIS — E119 Type 2 diabetes mellitus without complications: Secondary | ICD-10-CM | POA: Diagnosis not present

## 2021-08-08 ENCOUNTER — Other Ambulatory Visit: Payer: Self-pay | Admitting: Family Medicine

## 2021-08-27 ENCOUNTER — Encounter: Payer: Medicare HMO | Admitting: Family Medicine

## 2021-08-31 DIAGNOSIS — E291 Testicular hypofunction: Secondary | ICD-10-CM | POA: Diagnosis not present

## 2021-08-31 DIAGNOSIS — E119 Type 2 diabetes mellitus without complications: Secondary | ICD-10-CM | POA: Diagnosis not present

## 2021-09-01 DIAGNOSIS — E119 Type 2 diabetes mellitus without complications: Secondary | ICD-10-CM | POA: Diagnosis not present

## 2021-10-06 ENCOUNTER — Other Ambulatory Visit: Payer: Self-pay | Admitting: Orthopedic Surgery

## 2021-10-06 ENCOUNTER — Other Ambulatory Visit (HOSPITAL_COMMUNITY): Payer: Self-pay | Admitting: Orthopedic Surgery

## 2021-10-06 DIAGNOSIS — M5416 Radiculopathy, lumbar region: Secondary | ICD-10-CM

## 2021-10-06 DIAGNOSIS — M1711 Unilateral primary osteoarthritis, right knee: Secondary | ICD-10-CM

## 2021-10-09 DIAGNOSIS — R634 Abnormal weight loss: Secondary | ICD-10-CM | POA: Diagnosis not present

## 2021-10-12 DIAGNOSIS — R0609 Other forms of dyspnea: Secondary | ICD-10-CM | POA: Diagnosis not present

## 2021-10-12 DIAGNOSIS — E7849 Other hyperlipidemia: Secondary | ICD-10-CM | POA: Diagnosis not present

## 2021-10-12 DIAGNOSIS — R9431 Abnormal electrocardiogram [ECG] [EKG]: Secondary | ICD-10-CM | POA: Diagnosis not present

## 2021-10-12 DIAGNOSIS — R030 Elevated blood-pressure reading, without diagnosis of hypertension: Secondary | ICD-10-CM | POA: Diagnosis not present

## 2021-10-20 DIAGNOSIS — R030 Elevated blood-pressure reading, without diagnosis of hypertension: Secondary | ICD-10-CM | POA: Diagnosis not present

## 2021-10-20 DIAGNOSIS — R9431 Abnormal electrocardiogram [ECG] [EKG]: Secondary | ICD-10-CM | POA: Diagnosis not present

## 2021-10-20 DIAGNOSIS — R918 Other nonspecific abnormal finding of lung field: Secondary | ICD-10-CM | POA: Diagnosis not present

## 2021-10-20 DIAGNOSIS — R0609 Other forms of dyspnea: Secondary | ICD-10-CM | POA: Diagnosis not present

## 2021-10-20 DIAGNOSIS — E7849 Other hyperlipidemia: Secondary | ICD-10-CM | POA: Diagnosis not present

## 2021-11-05 DIAGNOSIS — R9431 Abnormal electrocardiogram [ECG] [EKG]: Secondary | ICD-10-CM | POA: Diagnosis not present

## 2021-11-05 DIAGNOSIS — E7849 Other hyperlipidemia: Secondary | ICD-10-CM | POA: Diagnosis not present

## 2021-11-05 DIAGNOSIS — R0609 Other forms of dyspnea: Secondary | ICD-10-CM | POA: Diagnosis not present

## 2021-11-05 DIAGNOSIS — R030 Elevated blood-pressure reading, without diagnosis of hypertension: Secondary | ICD-10-CM | POA: Diagnosis not present

## 2021-11-06 ENCOUNTER — Other Ambulatory Visit: Payer: Self-pay | Admitting: Family Medicine

## 2021-11-10 DIAGNOSIS — E7849 Other hyperlipidemia: Secondary | ICD-10-CM | POA: Diagnosis not present

## 2021-11-10 DIAGNOSIS — R030 Elevated blood-pressure reading, without diagnosis of hypertension: Secondary | ICD-10-CM | POA: Diagnosis not present

## 2021-11-10 DIAGNOSIS — I429 Cardiomyopathy, unspecified: Secondary | ICD-10-CM | POA: Diagnosis not present

## 2021-11-10 DIAGNOSIS — R55 Syncope and collapse: Secondary | ICD-10-CM | POA: Diagnosis not present

## 2021-11-10 DIAGNOSIS — R9431 Abnormal electrocardiogram [ECG] [EKG]: Secondary | ICD-10-CM | POA: Diagnosis not present

## 2021-11-10 DIAGNOSIS — R0609 Other forms of dyspnea: Secondary | ICD-10-CM | POA: Diagnosis not present

## 2021-11-16 DIAGNOSIS — R0609 Other forms of dyspnea: Secondary | ICD-10-CM | POA: Diagnosis not present

## 2021-11-16 DIAGNOSIS — E1169 Type 2 diabetes mellitus with other specified complication: Secondary | ICD-10-CM | POA: Diagnosis not present

## 2021-11-16 DIAGNOSIS — I428 Other cardiomyopathies: Secondary | ICD-10-CM | POA: Diagnosis not present

## 2021-11-16 DIAGNOSIS — Z7984 Long term (current) use of oral hypoglycemic drugs: Secondary | ICD-10-CM | POA: Diagnosis not present

## 2021-11-16 DIAGNOSIS — E785 Hyperlipidemia, unspecified: Secondary | ICD-10-CM | POA: Diagnosis not present

## 2021-11-16 DIAGNOSIS — Z7982 Long term (current) use of aspirin: Secondary | ICD-10-CM | POA: Diagnosis not present

## 2021-11-16 DIAGNOSIS — R9431 Abnormal electrocardiogram [ECG] [EKG]: Secondary | ICD-10-CM | POA: Diagnosis not present

## 2021-11-16 DIAGNOSIS — I2510 Atherosclerotic heart disease of native coronary artery without angina pectoris: Secondary | ICD-10-CM | POA: Diagnosis not present

## 2021-11-16 DIAGNOSIS — Z79899 Other long term (current) drug therapy: Secondary | ICD-10-CM | POA: Diagnosis not present

## 2021-11-16 DIAGNOSIS — R55 Syncope and collapse: Secondary | ICD-10-CM | POA: Diagnosis not present

## 2021-11-27 DIAGNOSIS — E782 Mixed hyperlipidemia: Secondary | ICD-10-CM | POA: Diagnosis not present

## 2021-11-27 DIAGNOSIS — Z0001 Encounter for general adult medical examination with abnormal findings: Secondary | ICD-10-CM | POA: Diagnosis not present

## 2021-11-27 DIAGNOSIS — Z0000 Encounter for general adult medical examination without abnormal findings: Secondary | ICD-10-CM | POA: Diagnosis not present

## 2021-11-27 DIAGNOSIS — E119 Type 2 diabetes mellitus without complications: Secondary | ICD-10-CM | POA: Diagnosis not present

## 2021-12-08 DIAGNOSIS — E1136 Type 2 diabetes mellitus with diabetic cataract: Secondary | ICD-10-CM | POA: Diagnosis not present

## 2021-12-08 DIAGNOSIS — H25812 Combined forms of age-related cataract, left eye: Secondary | ICD-10-CM | POA: Diagnosis not present

## 2021-12-08 DIAGNOSIS — H524 Presbyopia: Secondary | ICD-10-CM | POA: Diagnosis not present

## 2021-12-08 DIAGNOSIS — H4322 Crystalline deposits in vitreous body, left eye: Secondary | ICD-10-CM | POA: Diagnosis not present

## 2021-12-08 DIAGNOSIS — H52203 Unspecified astigmatism, bilateral: Secondary | ICD-10-CM | POA: Diagnosis not present

## 2021-12-08 DIAGNOSIS — Z7984 Long term (current) use of oral hypoglycemic drugs: Secondary | ICD-10-CM | POA: Diagnosis not present

## 2021-12-08 DIAGNOSIS — Z961 Presence of intraocular lens: Secondary | ICD-10-CM | POA: Diagnosis not present

## 2021-12-11 DIAGNOSIS — I420 Dilated cardiomyopathy: Secondary | ICD-10-CM | POA: Diagnosis not present

## 2021-12-11 DIAGNOSIS — E7849 Other hyperlipidemia: Secondary | ICD-10-CM | POA: Diagnosis not present

## 2021-12-11 DIAGNOSIS — R0609 Other forms of dyspnea: Secondary | ICD-10-CM | POA: Diagnosis not present

## 2021-12-11 DIAGNOSIS — R55 Syncope and collapse: Secondary | ICD-10-CM | POA: Diagnosis not present

## 2021-12-11 DIAGNOSIS — I429 Cardiomyopathy, unspecified: Secondary | ICD-10-CM | POA: Diagnosis not present

## 2021-12-11 DIAGNOSIS — R9431 Abnormal electrocardiogram [ECG] [EKG]: Secondary | ICD-10-CM | POA: Diagnosis not present

## 2021-12-11 DIAGNOSIS — R030 Elevated blood-pressure reading, without diagnosis of hypertension: Secondary | ICD-10-CM | POA: Diagnosis not present

## 2021-12-16 DIAGNOSIS — H1132 Conjunctival hemorrhage, left eye: Secondary | ICD-10-CM | POA: Diagnosis not present

## 2021-12-29 DIAGNOSIS — I420 Dilated cardiomyopathy: Secondary | ICD-10-CM | POA: Diagnosis not present

## 2021-12-29 DIAGNOSIS — I429 Cardiomyopathy, unspecified: Secondary | ICD-10-CM | POA: Diagnosis not present

## 2022-01-04 DIAGNOSIS — E291 Testicular hypofunction: Secondary | ICD-10-CM | POA: Diagnosis not present

## 2022-01-06 DIAGNOSIS — E7849 Other hyperlipidemia: Secondary | ICD-10-CM | POA: Diagnosis not present

## 2022-01-06 DIAGNOSIS — R0609 Other forms of dyspnea: Secondary | ICD-10-CM | POA: Diagnosis not present

## 2022-01-06 DIAGNOSIS — I420 Dilated cardiomyopathy: Secondary | ICD-10-CM | POA: Diagnosis not present

## 2022-01-06 DIAGNOSIS — I429 Cardiomyopathy, unspecified: Secondary | ICD-10-CM | POA: Diagnosis not present

## 2022-01-06 DIAGNOSIS — R9431 Abnormal electrocardiogram [ECG] [EKG]: Secondary | ICD-10-CM | POA: Diagnosis not present

## 2022-01-06 DIAGNOSIS — R0683 Snoring: Secondary | ICD-10-CM | POA: Diagnosis not present

## 2022-01-06 DIAGNOSIS — R030 Elevated blood-pressure reading, without diagnosis of hypertension: Secondary | ICD-10-CM | POA: Diagnosis not present

## 2022-01-06 DIAGNOSIS — R5383 Other fatigue: Secondary | ICD-10-CM | POA: Diagnosis not present

## 2022-01-06 DIAGNOSIS — R55 Syncope and collapse: Secondary | ICD-10-CM | POA: Diagnosis not present

## 2022-01-13 DIAGNOSIS — E291 Testicular hypofunction: Secondary | ICD-10-CM | POA: Diagnosis not present

## 2022-01-15 DIAGNOSIS — E291 Testicular hypofunction: Secondary | ICD-10-CM | POA: Diagnosis not present

## 2022-01-18 DIAGNOSIS — R0683 Snoring: Secondary | ICD-10-CM | POA: Diagnosis not present

## 2022-01-18 DIAGNOSIS — G4710 Hypersomnia, unspecified: Secondary | ICD-10-CM | POA: Diagnosis not present

## 2022-01-19 DIAGNOSIS — G4733 Obstructive sleep apnea (adult) (pediatric): Secondary | ICD-10-CM | POA: Diagnosis not present

## 2022-01-20 DIAGNOSIS — R5383 Other fatigue: Secondary | ICD-10-CM | POA: Diagnosis not present

## 2022-01-20 DIAGNOSIS — R55 Syncope and collapse: Secondary | ICD-10-CM | POA: Diagnosis not present

## 2022-01-20 DIAGNOSIS — R0609 Other forms of dyspnea: Secondary | ICD-10-CM | POA: Diagnosis not present

## 2022-01-20 DIAGNOSIS — R9431 Abnormal electrocardiogram [ECG] [EKG]: Secondary | ICD-10-CM | POA: Diagnosis not present

## 2022-01-20 DIAGNOSIS — E291 Testicular hypofunction: Secondary | ICD-10-CM | POA: Diagnosis not present

## 2022-01-20 DIAGNOSIS — I420 Dilated cardiomyopathy: Secondary | ICD-10-CM | POA: Diagnosis not present

## 2022-01-20 DIAGNOSIS — E7849 Other hyperlipidemia: Secondary | ICD-10-CM | POA: Diagnosis not present

## 2022-01-24 DIAGNOSIS — G4733 Obstructive sleep apnea (adult) (pediatric): Secondary | ICD-10-CM | POA: Diagnosis not present

## 2022-01-28 DIAGNOSIS — Z0000 Encounter for general adult medical examination without abnormal findings: Secondary | ICD-10-CM | POA: Diagnosis not present

## 2022-02-01 DIAGNOSIS — G4710 Hypersomnia, unspecified: Secondary | ICD-10-CM | POA: Diagnosis not present

## 2022-02-01 DIAGNOSIS — G4733 Obstructive sleep apnea (adult) (pediatric): Secondary | ICD-10-CM | POA: Diagnosis not present

## 2022-02-03 DIAGNOSIS — R7989 Other specified abnormal findings of blood chemistry: Secondary | ICD-10-CM | POA: Diagnosis not present

## 2022-02-09 DIAGNOSIS — G4733 Obstructive sleep apnea (adult) (pediatric): Secondary | ICD-10-CM | POA: Diagnosis not present

## 2022-02-13 ENCOUNTER — Other Ambulatory Visit: Payer: Self-pay | Admitting: Family Medicine

## 2022-02-17 DIAGNOSIS — E291 Testicular hypofunction: Secondary | ICD-10-CM | POA: Diagnosis not present

## 2022-02-24 DIAGNOSIS — E119 Type 2 diabetes mellitus without complications: Secondary | ICD-10-CM | POA: Diagnosis not present

## 2022-02-24 DIAGNOSIS — E291 Testicular hypofunction: Secondary | ICD-10-CM | POA: Diagnosis not present

## 2022-03-03 DIAGNOSIS — E291 Testicular hypofunction: Secondary | ICD-10-CM | POA: Diagnosis not present

## 2022-03-10 ENCOUNTER — Other Ambulatory Visit: Payer: Self-pay | Admitting: Family Medicine

## 2022-03-10 DIAGNOSIS — E291 Testicular hypofunction: Secondary | ICD-10-CM | POA: Diagnosis not present

## 2022-03-14 ENCOUNTER — Other Ambulatory Visit: Payer: Self-pay | Admitting: Family Medicine

## 2022-09-27 ENCOUNTER — Telehealth: Payer: Self-pay | Admitting: General Practice

## 2022-09-27 NOTE — Telephone Encounter (Signed)
Contacted [facility] Fil[facility] Fillinger to schedule their annual wellness visit.   NO PCP LISTED.  [place] [facility] [phone]

## 2023-01-14 ENCOUNTER — Other Ambulatory Visit (HOSPITAL_BASED_OUTPATIENT_CLINIC_OR_DEPARTMENT_OTHER): Payer: Self-pay

## 2023-01-14 MED ORDER — TRULICITY 1.5 MG/0.5ML ~~LOC~~ SOAJ
1.5000 mg | SUBCUTANEOUS | 1 refills | Status: AC
  Filled 2023-01-14: qty 2, 28d supply, fill #0

## 2023-02-07 ENCOUNTER — Other Ambulatory Visit (HOSPITAL_BASED_OUTPATIENT_CLINIC_OR_DEPARTMENT_OTHER): Payer: Self-pay

## 2023-02-07 MED ORDER — DULAGLUTIDE 3 MG/0.5ML ~~LOC~~ SOAJ
3.0000 mg | SUBCUTANEOUS | 1 refills | Status: AC
  Filled 2023-02-07: qty 6, 84d supply, fill #0

## 2023-02-15 ENCOUNTER — Other Ambulatory Visit (HOSPITAL_BASED_OUTPATIENT_CLINIC_OR_DEPARTMENT_OTHER): Payer: Self-pay

## 2023-03-18 ENCOUNTER — Other Ambulatory Visit (HOSPITAL_BASED_OUTPATIENT_CLINIC_OR_DEPARTMENT_OTHER): Payer: Self-pay

## 2023-07-22 DIAGNOSIS — E230 Hypopituitarism: Secondary | ICD-10-CM | POA: Diagnosis not present

## 2023-09-15 DIAGNOSIS — R61 Generalized hyperhidrosis: Secondary | ICD-10-CM | POA: Diagnosis not present

## 2023-09-15 DIAGNOSIS — Z8679 Personal history of other diseases of the circulatory system: Secondary | ICD-10-CM | POA: Diagnosis not present

## 2023-09-15 DIAGNOSIS — Z7985 Long-term (current) use of injectable non-insulin antidiabetic drugs: Secondary | ICD-10-CM | POA: Diagnosis not present

## 2023-09-15 DIAGNOSIS — I444 Left anterior fascicular block: Secondary | ICD-10-CM | POA: Diagnosis not present

## 2023-09-15 DIAGNOSIS — I452 Bifascicular block: Secondary | ICD-10-CM | POA: Diagnosis not present

## 2023-09-15 DIAGNOSIS — Z87891 Personal history of nicotine dependence: Secondary | ICD-10-CM | POA: Diagnosis not present

## 2023-09-15 DIAGNOSIS — Z79899 Other long term (current) drug therapy: Secondary | ICD-10-CM | POA: Diagnosis not present

## 2023-09-15 DIAGNOSIS — E119 Type 2 diabetes mellitus without complications: Secondary | ICD-10-CM | POA: Diagnosis not present

## 2023-09-15 DIAGNOSIS — E785 Hyperlipidemia, unspecified: Secondary | ICD-10-CM | POA: Diagnosis not present

## 2023-09-15 DIAGNOSIS — I4510 Unspecified right bundle-branch block: Secondary | ICD-10-CM | POA: Diagnosis not present

## 2023-09-15 DIAGNOSIS — E7849 Other hyperlipidemia: Secondary | ICD-10-CM | POA: Diagnosis not present

## 2023-09-15 DIAGNOSIS — Z7984 Long term (current) use of oral hypoglycemic drugs: Secondary | ICD-10-CM | POA: Diagnosis not present

## 2023-09-23 DIAGNOSIS — E230 Hypopituitarism: Secondary | ICD-10-CM | POA: Diagnosis not present

## 2023-10-06 DIAGNOSIS — I420 Dilated cardiomyopathy: Secondary | ICD-10-CM | POA: Diagnosis not present

## 2023-10-06 DIAGNOSIS — R000 Tachycardia, unspecified: Secondary | ICD-10-CM | POA: Diagnosis not present

## 2023-10-06 DIAGNOSIS — I452 Bifascicular block: Secondary | ICD-10-CM | POA: Diagnosis not present

## 2023-10-06 DIAGNOSIS — R9431 Abnormal electrocardiogram [ECG] [EKG]: Secondary | ICD-10-CM | POA: Diagnosis not present

## 2023-10-06 DIAGNOSIS — R0609 Other forms of dyspnea: Secondary | ICD-10-CM | POA: Diagnosis not present

## 2023-10-06 DIAGNOSIS — I471 Supraventricular tachycardia, unspecified: Secondary | ICD-10-CM | POA: Diagnosis not present

## 2023-10-06 DIAGNOSIS — R5383 Other fatigue: Secondary | ICD-10-CM | POA: Diagnosis not present

## 2023-10-06 DIAGNOSIS — E7849 Other hyperlipidemia: Secondary | ICD-10-CM | POA: Diagnosis not present

## 2023-10-07 DIAGNOSIS — E230 Hypopituitarism: Secondary | ICD-10-CM | POA: Diagnosis not present

## 2023-10-07 DIAGNOSIS — E782 Mixed hyperlipidemia: Secondary | ICD-10-CM | POA: Diagnosis not present

## 2023-10-13 DIAGNOSIS — E230 Hypopituitarism: Secondary | ICD-10-CM | POA: Diagnosis not present

## 2023-10-14 DIAGNOSIS — R0609 Other forms of dyspnea: Secondary | ICD-10-CM | POA: Diagnosis not present

## 2023-10-14 DIAGNOSIS — R000 Tachycardia, unspecified: Secondary | ICD-10-CM | POA: Diagnosis not present

## 2023-10-14 DIAGNOSIS — I471 Supraventricular tachycardia, unspecified: Secondary | ICD-10-CM | POA: Diagnosis not present

## 2023-10-14 DIAGNOSIS — R5383 Other fatigue: Secondary | ICD-10-CM | POA: Diagnosis not present

## 2023-10-14 DIAGNOSIS — I452 Bifascicular block: Secondary | ICD-10-CM | POA: Diagnosis not present

## 2023-10-14 DIAGNOSIS — R9431 Abnormal electrocardiogram [ECG] [EKG]: Secondary | ICD-10-CM | POA: Diagnosis not present

## 2023-10-14 DIAGNOSIS — E7849 Other hyperlipidemia: Secondary | ICD-10-CM | POA: Diagnosis not present

## 2023-10-14 DIAGNOSIS — I420 Dilated cardiomyopathy: Secondary | ICD-10-CM | POA: Diagnosis not present

## 2023-10-20 DIAGNOSIS — I420 Dilated cardiomyopathy: Secondary | ICD-10-CM | POA: Diagnosis not present

## 2023-10-20 DIAGNOSIS — I471 Supraventricular tachycardia, unspecified: Secondary | ICD-10-CM | POA: Diagnosis not present

## 2023-10-20 DIAGNOSIS — R5383 Other fatigue: Secondary | ICD-10-CM | POA: Diagnosis not present

## 2023-10-20 DIAGNOSIS — E7849 Other hyperlipidemia: Secondary | ICD-10-CM | POA: Diagnosis not present

## 2023-10-20 DIAGNOSIS — R000 Tachycardia, unspecified: Secondary | ICD-10-CM | POA: Diagnosis not present

## 2023-10-20 DIAGNOSIS — I452 Bifascicular block: Secondary | ICD-10-CM | POA: Diagnosis not present

## 2023-10-20 DIAGNOSIS — R0609 Other forms of dyspnea: Secondary | ICD-10-CM | POA: Diagnosis not present

## 2023-10-20 DIAGNOSIS — R9431 Abnormal electrocardiogram [ECG] [EKG]: Secondary | ICD-10-CM | POA: Diagnosis not present

## 2023-10-21 DIAGNOSIS — E230 Hypopituitarism: Secondary | ICD-10-CM | POA: Diagnosis not present

## 2023-11-04 DIAGNOSIS — E230 Hypopituitarism: Secondary | ICD-10-CM | POA: Diagnosis not present

## 2023-11-07 DIAGNOSIS — E782 Mixed hyperlipidemia: Secondary | ICD-10-CM | POA: Diagnosis not present

## 2023-11-07 DIAGNOSIS — E230 Hypopituitarism: Secondary | ICD-10-CM | POA: Diagnosis not present

## 2023-11-07 DIAGNOSIS — I452 Bifascicular block: Secondary | ICD-10-CM | POA: Diagnosis not present

## 2023-11-07 DIAGNOSIS — E1165 Type 2 diabetes mellitus with hyperglycemia: Secondary | ICD-10-CM | POA: Diagnosis not present

## 2023-11-21 DIAGNOSIS — E230 Hypopituitarism: Secondary | ICD-10-CM | POA: Diagnosis not present

## 2023-11-24 DIAGNOSIS — N529 Male erectile dysfunction, unspecified: Secondary | ICD-10-CM | POA: Diagnosis not present

## 2023-11-25 DIAGNOSIS — E230 Hypopituitarism: Secondary | ICD-10-CM | POA: Diagnosis not present

## 2023-12-02 DIAGNOSIS — E230 Hypopituitarism: Secondary | ICD-10-CM | POA: Diagnosis not present

## 2023-12-23 DIAGNOSIS — E230 Hypopituitarism: Secondary | ICD-10-CM | POA: Diagnosis not present

## 2023-12-30 DIAGNOSIS — E230 Hypopituitarism: Secondary | ICD-10-CM | POA: Diagnosis not present

## 2024-01-06 DIAGNOSIS — E230 Hypopituitarism: Secondary | ICD-10-CM | POA: Diagnosis not present

## 2024-01-12 DIAGNOSIS — E230 Hypopituitarism: Secondary | ICD-10-CM | POA: Diagnosis not present

## 2024-01-19 DIAGNOSIS — E291 Testicular hypofunction: Secondary | ICD-10-CM | POA: Diagnosis not present

## 2024-01-30 DIAGNOSIS — E230 Hypopituitarism: Secondary | ICD-10-CM | POA: Diagnosis not present

## 2024-01-30 DIAGNOSIS — E782 Mixed hyperlipidemia: Secondary | ICD-10-CM | POA: Diagnosis not present

## 2024-01-30 DIAGNOSIS — I452 Bifascicular block: Secondary | ICD-10-CM | POA: Diagnosis not present

## 2024-01-30 DIAGNOSIS — E1165 Type 2 diabetes mellitus with hyperglycemia: Secondary | ICD-10-CM | POA: Diagnosis not present

## 2024-02-02 DIAGNOSIS — E230 Hypopituitarism: Secondary | ICD-10-CM | POA: Diagnosis not present

## 2024-02-10 DIAGNOSIS — E230 Hypopituitarism: Secondary | ICD-10-CM | POA: Diagnosis not present

## 2024-02-17 DIAGNOSIS — E230 Hypopituitarism: Secondary | ICD-10-CM | POA: Diagnosis not present

## 2024-02-22 DIAGNOSIS — N529 Male erectile dysfunction, unspecified: Secondary | ICD-10-CM | POA: Diagnosis not present

## 2024-02-24 DIAGNOSIS — E230 Hypopituitarism: Secondary | ICD-10-CM | POA: Diagnosis not present

## 2024-03-02 DIAGNOSIS — E230 Hypopituitarism: Secondary | ICD-10-CM | POA: Diagnosis not present

## 2024-03-07 DIAGNOSIS — E230 Hypopituitarism: Secondary | ICD-10-CM | POA: Diagnosis not present

## 2024-03-16 DIAGNOSIS — E230 Hypopituitarism: Secondary | ICD-10-CM | POA: Diagnosis not present

## 2024-03-23 DIAGNOSIS — E230 Hypopituitarism: Secondary | ICD-10-CM | POA: Diagnosis not present

## 2024-03-30 DIAGNOSIS — E230 Hypopituitarism: Secondary | ICD-10-CM | POA: Diagnosis not present

## 2024-04-06 DIAGNOSIS — E230 Hypopituitarism: Secondary | ICD-10-CM | POA: Diagnosis not present

## 2024-04-13 DIAGNOSIS — E230 Hypopituitarism: Secondary | ICD-10-CM | POA: Diagnosis not present

## 2024-04-19 DIAGNOSIS — H524 Presbyopia: Secondary | ICD-10-CM | POA: Diagnosis not present

## 2024-04-19 DIAGNOSIS — E119 Type 2 diabetes mellitus without complications: Secondary | ICD-10-CM | POA: Diagnosis not present

## 2024-04-19 DIAGNOSIS — H4322 Crystalline deposits in vitreous body, left eye: Secondary | ICD-10-CM | POA: Diagnosis not present

## 2024-04-19 DIAGNOSIS — H52223 Regular astigmatism, bilateral: Secondary | ICD-10-CM | POA: Diagnosis not present

## 2024-04-19 DIAGNOSIS — H2512 Age-related nuclear cataract, left eye: Secondary | ICD-10-CM | POA: Diagnosis not present

## 2024-04-19 DIAGNOSIS — Z961 Presence of intraocular lens: Secondary | ICD-10-CM | POA: Diagnosis not present

## 2024-04-19 DIAGNOSIS — H5203 Hypermetropia, bilateral: Secondary | ICD-10-CM | POA: Diagnosis not present

## 2024-04-19 DIAGNOSIS — Z7984 Long term (current) use of oral hypoglycemic drugs: Secondary | ICD-10-CM | POA: Diagnosis not present

## 2024-04-20 DIAGNOSIS — E230 Hypopituitarism: Secondary | ICD-10-CM | POA: Diagnosis not present

## 2024-04-24 DIAGNOSIS — Z13220 Encounter for screening for lipoid disorders: Secondary | ICD-10-CM | POA: Diagnosis not present

## 2024-04-24 DIAGNOSIS — E1165 Type 2 diabetes mellitus with hyperglycemia: Secondary | ICD-10-CM | POA: Diagnosis not present

## 2024-04-24 DIAGNOSIS — Z125 Encounter for screening for malignant neoplasm of prostate: Secondary | ICD-10-CM | POA: Diagnosis not present

## 2024-04-24 DIAGNOSIS — E782 Mixed hyperlipidemia: Secondary | ICD-10-CM | POA: Diagnosis not present

## 2024-04-24 DIAGNOSIS — E230 Hypopituitarism: Secondary | ICD-10-CM | POA: Diagnosis not present

## 2024-04-24 DIAGNOSIS — Z0000 Encounter for general adult medical examination without abnormal findings: Secondary | ICD-10-CM | POA: Diagnosis not present

## 2024-04-24 DIAGNOSIS — I452 Bifascicular block: Secondary | ICD-10-CM | POA: Diagnosis not present

## 2024-04-24 DIAGNOSIS — R42 Dizziness and giddiness: Secondary | ICD-10-CM | POA: Diagnosis not present

## 2024-04-24 DIAGNOSIS — Z1329 Encounter for screening for other suspected endocrine disorder: Secondary | ICD-10-CM | POA: Diagnosis not present

## 2024-04-27 DIAGNOSIS — E230 Hypopituitarism: Secondary | ICD-10-CM | POA: Diagnosis not present

## 2024-05-04 DIAGNOSIS — E230 Hypopituitarism: Secondary | ICD-10-CM | POA: Diagnosis not present

## 2024-05-11 DIAGNOSIS — E291 Testicular hypofunction: Secondary | ICD-10-CM | POA: Diagnosis not present

## 2024-05-18 DIAGNOSIS — E230 Hypopituitarism: Secondary | ICD-10-CM | POA: Diagnosis not present

## 2024-05-22 DIAGNOSIS — R0683 Snoring: Secondary | ICD-10-CM | POA: Diagnosis not present

## 2024-05-22 DIAGNOSIS — G4733 Obstructive sleep apnea (adult) (pediatric): Secondary | ICD-10-CM | POA: Diagnosis not present

## 2024-05-22 DIAGNOSIS — R9431 Abnormal electrocardiogram [ECG] [EKG]: Secondary | ICD-10-CM | POA: Diagnosis not present

## 2024-05-22 DIAGNOSIS — R000 Tachycardia, unspecified: Secondary | ICD-10-CM | POA: Diagnosis not present

## 2024-05-22 DIAGNOSIS — I471 Supraventricular tachycardia, unspecified: Secondary | ICD-10-CM | POA: Diagnosis not present

## 2024-05-22 DIAGNOSIS — R0609 Other forms of dyspnea: Secondary | ICD-10-CM | POA: Diagnosis not present

## 2024-05-22 DIAGNOSIS — I420 Dilated cardiomyopathy: Secondary | ICD-10-CM | POA: Diagnosis not present

## 2024-05-22 DIAGNOSIS — I452 Bifascicular block: Secondary | ICD-10-CM | POA: Diagnosis not present

## 2024-05-22 DIAGNOSIS — E7849 Other hyperlipidemia: Secondary | ICD-10-CM | POA: Diagnosis not present

## 2024-05-25 DIAGNOSIS — E230 Hypopituitarism: Secondary | ICD-10-CM | POA: Diagnosis not present

## 2024-05-29 DIAGNOSIS — R9431 Abnormal electrocardiogram [ECG] [EKG]: Secondary | ICD-10-CM | POA: Diagnosis not present

## 2024-05-29 DIAGNOSIS — E7849 Other hyperlipidemia: Secondary | ICD-10-CM | POA: Diagnosis not present

## 2024-05-29 DIAGNOSIS — J984 Other disorders of lung: Secondary | ICD-10-CM | POA: Diagnosis not present

## 2024-05-29 DIAGNOSIS — G4733 Obstructive sleep apnea (adult) (pediatric): Secondary | ICD-10-CM | POA: Diagnosis not present

## 2024-05-29 DIAGNOSIS — R0609 Other forms of dyspnea: Secondary | ICD-10-CM | POA: Diagnosis not present

## 2024-05-29 DIAGNOSIS — R000 Tachycardia, unspecified: Secondary | ICD-10-CM | POA: Diagnosis not present

## 2024-05-29 DIAGNOSIS — I452 Bifascicular block: Secondary | ICD-10-CM | POA: Diagnosis not present

## 2024-05-29 DIAGNOSIS — I420 Dilated cardiomyopathy: Secondary | ICD-10-CM | POA: Diagnosis not present

## 2024-05-29 DIAGNOSIS — R0683 Snoring: Secondary | ICD-10-CM | POA: Diagnosis not present

## 2024-05-29 DIAGNOSIS — I471 Supraventricular tachycardia, unspecified: Secondary | ICD-10-CM | POA: Diagnosis not present

## 2024-05-30 DIAGNOSIS — R0683 Snoring: Secondary | ICD-10-CM | POA: Diagnosis not present

## 2024-05-30 DIAGNOSIS — G4733 Obstructive sleep apnea (adult) (pediatric): Secondary | ICD-10-CM | POA: Diagnosis not present

## 2024-05-30 DIAGNOSIS — G4710 Hypersomnia, unspecified: Secondary | ICD-10-CM | POA: Diagnosis not present

## 2024-05-30 DIAGNOSIS — R0609 Other forms of dyspnea: Secondary | ICD-10-CM | POA: Diagnosis not present

## 2024-05-30 DIAGNOSIS — Z789 Other specified health status: Secondary | ICD-10-CM | POA: Diagnosis not present

## 2024-06-01 DIAGNOSIS — E291 Testicular hypofunction: Secondary | ICD-10-CM | POA: Diagnosis not present

## 2024-06-04 ENCOUNTER — Inpatient Hospital Stay

## 2024-06-04 ENCOUNTER — Inpatient Hospital Stay: Attending: Hematology | Admitting: Hematology

## 2024-06-04 VITALS — BP 116/81 | HR 100 | Temp 98.6°F | Resp 17 | Ht 70.0 in | Wt 183.2 lb

## 2024-06-04 DIAGNOSIS — Z7985 Long-term (current) use of injectable non-insulin antidiabetic drugs: Secondary | ICD-10-CM | POA: Diagnosis not present

## 2024-06-04 DIAGNOSIS — G4730 Sleep apnea, unspecified: Secondary | ICD-10-CM | POA: Diagnosis not present

## 2024-06-04 DIAGNOSIS — E785 Hyperlipidemia, unspecified: Secondary | ICD-10-CM | POA: Diagnosis not present

## 2024-06-04 DIAGNOSIS — D751 Secondary polycythemia: Secondary | ICD-10-CM | POA: Insufficient documentation

## 2024-06-04 DIAGNOSIS — Z79899 Other long term (current) drug therapy: Secondary | ICD-10-CM | POA: Insufficient documentation

## 2024-06-04 DIAGNOSIS — Z806 Family history of leukemia: Secondary | ICD-10-CM | POA: Insufficient documentation

## 2024-06-04 DIAGNOSIS — I10 Essential (primary) hypertension: Secondary | ICD-10-CM | POA: Insufficient documentation

## 2024-06-04 DIAGNOSIS — E611 Iron deficiency: Secondary | ICD-10-CM

## 2024-06-04 DIAGNOSIS — E119 Type 2 diabetes mellitus without complications: Secondary | ICD-10-CM | POA: Diagnosis not present

## 2024-06-04 DIAGNOSIS — Z8041 Family history of malignant neoplasm of ovary: Secondary | ICD-10-CM | POA: Insufficient documentation

## 2024-06-04 DIAGNOSIS — D509 Iron deficiency anemia, unspecified: Secondary | ICD-10-CM | POA: Insufficient documentation

## 2024-06-04 DIAGNOSIS — R0989 Other specified symptoms and signs involving the circulatory and respiratory systems: Secondary | ICD-10-CM | POA: Insufficient documentation

## 2024-06-04 DIAGNOSIS — Z87891 Personal history of nicotine dependence: Secondary | ICD-10-CM | POA: Diagnosis not present

## 2024-06-04 LAB — CBC WITH DIFFERENTIAL/PLATELET
Abs Immature Granulocytes: 0.04 K/uL (ref 0.00–0.07)
Basophils Absolute: 0.1 K/uL (ref 0.0–0.1)
Basophils Relative: 1 %
Eosinophils Absolute: 0.2 K/uL (ref 0.0–0.5)
Eosinophils Relative: 2 %
HCT: 45.3 % (ref 39.0–52.0)
Hemoglobin: 14 g/dL (ref 13.0–17.0)
Immature Granulocytes: 0 %
Lymphocytes Relative: 17 %
Lymphs Abs: 1.8 K/uL (ref 0.7–4.0)
MCH: 22.9 pg — ABNORMAL LOW (ref 26.0–34.0)
MCHC: 30.9 g/dL (ref 30.0–36.0)
MCV: 74.1 fL — ABNORMAL LOW (ref 80.0–100.0)
Monocytes Absolute: 1 K/uL (ref 0.1–1.0)
Monocytes Relative: 9 %
Neutro Abs: 7.9 K/uL — ABNORMAL HIGH (ref 1.7–7.7)
Neutrophils Relative %: 71 %
Platelets: 238 K/uL (ref 150–400)
RBC: 6.11 MIL/uL — ABNORMAL HIGH (ref 4.22–5.81)
RDW: 19.8 % — ABNORMAL HIGH (ref 11.5–15.5)
WBC: 10.9 K/uL — ABNORMAL HIGH (ref 4.0–10.5)
nRBC: 0 % (ref 0.0–0.2)

## 2024-06-04 LAB — IRON AND IRON BINDING CAPACITY (CC-WL,HP ONLY)
Iron: 21 ug/dL — ABNORMAL LOW (ref 45–182)
Saturation Ratios: 5 % — ABNORMAL LOW (ref 17.9–39.5)
TIBC: 396 ug/dL (ref 250–450)
UIBC: 375 ug/dL

## 2024-06-04 LAB — FERRITIN: Ferritin: 27 ng/mL (ref 24–336)

## 2024-06-04 NOTE — Progress Notes (Signed)
 [facility]   [phone] [phone]   Clinic New Consult Note   Patient Care Team: Roz Anes, MD as Consulting Physician (Ophthalmology) Kassie Mallick, MD (Inactive) as Consulting Physician (Endocrinology) Jordan, Peter M, MD as Consulting Physician (Cardiology) Avram Lupita BRAVO, MD as Consulting Physician (Gastroenterology) Chick Venetia BRAVO, MD as Consulting Physician (Sports Medicine) 06/04/2024  CHIEF COMPLAINTS/PURPOSE OF CONSULTATION:  Iron deficient anemia  REFERRING PHYSICIAN: Nena Rosina CROME, PA-C   Discussed the use of AI scribe software for clinical note transcription with the patient, who gave verbal consent to proceed.  History of Present Illness Matthew Ramirez is an 83 year old male who presents with anemia. He was referred by his family doctor for evaluation of anemia.  He experiences fatigue and low energy for the past one to two years, requiring frequent rest during activities like mowing and raking. He remains active but was unaware of his anemia until recently. He has a family history of leukemia in his mother and ovarian cancer in his sister.  He has routine lab work with his PCP on April 24, 2024 showed low ferritin 21, low serum iron and increased TIBC, consistent with iron deficiency.  CBC was not done on that visit, and CMP was unremarkable.  He manages diabetes with Jardiance, Trulicity , and metformin , maintaining blood sugar levels around 101 mg/dL. He also takes medication for hypertension and hyperlipidemia. He quit smoking in 1970 and consumes alcohol occasionally.  He has been on a heart monitor and uses an inhaler for recent breathing difficulties. He has sleep apnea and has tried CPAP without success. He donated blood three times earlier this year due to elevated hemoglobin levels, with a hemoglobin of 16.9 in March. He denies any blood in stool or gastrointestinal issues.     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  Stable cysts 03/31/17 MRI.   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  Has hearing aids AU.  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   Plantar fasciitis    deep tissue laser tx at Dr. Nydia (chiro) helped A LOT!   Polycystic kidney disease    Normal renal function as of 11/2015.  Stable on imaging 09/2016--recommended repeat in 6 mo.  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   Tachycardia 2017   Echo and event monitor negative.  No recurrence as of cardiology f/u 02/2016.     SURGICAL HISTORY: Past Surgical History:  Procedure Laterality Date   CIRCUMCISION  age 84   COLONOSCOPY W/ POLYPECTOMY  2009; 2014   09/2012: tubular adenoma--no high grade dysplasia.  Repeat 09/2017 approx.  Diverticulosis and small int hem   DENTAL SURGERY     10+  root canals, several bridges   EYE SURGERY     Right (s/p traumatic injury to eye)   TRANSTHORACIC ECHOCARDIOGRAM  07/16/08; 11/2015   EF 55-60%, mild LVH and RVH, impaired relaxation.  2017 moderate LVH, EF 65-70%, grade I DD.   US  AORTA: SCREENING  11/2015   No aneurism but ectatic portion of aorta was noted; repeat u/s in 5 yrs recommended    SOCIAL HISTORY: Social History    Socioeconomic History   Marital status: Married    Spouse name: Not on file   Number of children: 4   Years of education: Not on file   Highest education level: Not on file  Occupational History   Occupation: radio broadcast assistant  Tobacco Use   Smoking status: Former    Current packs/day: 0.00    Average packs/day: 1 pack/day for 20.0 years (20.0 ttl pk-yrs)    Types: Cigarettes    Start date: 07/12/1948    Quit date: 07/12/1968    Years since quitting: 55.9   Smokeless tobacco: Never  Substance and Sexual Activity   Alcohol use: Yes    Alcohol/week: 0.0 standard drinks of alcohol    Comment: a couple of drinks a week   Drug use: No   Sexual activity: Not on file  Other Topics Concern   Not on file  Social History Narrative   Married, has 4 grown daughters, 15 grandchildren, and 1 great grandchild.   Occupation: radio broadcast assistant.  Originally from [place] [facility], has lived in [place] since age 72 yrs.   Distant tobacco abuse: quit 1970.  Rare ETOH intake, no hx of ETOH abuse.  No drug use.   Walks about 4 miles per day.   Social Drivers of Corporate Investment Banker Strain: Low Risk  (02/22/2023)   Received [facility]   Overall Financial Resource Strain (CARDIA)    Difficulty of Paying Living Expenses: Not very hard  Food Insecurity: No Food Insecurity (06/04/2024)   Hunger Vital Sign    Worried About Running Out of Food in the Last Year: Never true    Ran Out of Food in the Last Year: Never true  Transportation Needs: No Transportation Needs (06/04/2024)   PRAPARE - Administrator, Civil Service (Medical): No    Lack of Transportation (Non-Medical): No  Physical Activity: Unknown (02/22/2023)   Received from [facility]   Exercise Vital Sign    On average, how many days per week do you engage in moderate to strenuous exercise (like a brisk walk)?: 4 days    Minutes of Exercise per Session: Not on file  Stress: No Stress Concern  Present (02/22/2023)   Received from [facility] - Occupational Stress Questionnaire    Feeling of Stress : Not at all  Social Connections: Socially Integrated (02/22/2023)   Received from [facility]   Social Network    How would you rate your social network (family, work, friends)?: Good participation with social networks  Intimate Partner Violence: Not At Risk (06/04/2024)   Humiliation, Afraid, Rape, and Kick questionnaire    Fear of Current or Ex-Partner: No    Emotionally Abused: No    Physically Abused: No    Sexually Abused: No    FAMILY HISTORY: Family History  Problem Relation Age of Onset   Cancer Mother        leukemia   Diabetes Mother    Cancer Sister  ovarian   Emphysema Father        smoker   Diabetes Sister    Kidney disease Brother    Heart disease Brother    Colon cancer Neg Hx    Rectal cancer Neg Hx    Stomach cancer Neg Hx    Pancreatic cancer Neg Hx    Prostate cancer Neg Hx    Esophageal cancer Neg Hx    Liver cancer Neg Hx    Liver disease Neg Hx     ALLERGIES:  has no known allergies.  MEDICATIONS:  Current Outpatient Medications  Medication Sig Dispense Refill   albuterol (VENTOLIN HFA) 108 (90 Base) MCG/ACT inhaler Inhale into the lungs.     aspirin EC 81 MG tablet TAKE 1 TABLET BY MOUTH EVERY DAY; Duration: 90     Blood Glucose Monitoring Suppl (ONE TOUCH ULTRA SYSTEM KIT) W/DEVICE KIT 1 kit by Does not apply route once. 100 each 3   Cholecalciferol (VITAMIN D3 PO) Take 25 mcg by mouth. 2000 iu     Cyanocobalamin (B-12) 1000 MCG TABS Take 1,000 mcg by mouth daily.     Dulaglutide  (TRULICITY ) 1.5 MG/0.5ML SOPN Inject 1.5 mg into the skin once a week. 6 mL 1   Dulaglutide  3 MG/0.5ML SOPN Inject 3 mg into the skin once a week. 6 mL 1   ezetimibe (ZETIA) 10 MG tablet 1 tablet Oral Once a day; Duration: 90 days     glipiZIDE  (GLUCOTROL  XL) 10 MG 24 hr tablet TAKE 1 TABLET (10 MG TOTAL) BY  MOUTH DAILY WITH BREAKFAST. 30 tablet 0   glucose blood (ONETOUCH ULTRA) test strip USE AS DIRECTED TO TEST BLOOD GLUCE TWICE DAILY 100 strip 11   ibuprofen (ADVIL,MOTRIN) 200 MG tablet Take 400 mg by mouth every 3 (three) hours as needed.     lisinopril  (ZESTRIL ) 2.5 MG tablet 1 tablet Oral Once a day; Duration: 90 days     Magnesium 500 MG TABS Take 2 tablets by mouth daily.      metFORMIN  (GLUCOPHAGE ) 1000 MG tablet Take 1 tablet (1,000 mg total) by mouth 2 (two) times daily with a meal. 180 tablet 3   metoprolol succinate (TOPROL-XL) 25 MG 24 hr tablet TAKE 1 TABLET BY MOUTH TWICE DAILY; Duration: 90 days HOLD IF SYSTOLIC BLOOD PRESSURE IS LESS THAN 105 OR HR IS LESS THAN 60     nystatin -triamcinolone  ointment (MYCOLOG) Apply topically 2 (two) times daily. 30 g 2   Omega-3 Fatty Acids (FISH OIL) 1200 MG CAPS Take 1,200 mg by mouth 2 (two) times daily.     ONETOUCH DELICA LANCETS MISC Check glucose twice daily 60 each 5   pioglitazone  (ACTOS ) 45 MG tablet TAKE 1 TABLET BY MOUTH EVERY DAY 30 tablet 0   pravastatin (PRAVACHOL) 40 MG tablet 1 tablet Oral Once a day; Duration: 90 days     senna (SENOKOT) 8.6 MG TABS Take 2 tablets by mouth daily.       TURMERIC PO Take 1,000 mg by mouth daily.     Zinc 50 MG CAPS Take 50 mg by mouth daily.     No current facility-administered medications for this visit.    REVIEW OF SYSTEMS:   Constitutional: Denies fevers, chills or abnormal night sweats Eyes: Denies blurriness of vision, double vision or watery eyes Ears, nose, mouth, throat, and face: Denies mucositis or sore throat Respiratory: Denies cough, dyspnea or wheezes Cardiovascular: Denies palpitation, chest discomfort or lower extremity swelling Gastrointestinal:  Denies nausea,  heartburn or change in bowel habits Skin: Denies abnormal skin rashes Lymphatics: Denies new lymphadenopathy or easy bruising Neurological:Denies numbness, tingling or new weaknesses Behavioral/Psych: Mood is  stable, no new changes  All other systems were reviewed with the patient and are negative.  PHYSICAL EXAMINATION: ECOG PERFORMANCE STATUS: 1 - Symptomatic but completely ambulatory  Vitals:   06/04/24 1311  BP: 116/81  Pulse: 100  Resp: 17  Temp: 98.6 F (37 C)  SpO2: 95%   Filed Weights   06/04/24 1311  Weight: 183 lb 3.2 oz (83.1 kg)    GENERAL:alert, no distress and comfortable SKIN: skin color, texture, turgor are normal, no rashes or significant lesions EYES: normal, conjunctiva are pink and non-injected, sclera clear OROPHARYNX:no exudate, no erythema and lips, buccal mucosa, and tongue normal  NECK: supple, thyroid normal size, non-tender, without nodularity LYMPH:  no palpable lymphadenopathy in the cervical, axillary or inguinal LUNGS: clear to auscultation and percussion with normal breathing effort HEART: regular rate & rhythm and no murmurs and no lower extremity edema ABDOMEN:abdomen soft, non-tender and normal bowel sounds Musculoskeletal:no cyanosis of digits and no clubbing  PSYCH: alert & oriented x 3 with fluent speech NEURO: no focal motor/sensory deficits  Physical Exam CHEST: Crackles at lung bases.  LABORATORY DATA:  I have reviewed the data as listed    Latest Ref Rng & Units 06/04/2024    1:42 PM 08/01/2020   11:11 AM 02/16/2019    8:26 AM  CBC  WBC 4.0 - 10.5 K/uL 10.9  8.2  8.0   Hemoglobin 13.0 - 17.0 g/dL 85.9  85.6  85.6   Hematocrit 39.0 - 52.0 % 45.3  41.4  41.8   Platelets 150 - 400 K/uL 238  146  205       Latest Ref Rng & Units 02/20/2021    2:31 PM 08/01/2020   11:11 AM 03/05/2020    9:14 AM  CMP  Glucose 65 - 99 mg/dL 796  657  876   BUN 7 - 25 mg/dL 20  10  16    Creatinine 0.70 - 1.28 mg/dL 9.19  9.27  9.23   Sodium 135 - 146 mmol/L 139  137  138   Potassium 3.5 - 5.3 mmol/L 4.1  4.2  4.5   Chloride 98 - 110 mmol/L 102  98  103   CO2 20 - 32 mmol/L 31  28  25    Calcium 8.6 - 10.3 mg/dL 9.4  9.2  9.0      RADIOGRAPHIC  STUDIES: I have personally reviewed the radiological images as listed and agreed with the findings in the report. No results found.  Assessment & Plan Iron deficiency  Likely secondary to recent blood donations (he had three time in past 6-8 months). Hemoglobin was elevated in March, leading to blood donations. Current symptoms include fatigue and decreased exercise tolerance. No gastrointestinal bleeding reported.  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 - Ordered genetic testing to rule out polycythemia vera - Hold oral iron supplementation until further results are available - Scheduled follow-up phone call in two weeks to discuss results  Type 2 diabetes mellitus Recent A1c of 8.2. Currently managed with Trulicity , Jardiance, and metformin . Blood glucose levels are generally well-controlled, with occasional elevations.  Dyspnea Recent onset of dyspnea, particularly when sitting and watching TV. Recent inhaler  use has been beneficial. Oxygen  saturation has improved to the 90s. Crackles noted at the lung bases, warranting further evaluation for heart failure. - Follow up with cardiologist for evaluation of heart failure - Continue inhaler use as needed  Plan - Labs today including CBC, iron study, and JAK2 mutation - Will follow-up with a phone call in 2 weeks to review his above test result.    Orders Placed This Encounter  Procedures   CBC with Differential/Platelet    Standing Status:   Future    Number of Occurrences:   1    Expected Date:   06/04/2024    Expiration Date:   06/04/2025   Ferritin    Standing Status:   Future    Number of Occurrences:   1    Expected Date:   06/04/2024    Expiration Date:   06/04/2025   JAK2 V617 reflex CALR/MPL/E12-15    Standing Status:   Future    Number of  Occurrences:   1    Expected Date:   06/04/2024    Expiration Date:   06/04/2025   Iron and Iron Binding Capacity ([facility] only)    Standing Status:   Future    Number of Occurrences:   1    Expected Date:   06/04/2024    Expiration Date:   06/04/2025    All questions were answered. The patient knows to call the clinic with any problems, questions or concerns. I spent 25 minutes counseling the patient face to face. The total time spent in the appointment was 30 minutes including review of chart and various tests results, discussions about plan of care and coordination of care plan.     Onita Mattock, MD 06/04/2024 1:58 PM

## 2024-06-06 DIAGNOSIS — E230 Hypopituitarism: Secondary | ICD-10-CM | POA: Diagnosis not present

## 2024-06-12 DIAGNOSIS — E1165 Type 2 diabetes mellitus with hyperglycemia: Secondary | ICD-10-CM | POA: Diagnosis not present

## 2024-06-12 DIAGNOSIS — R0601 Orthopnea: Secondary | ICD-10-CM | POA: Diagnosis not present

## 2024-06-12 DIAGNOSIS — E230 Hypopituitarism: Secondary | ICD-10-CM | POA: Diagnosis not present

## 2024-06-14 DIAGNOSIS — G4710 Hypersomnia, unspecified: Secondary | ICD-10-CM | POA: Diagnosis not present

## 2024-06-14 DIAGNOSIS — Z789 Other specified health status: Secondary | ICD-10-CM | POA: Diagnosis not present

## 2024-06-14 DIAGNOSIS — R0609 Other forms of dyspnea: Secondary | ICD-10-CM | POA: Diagnosis not present

## 2024-06-14 DIAGNOSIS — E230 Hypopituitarism: Secondary | ICD-10-CM | POA: Diagnosis not present

## 2024-06-14 DIAGNOSIS — G4733 Obstructive sleep apnea (adult) (pediatric): Secondary | ICD-10-CM | POA: Diagnosis not present

## 2024-06-17 LAB — JAK2 V617F RFX CALR/MPL/E12-15

## 2024-06-17 LAB — CALR +MPL + E12-E15  (REFLEX)

## 2024-06-18 DIAGNOSIS — R0600 Dyspnea, unspecified: Secondary | ICD-10-CM | POA: Diagnosis not present

## 2024-06-18 NOTE — Progress Notes (Signed)
 Primary care provider: Rosina LITTIE Bullock, PA-C Referring provider:     Rosina LITTIE Bullock, PA-C  Assessment    1. Dyspnea, unspecified type   2. Obstructive sleep apnea syndrome, moderate   3. Wheezing     83 year old man with severe heart failure with EF 30% presents with ongoing weakness and shortness of breath and episodic awakenings. On exam he was wheezing on forceful expiration. His PFT shows restriction without obstruction.   The dyspnea could be multifactorial including heart failure or potentially active bronchitis or asthma. He had childhood asthma and is wheezing today.    Plan   Overnight pulse ox  CTPA chest  Referral for [facility] Continue inhalers , breztri , albuterol  CBC with diff, CMP, BNP Prednisone  taper Start doxycycline Follow up next week Follow glucose carefully and contact PCP if sugars are not controlled.  Annual influenza vaccination if no contraindications and pneumonia vaccination per current guidelines.    We discussed the diagnosis and treatment plan in detail and the patient expressed understanding.   Risks, benefits, and alternatives of the medications and treatment plan prescribed today were discussed, and patient expressed understanding.   Total time spent in this encounter on date of service was 55 minutes and does not include additional procedure time.  Including but not limited to activities such as reviewing patient records, obtaining or reviewing subjective medical history, obtaining or performing a history and physical examination, counseling and/or educating patient/family/caregiver, ordering prescription medication, tests, procedures, imaging, labs, referring to and communicating with other health care providers, documenting appropriate clinical information in the medical record electronic or other, interpreting results of prescribed tests/procedures, communicating those results to the patient/family/representative and coordinating patient  care.     Orders Placed This Encounter  Procedures  . CT Angio Chest Pulmonary  . BNP (B-Type Natruiretic Peptide)  . Comprehensive Metabolic Panel  . CBC And Differential  . Spirometry with Lung Volumes (PL), DLCO       Subjective   Chief Complaint  Patient presents with  . Shortness of Breath    PFT completed  . Sleep Apnea    HST Results    Patient ID:  Matthew Ramirez is a 83 y.o. male who presents today for follow up regarding heart failure and dyspnea. He reports coughing up a tablespoon a day. He does not have fevers or chills. No wheezing. The inhalers are helping. He had child hood asthma. The inhaler helps him cough more. On exam he was wheezing. He has a cough with clear sputum maybe a tablespoon a day.   Review of Systems  Constitutional:  Negative for fever.  Respiratory:  Positive for cough, sputum production and shortness of breath.   Cardiovascular:  Negative for chest pain.  All other systems reviewed and are negative.   History   Past Medical History[1] Past Surgical History[2] Social History[3] Family History[4]  Medication History      Medication Sig Dispense Refill  . albuterol sulfate HFA (VENTOLIN HFA) 108 (90 Base) MCG/ACT inhaler Inhale two puffs into the lungs every 6 (six) hours as needed for Wheezing. Inhale two puffs every four to six hours as needed. 18 g 2  . aspirin (ASPIRIN LOW DOSE) EC tablet Take one tablet (81 mg dose) by mouth daily. 90 tablet 0  . B Complex-C (B-COMPLEX WITH VITAMIN C) tablet Take one tablet by mouth daily.    . cholecalciferol (VITAMIN D3) 1000 units (25 mcg) tablet Take one tablet (1,000 Units  dose) by mouth daily.    SABRA doxycycline hyclate (VIBRA-TABS) 100 mg tablet Take one tablet (100 mg dose) by mouth 2 (two) times daily for 10 days. 20 tablet 0  . ezetimibe (ZETIA) 10 MG tablet Take one tablet (10 mg dose) by mouth daily. 30 tablet 0  . furosemide (LASIX) 20 mg tablet Take one tablet (20 mg dose) by  mouth daily. Please, hold if systolic blood pressure is less than 105. 30 tablet 5  . glipiZIDE  ER (GLUCOTROL  XL) 10 mg 24 hr tablet Take one tablet (10 mg dose) by mouth daily.    SABRA ketoconazole (NIZORAL) 2 % cream Apply topically daily. 60 g 1  . Lancets (ONETOUCH DELICA PLUS LANCET30G) MISC USE AS DIRECTED EVERY 4 HOURS AS NEEDED    . lisinopril  (PRINIVIL ,ZESTRIL ) 2.5 mg tablet TAKE ONE TABLET BY MOUTH DAILY. 90 tablet 1  . metformin  (GLUCOPHAGE ) 1000 MG tablet Take one tablet (1,000 mg dose) by mouth 2 (two) times daily.    . metformin  (GLUCOPHAGE ) 1000 MG tablet metformin  1,000 mg tablet  Take 1 tablet twice a day by oral route.    . metoprolol succinate (TOPROL-XL) 25 mg 24 hr tablet TAKE 1 TABLET 2 TIMES DAILY.PLEASE HOLD IF SYSTOLIC BP IS LESS THAN 105 OR HR IS LESS THAN 60 180 tablet 1  . Misc. Devices MISC Start CPAP at 14 cm. water pressure.  Prefer Resmed S11 CPAP machine with a mask and supplies for moderate OSA with AHI 18.  Mask of patient preference.  Send to a local DME. 1 each 0  . Misc. Devices MISC New mask fitting for OSA on CPAP. Having increased air leaks and will not trim beard. Send to current DME. 1 each 0  . Misc. Devices MISC Decrease pressure to 12 cm water pressure 1 each 0  . Misc. Devices MISC Referral to [facility] for oral appliance evaluation. Moderate OSA. AHI 18. 1 each 0  . Misc. Devices MISC Occlusal 30%, 30 mL's, apply twice a day to affected area 1 each 2  . Misc. Devices MISC Overnight pulse oxygen  1 each 0  . Misc. Devices MISC Referral for [facility] Dr. Carlie 1 each 0  . Comprehensive Outpatient Surge ULTRA test strip one strip (1 each dose) 2 (two) times daily.    . pioglitazone  (ACTOS ) 45 MG tablet Take one tablet (45 mg dose) by mouth daily.    . potassium chloride (KLOR-CON M10) 10 mEq crys ER tablet Take one tablet (10 mEq dose) by mouth daily. 30 tablet 5  . pravastatin sodium (PRAVACHOL) 40 mg tablet Take one tablet (40 mg dose) by mouth every evening. 90 tablet  0  . predniSONE  (DELTASONE ) 10 mg tablet Take 4 tablets for 3 days, Take 3 tablets for 3 days, Take 2 tablets for 3 days, Take 1 tablet for 3 days 30 tablet 0  . sildenafil citrate (REVATIO) 20 mg tablet     . sitaGLIPtin  (JANUVIA ) 100 mg tablet Januvia  100 mg tablet    . tadalafil (CIALIS) 20 MG tablet Take one tablet (20 mg dose) by mouth daily as needed.    . testosterone  (ANDROGEL  PUMP) 1.62% topical gel daily.    . testosterone  (ANDROGEL ,TESTIM ) 50 mg/5 g GEL gel     . testosterone  cypionate (DEPOTESTOTERONE CYPIONATE) 100 MG/ML injection SMARTSIG:2 Milliliter(s) IM Once a Week    . TRULICITY  3 MG/0.5ML SOPN SMARTSIG:3 Milligram(s) SUB-Q    . TURMERIC PO Take 1,000 mg by mouth daily.    SABRA  Zinc 50 MG CAPS Take one capsule (50 mg dose) by mouth daily.     No current facility-administered medications for this visit.   Allergies[5]     I reviewed the patient's medical,surgical,social and family history. The medications and allergies have been reviewed and updated.   Objective  BP 126/72 (BP Location: Left Upper Arm, Patient Position: Sitting)   Pulse 110   Resp 18   Ht 5' 10 (1.778 m)   Wt 180 lb (81.6 kg)   SpO2 92%   BMI 25.83 kg/m   General appearance:  alert, appears stated age, and cooperative Eyes:    pupils are equal, round and reactive Nose:     normal Mouth:    moist, no thrush Neck:    supple, no significant adenopathy, no JVD Chest:    Wheezing , chet tightnes Heart:    normal rate, regular rhythm, normal S1, S2, no murmurs, rubs, clicks or gallops Abdomen:   soft, nontender, nondistended Extremities:   peripheral pulses normal,no pitting edema, no clubbing   Pulmonary Function Testing / Spirometry  No results found. Radiology    Epworth Sleepiness Scale       * No data to display          CXR:  Results for orders placed during the hospital encounter of 05/29/24  XR Chest Pa And Lateral  Narrative EXAM: XR CHEST PA AND LATERAL  INDICATION:  Other hyperlipidemia  COMPARISON: October 20, 2021  FINDINGS: The cardiac silhouette, mediastinum, and pulmonary vasculature are within normal limits.  There is similar scarring in both mid lungs and at the right costophrenic angle. No pleural effusion or pneumothorax are identified.  The osseous structures and visualized upper abdomen are unremarkable.  Impression IMPRESSION: There is stable scarring within both mid lungs and at the right costophrenic angle.  Electronically Signed by: Vito Jama Crease, MD on 05/29/2024 11:35 AM    CT Scan: No results found for this or any previous visit.   No results found for this or any previous visit.   No results found for this or any previous visit.   No results found for this or any previous visit.   No results found for this or any previous visit.   No results found for this or any previous visit.   Results for orders placed during the hospital encounter of 07/12/20  CT Angio Pulmonary  Narrative COMPARISON: 07/12/2020.  INDICATION: PE suspected, low/intermediate prob, positive D-dimer  TECHNIQUE:  Impression CT ANGIO PULMONARY  CONTRAST: 90 mL  Isovue-370  3-D MIP Sagittal and Coronal reformats were generated and reviewed on an independent workstation and saved to PACS.  Dose reduction was utilized (automated exposure control, mA or kV adjustment based on patient size, or iterative image reconstruction).   FINDINGS:  CTA CHEST  Lower Neck: Unremarkable  Pulmonary Arteries: No pulmonary embolus identified.  Satisfactory opacification of pulmonary arteries.  Thoracic Aorta: Negative for dissection.  Negative for aneurysm.  Cardiovascular: Mild cardiomegaly  Lymph Nodes: No thoracic lymphadenopathy.  Pleura: No pleural effusions. Negative for pneumothorax.  Mediastinum: Unremarkable  Lungs: Bilateral multilobar groundglass infiltrates, consistent with viral pneumonia.  Chest Walls: Unremarkable  Bones: No  acute or destructive osseous process.  Multilevel degenerative changes.  Upper Abdomen: Multiple liver cystic densities, which demonstrates interval calcification.    IMPRESSION:  1.  No acute pulmonary embolus or thoracic aortic dissection identified. 2.  Bilateral multilobar groundglass infiltrates, consistent with viral pneumonia.   Electronically Signed by: Charlie Patch  No results found for this or any previous visit.   VQ Scan: No results found for this or any previous visit.   TTE:   Results for orders placed or performed in visit on 05/29/24  Echocardiogram Complete W Enhancing Agent  Result Value Ref Range   Patient Height 70    Patient Weight 2,960    Blood Pressure Monitoring 99/72    Calculated BMI 26.5    BSA 2.04 m2   LA M-L Dimension (A4C) 6.260 cm   LA S-I Dimension (A2C) 5.890 cm   LA Area Sys (A2C) 27.700 cm2   LA Area Sys (A4C) 23.400 cm2   LA ESV (A2C) 100.000 mL   LA ESV (A4C) 78.900 mL   LA A-P Dimension 4.200 cm   Left Atrium Dimension 2D 4.200 cm   LA ESV Index (A4C) 39.100 ml/m2   LA Volume Index (BP) 45.0 mL/m2   LA/Ao Ratio 0.977 no units   LV Length Dias (A4C) 10.600 cm   LV Area Dias (A4C) 68.000 cm2   LVEDV 361.000 ml   LVEDV 257.000 ml   LV Diastolic Volume (BP) 206.000 mL   LV Length Sys (A4C) 9.340 cm   LV Systolic Volume (BP) 248.000 mL   LVES V 175.000 ml   LV Systolic Volume (BP) 153.000 mL   IVSd 1.2 0.7 - 1.3 cm   IVS 1.180 cm   LVIDd 6.360 5.05 - 7.01 cm   LVIDD 6.36 cm   LVIDs 5.590 2.97 - 4.49 cm   LVPWd 1.190 0.65 - 1.21 cm   LVPWD 1.190 cm   LV Peak Diastolic Tissue Velocity During (A Sys Lat) 7.720 cm/s   MV E' Lateral Tissue Vel 7.720 cm/s   LV Peak Diastolic Tissue Velocity During (A Sys Med) 3.590 cm/s   MV E' Tissue Velocity Lateral 3.590 cm/s   Global Strain -3.300 %   Global Strain -4.500 %   Global Strain -6.000 %   Global Strain -4.600 %   A4C EF 31 %   Left Ventricular EF by 2-D Biplane by  Method of Disks 31.900 %   Left Ventricular EF by Teichholz Method 25.700 %   E/E' Lateral Ratio 12.500 no units   E/E' Ratio 26.80 no units   LVFS 12.100 %   Interventricular Septum/Left Ventricular PWD by 2D 0.992 no units   LV Stroke Volume 82.000 mL   LV Stroke Volume 53.000 mL   RA Major 5.780 cm   RA Volume 59.000 mL   AR PHT 437.000 ms   AoV Regurgitant PHT 437.000 ms   Aortic Regurgitation Peak Velocity 273.000 cm/s   Aortic root 4.10 cm   Aortic annulus 4.300 cm   Asc Aorta Diameter 3.400 cm   Ascending aorta 3.400 cm   IVC 2.91 cm   MR VTI 116.000 cm   Mitral valve mean inflow velocity 3.180 m/s   MR VTI 116.000 cm   MR Vel 426.000 cm/s   Mitral Regurgitant Peak Velocity 426.000 cm/s   MV DT 150.000 ms   E wave deceleration time 150.000 msec   MV PHT 44.000 ms   MV PHT 44.000 ms   MV A Velocity 56.600 cm/s   MV Peak A Vel 0.566 m/s   MV E Velocity 96.200 cm/s   MV Peak E Vel 96.200 cm/s   MV ERO 0.220 cm2   MVA (PHT) 5.000 cm2   Mitral Reguritant Flow 26,000.000 mm3   MV E/A 1.700 no units   Mitral  Valve Area by Proximal Isovelocity Surface Area 4.020 cm2   RVIDd 4.290 cm   RVDD 4.290 cm   RV AP4 Base 4.5 cm   RV AP4 Base 4.5 cm   TAPSE 2.140 cm   TR Peak Velocity 3.880 m/s   TR Peak Gradient 60.000 mmHg   ZLVPWD 1.52    ZLVIDS 3.37    ZLVIDD 0.66    ZIVSD 1.16    LA Area-Systolic (A2C) 27.7 cm2   LA ESV Index (A2C) 49.5    RA Pressure 5.0 mmHg   RVSP 65.00 mmHg   Narrative   .   Impression   Left Ventricle: Left ventricle is mildly dilated. Systolic function is  severely abnormal. EF: 27-30%. Contractility of septal segments are  relatively preserved, contractility of other segments of the left  ventricle is severely reduced. Doppler parameters consistent with moderate  diastolic dysfunction and elevated LA pressure. .  Aorta: The ascending aorta is at upper limit of normal in size- 3.4 cm.  The aortic root is mildly dilated - 4.1 cm. .  Mitral  Valve: Mitral valve structure is normal. The leaflets are mildly  thickened and exhibit normal excursion. There is mild to moderate  regurgitation with a centrally directed jet. EROA 0.22cm2. .  Aortic Valve: Trace aortic valve regurgitation. .  Tricuspid Valve: There is moderate regurgitation. The tricuspid valve  regurgitation jet is eccentric. TR jet velocity 3.7 m/sec. 54 The right  ventricular systolic pressure is severely elevated - about 62 mmHg  (>60  mmHg). .  Right Ventricle: Right ventricle size is normal. Systolic function is  mildly reduced. Normal tricuspid annular plane systolic excursion (TAPSE)  >1.7 cm. .  Left Atrium: Left atrium is mildly dilated at 4.200 cm. Left atrium  volume index is moderately increased (42-48 mL/m2). .  IVC/SVC: The inferior vena cava demonstrates a diameter of >2.1 cm and  collapses >50%; therefore, the right atrial pressure is estimated at 8  mmHg.  Compared to previous study from April 2025 LV systolic function is reduced  from previous 48 to 50% now 27 to 30%.  Compared to previous study from  April 2025 pulmonary hypertension has worsened significantly. By cardiac cath from May 2023 LVEF was 30 to 40%. By cardiac MRI from June 2023 LVEF was 40%.     Labs   Lab Results  Component Value Date/Time   Glucose 176 (H) 09/15/2023 11:27 AM   BUN 12 09/15/2023 11:27 AM   Creatinine 0.78 09/15/2023 11:27 AM   eGFR 90 09/15/2023 11:27 AM   BUN/CREAT RATIO 15.4 09/15/2023 11:27 AM   Na 136 09/15/2023 11:27 AM   Potassium 4.4 09/15/2023 11:27 AM   Cl 100 09/15/2023 11:27 AM   CO2 24 09/15/2023 11:27 AM   Total Protein 7.0 01/06/2022 10:33 AM   Albumin, Serum 4.5 01/06/2022 10:33 AM   Globulin, Total 2.5 01/06/2022 10:33 AM   Albumin/Globulin Ratio 1.8 01/06/2022 10:33 AM   Total Bilirubin 0.4 01/06/2022 10:33 AM   Alkaline Phosphatase 107 01/06/2022 10:33 AM   AST 20 01/06/2022 10:33 AM   ALT (SGPT) 20 01/06/2022 10:33 AM    Lab  Results  Component Value Date/Time   WBC 10.8 (H) 09/15/2023 11:27 AM   RBC 5.59 09/15/2023 11:27 AM   HGB 16.9 09/15/2023 11:27 AM   HCT 50.4 09/15/2023 11:27 AM   MCV 90.2 09/15/2023 11:27 AM   MCH 30.2 09/15/2023 11:27 AM   MCHC 33.5 09/15/2023 11:27 AM   Plt Ct  206 09/15/2023 11:27 AM   NEUTROPHIL % 69.4 09/15/2023 11:27 AM   LYMPHOCYTE % 19.5 09/15/2023 11:27 AM   Eosinophil % 1.0 09/15/2023 11:27 AM   Absolute Eosinophil Count 0.11 09/15/2023 11:27 AM   BASOPHIL % 0.5 09/15/2023 11:27 AM      Immunizations     No immunizations on file.         Patient's Medications       * Accurate as of June 18, 2024 12:10 PM. Reflects encounter med changes as of last refresh          New Prescriptions      Instructions  doxycycline hyclate 100 mg tablet Commonly known as: VIBRA-TABS Started by: Adnan Javaid  100 mg, Oral, 2 times a day   predniSONE  10 mg tablet Commonly known as: DELTASONE  Started by: Adnan Javaid  Take 4 tablets for 3 days, Take 3 tablets for 3 days, Take 2 tablets for 3 days, Take 1 tablet for 3 days       Continued Medications      Instructions  albuterol sulfate HFA 108 (90 Base) MCG/ACT inhaler Commonly known as: VENTOLIN HFA  2 puffs, Inhalation, Every 6 hours as needed, Inhale two puffs every four to six hours as needed.   aspirin EC tablet Commonly known as: ASPIRIN LOW DOSE  81 mg, Oral, Daily   B-complex with vitamin C tablet  1 tablet, Daily   cholecalciferol 1,000 units (25 mcg) tablet Commonly known as: VITAMIN D-3  1,000 Units, Daily   ezetimibe 10 MG tablet Commonly known as: ZETIA  10 mg, Oral, Daily   furosemide 20 mg tablet Commonly known as: LASIX  20 mg, Oral, Daily, Please, hold if systolic blood pressure is less than 105.   glipiZIDE  ER 10 mg 24 hr tablet Commonly known as: GLUCOTROL  XL  10 mg, Daily   JANUVIA  100 mg tablet Generic drug: sitaGLIPtin   Januvia  100 mg tablet   ketoconazole 2 %  cream Commonly known as: NIZORAL  Topical, Daily   lisinopril  2.5 mg tablet Commonly known as: PRINIVIL ,ZESTRIL   2.5 mg, Oral, Daily   * metformin  1000 MG tablet Commonly known as: GLUCOPHAGE   metformin  1,000 mg tablet  Take 1 tablet twice a day by oral route.   * metformin  1000 MG tablet Commonly known as: GLUCOPHAGE   1,000 mg, 2 times a day   metoprolol succinate 25 mg 24 hr tablet Commonly known as: TOPROL-XL  TAKE 1 TABLET 2 TIMES DAILY.PLEASE HOLD IF SYSTOLIC BP IS LESS THAN 105 OR HR IS LESS THAN 60   ONETOUCH DELICA PLUS LANCET30G Misc  USE AS DIRECTED EVERY 4 HOURS AS NEEDED   ONETOUCH ULTRA test strip Generic drug: glucose blood  1 strip, 2 times a day   pioglitazone  45 MG tablet Commonly known as: ACTOS   45 mg, Daily   potassium chloride 10 mEq crys ER tablet Commonly known as: KLOR-CON M10  10 mEq, Oral, Daily   pravastatin sodium 40 mg tablet Commonly known as: PRAVACHOL  40 mg, Oral, Every evening   sildenafil citrate 20 mg tablet Commonly known as: REVATIO    tadalafil 20 MG tablet Commonly known as: CIALIS  20 mg, Daily as needed   * ANDROGEL  PUMP 1.62% topical gel Generic drug: testosterone   Every 24 hours   * testosterone  50 mg/5 g Gel gel Commonly known as: ANDROGEL ,TESTIM     testosterone  cypionate 100 MG/ML injection Commonly known as: DEPOTESTOTERONE CYPIONATE  SMARTSIG:2 Milliliter(s) IM Once a Week  TRULICITY  3 MG/0.5ML Soaj injection Generic drug: dulaglutide   SMARTSIG:3 Milligram(s) SUB-Q   TURMERIC PO  1,000 mg, Daily   Zinc 50 MG Caps  50 mg, Daily      * * This list has 4 medication(s) that are the same as other medications prescribed for you. Read the directions carefully, and ask your doctor or other care provider to review them with you.          Modified Medications      Instructions  * Misc. Devices Misc What changed: Another medication with the same name was added. Make sure you understand how and when  to take each. Changed by: Adnan Javaid  Start CPAP at 14 cm. water pressure.  Prefer Resmed S11 CPAP machine with a mask and supplies for moderate OSA with AHI 18.  Mask of patient preference.  Send to a local DME.   * Misc. Devices Misc What changed: Another medication with the same name was added. Make sure you understand how and when to take each. Changed by: Adnan Javaid  New mask fitting for OSA on CPAP. Having increased air leaks and will not trim beard. Send to current DME.   * Misc. Devices Misc What changed: Another medication with the same name was added. Make sure you understand how and when to take each. Changed by: Adnan Javaid  Decrease pressure to 12 cm water pressure   * Misc. Devices Misc What changed: Another medication with the same name was added. Make sure you understand how and when to take each. Changed by: Adnan Javaid  Referral to [facility] for oral appliance evaluation. Moderate OSA. AHI 18.   * Misc. Devices Misc What changed: Another medication with the same name was added. Make sure you understand how and when to take each. Changed by: Adnan Javaid  Occlusal 30%, 30 mL's, apply twice a day to affected area   * Misc. Devices Misc What changed: You were already taking a medication with the same name, and this prescription was added. Make sure you understand how and when to take each. Changed by: Adnan Javaid  Overnight pulse oxygen    * Misc. Devices Misc What changed: You were already taking a medication with the same name, and this prescription was added. Make sure you understand how and when to take each. Changed by: Adnan Javaid  Referral for [facility] Dr. Carlie      * * This list has 7 medication(s) that are the same as other medications prescribed for you. Read the directions carefully, and ask your doctor or other care provider to review them with you.           The patient was given a copy of the after visit  summary.  Voice-recognition software was used in surveyor, minerals of this documentation. Unintended transcription errors may have escaped editorial review.          [1] Past Medical History: Diagnosis Date  . Arthritis   . Diabetes mellitus (*)   [2] History reviewed. No pertinent surgical history. [3] Social History Socioeconomic History  . Marital status: Married  Occupational History  . Occupation: Retired/Business Network Engineer  Tobacco Use  . Smoking status: Former    Current packs/day: 0.00    Average packs/day: 0.5 packs/day for 15.0 years (7.5 ttl pk-yrs)    Types: Cigarettes    Start date: 76    Quit date: 1970    Years since quitting: 55.9    Passive exposure: Past  .  Smokeless tobacco: Never  Vaping Use  . Vaping status: Never Used  Substance and Sexual Activity  . Alcohol use: Yes    Comment: a drink a day/ occassional  . Drug use: Never  . Sexual activity: Yes    Partners: Female  [4] No family history on file. [5] No Known Allergies *Some images could not be shown.

## 2024-06-19 DIAGNOSIS — E611 Iron deficiency: Secondary | ICD-10-CM | POA: Insufficient documentation

## 2024-06-19 DIAGNOSIS — D751 Secondary polycythemia: Secondary | ICD-10-CM | POA: Insufficient documentation

## 2024-06-20 ENCOUNTER — Inpatient Hospital Stay: Attending: Hematology | Admitting: Hematology

## 2024-06-20 DIAGNOSIS — D751 Secondary polycythemia: Secondary | ICD-10-CM

## 2024-06-20 DIAGNOSIS — E611 Iron deficiency: Secondary | ICD-10-CM | POA: Diagnosis not present

## 2024-06-20 NOTE — Progress Notes (Signed)
 [facility]   [phone] [phone]   Clinic Follow up Note   Patient Care Team: Roz Anes, MD as Consulting Physician (Ophthalmology) Kassie Mallick, MD (Inactive) as Consulting Physician (Endocrinology) Jordan, Peter M, MD as Consulting Physician (Cardiology) Avram Lupita BRAVO, MD as Consulting Physician (Gastroenterology) Chick Venetia BRAVO, MD as Consulting Physician (Sports Medicine) Nena Rosina LITTIE DEVONNA as Physician Assistant (Physician Assistant) 06/20/2024  I connected with Lynwood MATSU Svehla on 06/20/24 at  8:40 AM EST by telephone and verified that I am speaking with the correct person using two identifiers.   I discussed the limitations, risks, security and privacy concerns of performing an evaluation and management service by telephone and the availability of in person appointments. I also discussed with the patient that there may be a patient responsible charge related to this service. The patient expressed understanding and agreed to proceed.   Patient's location:  Home  Provider's location:  Office    CHIEF COMPLAINT: f/u lab   Assessment & Plan Iron deficiency without anemia  Very mild iron deficiency, likely secondary to recent blood donations. Iron indices have improved since October, with ferritin now within normal limits. Expected to recover spontaneously with cessation of blood donation and dietary measures. Iron supplementation not indicated unless desired. - Advised cessation of blood donation. - Recommended iron-rich diet and/or over-the-counter multivitamin with iron if desired, but not required. - Planned repeat iron studies in 3-6 months to confirm normalization.if he still has low iron in 6 months, additional workup especially endoscopy may be warranted. - Instructed to follow up with primary care provider for routine laboratory monitoring. - Advised to return to hematology/oncology as needed.  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 Polycythemia vera workup was negative. No evidence of primary hematologic disorder. No acute concerns related to erythrocytosis. - No further hematologic evaluation indicated at this time.  Plan - Lab reviewed, he has some mild low serum iron, ferritin back to normal, overall improved - Please repeat iron study in 6 months, patient would like to follow-up with his PCP - I will see him as needed in the future.   Discussed the use of AI scribe software for clinical note transcription with the patient, who gave verbal consent to proceed.  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  He was referred for evaluation of persistent erythrocytosis and mild iron deficiency. Labs from two weeks ago show mild iron deficiency with ferritin now normal and improved iron indices since October. He is not taking iron supplements and has not had recent transfusions. He had three therapeutic phlebotomies over the past several months for elevated hematocrit. He has not had a colonoscopy in several years.  He has fatigue but dyspnea has improved since starting pantoprazole from pulmonology. He has no other acute symptoms and otherwise feels well.     REVIEW OF SYSTEMS:   Constitutional: Denies fevers, chills or abnormal weight loss Eyes: Denies blurriness of vision Ears, nose, mouth, throat, and face: Denies mucositis or sore throat Respiratory: Denies cough, dyspnea or wheezes Cardiovascular: Denies palpitation, chest discomfort or lower extremity swelling Gastrointestinal:  Denies nausea, heartburn or change in bowel habits Skin: Denies abnormal skin rashes Lymphatics: Denies new  lymphadenopathy or easy bruising Neurological:Denies numbness, tingling or  new weaknesses Behavioral/Psych: Mood is stable, no new changes  All other systems were reviewed with the patient and are negative.  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  Stable cysts 03/31/17 MRI.   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  Has hearing aids AU.  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   Plantar fasciitis    deep tissue laser tx at Dr. Nydia (chiro) helped A LOT!   Polycystic kidney disease    Normal renal function as of 11/2015.  Stable on imaging 09/2016--recommended repeat in 6 mo.  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   Tachycardia 2017   Echo and event monitor negative.  No recurrence as of cardiology f/u 02/2016.     SURGICAL HISTORY: Past Surgical History:  Procedure Laterality Date   CIRCUMCISION  age 77   COLONOSCOPY W/ POLYPECTOMY  2009; 2014   09/2012: tubular adenoma--no high grade dysplasia.  Repeat 09/2017 approx.  Diverticulosis and small int hem   DENTAL SURGERY     10+ root canals, several bridges   EYE SURGERY     Right (s/p traumatic injury to eye)   TRANSTHORACIC ECHOCARDIOGRAM  07/16/08; 11/2015   EF 55-60%, mild LVH and RVH,  impaired relaxation.  2017 moderate LVH, EF 65-70%, grade I DD.   US  AORTA: SCREENING  11/2015   No aneurism but ectatic portion of aorta was noted; repeat u/s in 5 yrs recommended    I have reviewed the social history and family history with the patient and they are unchanged from previous note.  ALLERGIES:  has no known allergies.  MEDICATIONS:  Current Outpatient Medications  Medication Sig Dispense Refill   albuterol (VENTOLIN HFA) 108 (90 Base) MCG/ACT inhaler Inhale into the lungs.     aspirin EC 81 MG tablet TAKE 1 TABLET BY MOUTH EVERY DAY; Duration: 90     Blood Glucose Monitoring Suppl (ONE TOUCH ULTRA SYSTEM KIT) W/DEVICE KIT 1 kit by Does not apply route once. 100 each 3   Cholecalciferol (VITAMIN D3 PO) Take 25 mcg by mouth. 2000 iu     Cyanocobalamin (B-12) 1000 MCG TABS Take 1,000 mcg by mouth daily.     Dulaglutide  (TRULICITY ) 1.5 MG/0.5ML SOPN Inject 1.5 mg into the skin once a week. 6 mL 1   Dulaglutide  3 MG/0.5ML SOPN Inject 3 mg into the skin once a week. 6 mL 1   ezetimibe (ZETIA) 10 MG tablet 1 tablet Oral Once a day; Duration: 90 days     glipiZIDE  (GLUCOTROL  XL) 10 MG 24 hr tablet TAKE 1 TABLET (10 MG TOTAL) BY MOUTH DAILY WITH BREAKFAST. 30 tablet 0   glucose blood (ONETOUCH ULTRA) test strip USE AS DIRECTED TO TEST BLOOD  GLUCE TWICE DAILY 100 strip 11   ibuprofen (ADVIL,MOTRIN) 200 MG tablet Take 400 mg by mouth every 3 (three) hours as needed.     lisinopril  (ZESTRIL ) 2.5 MG tablet 1 tablet Oral Once a day; Duration: 90 days     Magnesium 500 MG TABS Take 2 tablets by mouth daily.      metFORMIN  (GLUCOPHAGE ) 1000 MG tablet Take 1 tablet (1,000 mg total) by mouth 2 (two) times daily with a meal. 180 tablet 3   metoprolol succinate (TOPROL-XL) 25 MG 24 hr tablet TAKE 1 TABLET BY MOUTH TWICE DAILY; Duration: 90 days HOLD IF SYSTOLIC BLOOD PRESSURE IS LESS THAN 105 OR HR IS LESS THAN 60     nystatin -triamcinolone  ointment (MYCOLOG) Apply topically 2 (two) times  daily. 30 g 2   Omega-3 Fatty Acids (FISH OIL) 1200 MG CAPS Take 1,200 mg by mouth 2 (two) times daily.     ONETOUCH DELICA LANCETS MISC Check glucose twice daily 60 each 5   pioglitazone  (ACTOS ) 45 MG tablet TAKE 1 TABLET BY MOUTH EVERY DAY 30 tablet 0   pravastatin (PRAVACHOL) 40 MG tablet 1 tablet Oral Once a day; Duration: 90 days     senna (SENOKOT) 8.6 MG TABS Take 2 tablets by mouth daily.       TURMERIC PO Take 1,000 mg by mouth daily.     Zinc 50 MG CAPS Take 50 mg by mouth daily.     No current facility-administered medications for this visit.    PHYSICAL EXAMINATION: Not performed   LABORATORY DATA:  I have reviewed the data as listed    Latest Ref Rng & Units 06/04/2024    1:42 PM 08/01/2020   11:11 AM 02/16/2019    8:26 AM  CBC  WBC 4.0 - 10.5 K/uL 10.9  8.2  8.0   Hemoglobin 13.0 - 17.0 g/dL 85.9  85.6  85.6   Hematocrit 39.0 - 52.0 % 45.3  41.4  41.8   Platelets 150 - 400 K/uL 238  146  205         Latest Ref Rng & Units 02/20/2021    2:31 PM 08/01/2020   11:11 AM 03/05/2020    9:14 AM  CMP  Glucose 65 - 99 mg/dL 796  657  876   BUN 7 - 25 mg/dL 20  10  16    Creatinine 0.70 - 1.28 mg/dL 9.19  9.27  9.23   Sodium 135 - 146 mmol/L 139  137  138   Potassium 3.5 - 5.3 mmol/L 4.1  4.2  4.5   Chloride 98 - 110 mmol/L 102  98  103   CO2 20 - 32 mmol/L 31  28  25    Calcium 8.6 - 10.3 mg/dL 9.4  9.2  9.0       RADIOGRAPHIC STUDIES: I have personally reviewed the radiological images as listed and agreed with the findings in the report. No results found.     I discussed the assessment and treatment plan with the patient. The patient was provided an opportunity to ask questions and all were answered. The patient agreed with the plan and demonstrated an understanding of the instructions.   The patient was advised to call back or seek an in-person evaluation if the symptoms worsen or if the condition fails to improve as anticipated.  I provided 15 minutes of non  face-to-face telephone visit time during this encounter, including review of chart and various tests results, discussions about plan of care  and coordination of care plan.    Onita Mattock, MD 06/20/24
